# Patient Record
Sex: Female | Born: 1965
Health system: Southern US, Community
[De-identification: ages and names within clinical notes are randomized; demographics above are authoritative.]

## PROBLEM LIST (undated history)

## (undated) DIAGNOSIS — R42 Dizziness and giddiness: Secondary | ICD-10-CM

## (undated) DIAGNOSIS — K219 Gastro-esophageal reflux disease without esophagitis: Secondary | ICD-10-CM

## (undated) DIAGNOSIS — M1990 Unspecified osteoarthritis, unspecified site: Secondary | ICD-10-CM

## (undated) DIAGNOSIS — R112 Nausea with vomiting, unspecified: Secondary | ICD-10-CM

## (undated) DIAGNOSIS — E739 Lactose intolerance, unspecified: Secondary | ICD-10-CM

## (undated) DIAGNOSIS — R634 Abnormal weight loss: Secondary | ICD-10-CM

## (undated) DIAGNOSIS — K5900 Constipation, unspecified: Secondary | ICD-10-CM

## (undated) DIAGNOSIS — E7800 Pure hypercholesterolemia, unspecified: Secondary | ICD-10-CM

## (undated) DIAGNOSIS — K589 Irritable bowel syndrome without diarrhea: Secondary | ICD-10-CM

## (undated) DIAGNOSIS — T7840XA Allergy, unspecified, initial encounter: Secondary | ICD-10-CM

## (undated) DIAGNOSIS — G43909 Migraine, unspecified, not intractable, without status migrainosus: Secondary | ICD-10-CM

## (undated) DIAGNOSIS — F32A Depression, unspecified: Secondary | ICD-10-CM

## (undated) DIAGNOSIS — R7303 Prediabetes: Secondary | ICD-10-CM

## (undated) DIAGNOSIS — R12 Heartburn: Secondary | ICD-10-CM

## (undated) DIAGNOSIS — M549 Dorsalgia, unspecified: Secondary | ICD-10-CM

## (undated) DIAGNOSIS — Z9889 Other specified postprocedural states: Secondary | ICD-10-CM

## (undated) DIAGNOSIS — K259 Gastric ulcer, unspecified as acute or chronic, without hemorrhage or perforation: Secondary | ICD-10-CM

## (undated) DIAGNOSIS — F419 Anxiety disorder, unspecified: Secondary | ICD-10-CM

## (undated) DIAGNOSIS — K3184 Gastroparesis: Secondary | ICD-10-CM

## (undated) HISTORY — DX: Heartburn: R12

## (undated) HISTORY — DX: Unspecified osteoarthritis, unspecified site: M19.90

## (undated) HISTORY — DX: Irritable bowel syndrome, unspecified: K58.9

## (undated) HISTORY — DX: Lactose intolerance, unspecified: E73.9

## (undated) HISTORY — DX: Prediabetes: R73.03

## (undated) HISTORY — DX: Gastro-esophageal reflux disease without esophagitis: K21.9

## (undated) HISTORY — DX: Allergy, unspecified, initial encounter: T78.40XA

## (undated) HISTORY — PX: BACK SURGERY: SHX140

## (undated) HISTORY — PX: TONSILLECTOMY: SUR1361

## (undated) HISTORY — PX: ABDOMINAL HYSTERECTOMY: SHX81

## (undated) HISTORY — DX: Anxiety disorder, unspecified: F41.9

## (undated) HISTORY — DX: Dorsalgia, unspecified: M54.9

## (undated) HISTORY — DX: Depression, unspecified: F32.A

## (undated) HISTORY — DX: Migraine, unspecified, not intractable, without status migrainosus: G43.909

## (undated) HISTORY — DX: Pure hypercholesterolemia, unspecified: E78.00

## (undated) HISTORY — DX: Constipation, unspecified: K59.00

## (undated) HISTORY — DX: Gastroparesis: K31.84

## (undated) HISTORY — DX: Gastric ulcer, unspecified as acute or chronic, without hemorrhage or perforation: K25.9

## (undated) HISTORY — PX: KNEE ARTHROSCOPY W/ ACL RECONSTRUCTION: SHX1858

## (undated) HISTORY — PX: OTHER SURGICAL HISTORY: SHX169

## (undated) HISTORY — PX: COSMETIC SURGERY: SHX468

---

## 1998-01-04 ENCOUNTER — Ambulatory Visit (HOSPITAL_COMMUNITY): Admission: RE | Admit: 1998-01-04 | Discharge: 1998-01-04 | Payer: Self-pay | Admitting: Obstetrics and Gynecology

## 1998-07-28 ENCOUNTER — Ambulatory Visit (HOSPITAL_COMMUNITY): Admission: RE | Admit: 1998-07-28 | Discharge: 1998-07-28 | Payer: Self-pay | Admitting: Internal Medicine

## 1998-07-28 ENCOUNTER — Encounter: Payer: Self-pay | Admitting: Internal Medicine

## 1999-07-17 ENCOUNTER — Encounter: Payer: Self-pay | Admitting: Emergency Medicine

## 1999-07-17 ENCOUNTER — Emergency Department (HOSPITAL_COMMUNITY): Admission: EM | Admit: 1999-07-17 | Discharge: 1999-07-17 | Payer: Self-pay | Admitting: Emergency Medicine

## 1999-09-22 ENCOUNTER — Other Ambulatory Visit: Admission: RE | Admit: 1999-09-22 | Discharge: 1999-09-22 | Payer: Self-pay | Admitting: Obstetrics and Gynecology

## 2000-07-29 ENCOUNTER — Other Ambulatory Visit: Admission: RE | Admit: 2000-07-29 | Discharge: 2000-07-29 | Payer: Self-pay | Admitting: Obstetrics and Gynecology

## 2000-08-23 ENCOUNTER — Encounter: Payer: Self-pay | Admitting: Obstetrics & Gynecology

## 2000-08-23 ENCOUNTER — Ambulatory Visit (HOSPITAL_COMMUNITY): Admission: RE | Admit: 2000-08-23 | Discharge: 2000-08-23 | Payer: Self-pay | Admitting: Obstetrics & Gynecology

## 2000-11-15 ENCOUNTER — Encounter: Payer: Self-pay | Admitting: Obstetrics and Gynecology

## 2000-11-15 ENCOUNTER — Inpatient Hospital Stay (HOSPITAL_COMMUNITY): Admission: AD | Admit: 2000-11-15 | Discharge: 2000-11-15 | Payer: Self-pay | Admitting: Obstetrics and Gynecology

## 2001-01-03 ENCOUNTER — Encounter: Payer: Self-pay | Admitting: *Deleted

## 2001-01-03 ENCOUNTER — Inpatient Hospital Stay (HOSPITAL_COMMUNITY): Admission: AD | Admit: 2001-01-03 | Discharge: 2001-01-03 | Payer: Self-pay | Admitting: *Deleted

## 2001-02-13 ENCOUNTER — Encounter (INDEPENDENT_AMBULATORY_CARE_PROVIDER_SITE_OTHER): Payer: Self-pay | Admitting: Specialist

## 2001-02-13 ENCOUNTER — Inpatient Hospital Stay (HOSPITAL_COMMUNITY): Admission: AD | Admit: 2001-02-13 | Discharge: 2001-02-17 | Payer: Self-pay | Admitting: Obstetrics and Gynecology

## 2001-02-18 ENCOUNTER — Encounter: Admission: RE | Admit: 2001-02-18 | Discharge: 2001-02-27 | Payer: Self-pay | Admitting: Obstetrics and Gynecology

## 2001-03-22 ENCOUNTER — Other Ambulatory Visit: Admission: RE | Admit: 2001-03-22 | Discharge: 2001-03-22 | Payer: Self-pay | Admitting: Obstetrics and Gynecology

## 2002-03-07 ENCOUNTER — Encounter: Admission: RE | Admit: 2002-03-07 | Discharge: 2002-03-07 | Payer: Self-pay | Admitting: Internal Medicine

## 2002-03-07 ENCOUNTER — Encounter: Payer: Self-pay | Admitting: Internal Medicine

## 2002-04-10 ENCOUNTER — Other Ambulatory Visit: Admission: RE | Admit: 2002-04-10 | Discharge: 2002-04-10 | Payer: Self-pay | Admitting: Obstetrics and Gynecology

## 2002-07-12 ENCOUNTER — Encounter (INDEPENDENT_AMBULATORY_CARE_PROVIDER_SITE_OTHER): Payer: Self-pay | Admitting: Specialist

## 2002-07-12 ENCOUNTER — Ambulatory Visit (HOSPITAL_COMMUNITY): Admission: RE | Admit: 2002-07-12 | Discharge: 2002-07-12 | Payer: Self-pay | Admitting: Obstetrics and Gynecology

## 2003-06-03 ENCOUNTER — Other Ambulatory Visit: Admission: RE | Admit: 2003-06-03 | Discharge: 2003-06-03 | Payer: Self-pay | Admitting: Obstetrics and Gynecology

## 2003-06-05 ENCOUNTER — Encounter (INDEPENDENT_AMBULATORY_CARE_PROVIDER_SITE_OTHER): Payer: Self-pay

## 2003-06-06 ENCOUNTER — Inpatient Hospital Stay (HOSPITAL_COMMUNITY): Admission: RE | Admit: 2003-06-06 | Discharge: 2003-06-07 | Payer: Self-pay | Admitting: Obstetrics and Gynecology

## 2004-06-16 ENCOUNTER — Other Ambulatory Visit: Admission: RE | Admit: 2004-06-16 | Discharge: 2004-06-16 | Payer: Self-pay | Admitting: Obstetrics and Gynecology

## 2004-10-06 ENCOUNTER — Ambulatory Visit (HOSPITAL_BASED_OUTPATIENT_CLINIC_OR_DEPARTMENT_OTHER): Admission: RE | Admit: 2004-10-06 | Discharge: 2004-10-06 | Payer: Self-pay | Admitting: Orthopedic Surgery

## 2005-08-20 ENCOUNTER — Other Ambulatory Visit: Admission: RE | Admit: 2005-08-20 | Discharge: 2005-08-20 | Payer: Self-pay | Admitting: Obstetrics and Gynecology

## 2010-07-14 ENCOUNTER — Ambulatory Visit (HOSPITAL_BASED_OUTPATIENT_CLINIC_OR_DEPARTMENT_OTHER): Admission: RE | Admit: 2010-07-14 | Discharge: 2010-07-14 | Payer: Self-pay | Admitting: Orthopedic Surgery

## 2010-12-24 LAB — POCT HEMOGLOBIN-HEMACUE: Hemoglobin: 13.9 g/dL (ref 12.0–15.0)

## 2011-02-26 NOTE — Discharge Summary (Signed)
   [name], BECKA                          ACCOUNT NO.:  [id number]   MEDICAL RECORD NO.:  [id number]                   PATIENT TYPE:  INP   LOCATION:  9135                                 FACILITY:  [facility]   PHYSICIAN:  Juluis Mire, M.D.                DATE OF BIRTH:  January 04, 1966   DATE OF ADMISSION:  06/05/2003  DATE OF DISCHARGE:  06/07/2003                                 DISCHARGE SUMMARY   ADMITTING DIAGNOSES:  Uterine adenomyosis.   DISCHARGE DIAGNOSES:  Uterine adenomyosis with pathology pending.   OPERATIVE PROCEDURE:  Laparoscopically assisted vaginal hysterectomy.   HISTORY:  For complete history and physical please see dictated note.   HOSPITAL COURSE:  The patient underwent laparoscopic assisted vaginal  hysterectomy.  Pathology is still pending at the present time.  Postoperatively her postoperative hemoglobin was 10.7.  She did have nausea  on her first postoperative day that limited her p.o. intake somewhat.  We  watched her that day and despite continued observation she continued to be  slightly nauseated.  We decided we would keep her overnight to follow her GI  function.  The following morning her nausea had resolved.  She was  tolerating her diet and voiding without difficulty.  Her abdomen was soft.  Bowel sounds were active.  She was actually passing flatus.  The  subumbilical and suprapubic incisions were intact.  She had no active  vaginal bleeding.  She was discharged home at that time.   COMPLICATIONS:  None encountered during stay in the hospital.  The patient  was discharged home in stable condition.   DISPOSITION:  Routine postoperative instructions were given.  She is to  avoid heavy lifting, vaginal entrance, or driving a car.  Discharged home on  Demerol as she needs for pain.  She is to call with signs of infection,  nausea, vomiting, increasing abdominal pain, or active vaginal bleeding.  Follow-up in the office will be in one week.                                               Juluis Mire, M.D.    JSM/MEDQ  D:  06/07/2003  T:  06/07/2003  Job:  [id number]

## 2011-02-26 NOTE — H&P (Signed)
NAMEBERNEICE, [name]                          ACCOUNT NO.:  [id number]   MEDICAL RECORD NO.:  [id number]                   PATIENT TYPE:  AMB   LOCATION:  SDC                                  FACILITY:  [facility]   PHYSICIAN:  Juluis Mire, M.D.                DATE OF BIRTH:  1966/02/11   DATE OF ADMISSION:  07/12/2002  DATE OF DISCHARGE:                                HISTORY & PHYSICAL   HISTORY:  The patient is a 45 year old gravida 2, para 2, married white  female who presents for diagnostic laparoscopy with laser standby and  bilateral tubal ligation.  Subsequent hysteroscopy with endometrial  cryoablation.   IN RELATION TO THE PRESENT ADMISSION:  The patient is desirous of permanent  sterilization.  We have discussed alternatives for birth control. The  potential irreversibility of sterilization was discussed.  A failure rate of  1 in 200 was quoted.  This can be in the form of ectopic pregnancy requiring  further surgical management.   The patient has also been having trouble with her menstrual flow.  She has  five days of flow, two days being heavy changing tampons and pads every hour  with clots and increasing dysmenorrhea associated with this.  There is some  pain during deep penetration with intercourse.  She does have a past history  of endometriosis for which she underwent a laparoscopic evaluation in 1999.  She also underwent six months of Lupron therapy.  She underwent a saline  infusion in the office.  There were no endometrial polyps or fibroids noted.  Both ovaries appeared to be normal.  In view of this, we discussed options  including hormonal management with birth control pills versus endometrial  ablation versus hysterectomy.  The patient is going to proceed with  endometrial ablation. At the time of laparoscopy will evaluate for any  recurrent endometriosis.   ALLERGIES:  The patient is allergic to CODEINE.   MEDICATIONS:  Maxalt for migraines.   PAST  MEDICAL HISTORY:  Usual childhood diseases.  No significant sequelae.  Does have a history of migraines.   PAST SURGICAL HISTORY:  As noted in 1999, she underwent hysteroscopy and  laparoscopy upon amendable pelvic endometriosis.  In 1999, she had back  surgery.  In 1985 she had a tonsillectomy.   OBSTETRICAL HISTORY:  She has had one spontaneous vaginal delivery and one  low transverse cesarean section.   FAMILY HISTORY:  Noncontributory.   SOCIAL HISTORY:  No tobacco or alcohol use.   REVIEW OF SYSTEMS:  Noncontributory.   PHYSICAL EXAMINATION:  The patient is afebrile.  Stable vital signs.  HEENT:  The patient is normocephalic.  Pupils equal, round, reactive to  light and accommodation.  Extraocular movements are intact.  Sclerae and  conjunctiva are clear.  Oropharynx clear.  NECK:  Without thyromegaly.  BREASTS:  No discrete masses.  LUNGS:  Clear.  CARDIAC SYSTEMS:  Regular rate and rhythm without murmurs or gallops.  ABDOMINAL EXAM:  Benign.  No masses or organomegaly or tenderness.  PELVIC:  Normal external genitalia.  Vaginal mucosa was clear.  Cervix  unremarkable.  Uterus normal size, shape and contour.  Adnexa free of masses  or tenderness.  RECTOVAGINAL EXAM:  Clear.  EXTREMITIES:  Trace edema.  NEUROLOGIC EXAM:  Grossly within normal limits.   IMPRESSION:  1. Multiparity desirous of sterility.  2. Menorrhagia and dysmenorrhea.  3. History of endometriosis.   PLAN:  The patient is to undergo laparoscopy with bilateral tubal ligation.  Laser will be made available to treat any active endometriosis.  Will then  do hysteroscopy and subsequent endometrial ablation.  Success rate for  endometrial ablation at 70% have been discussed.  The risk of surgery have  been explained including the risk of anesthesia, the risk of an infection,  the risk of an infection, the risk of hemorrhage necessitating transfusion  with the risk of  hepatitis, risk of injury to adjacent  organs could require  exploratory surgery, risk of deep venous thrombosis and pulmonary embolus.  The patient understands indications, risk and alternatives.                                               Juluis Mire, M.D.    JSM/MEDQ  D:  07/12/2002  T:  07/12/2002  Job:  [id number]

## 2011-02-26 NOTE — Op Note (Signed)
[name], FAHS                          ACCOUNT NO.:  [id number]   MEDICAL RECORD NO.:  [id number]                   PATIENT TYPE:  AMB   LOCATION:  SDC                                  FACILITY:  [facility]   PHYSICIAN:  Juluis Mire, M.D.                DATE OF BIRTH:  1966/09/16   DATE OF PROCEDURE:  07/12/2002  DATE OF DISCHARGE:                                 OPERATIVE REPORT   PREOPERATIVE DIAGNOSES:  1. Menorrhagia with dysmenorrhea.  2. Multiparity, desires sterility.   POSTOPERATIVE DIAGNOSES:  1. Menorrhagia with dysmenorrhea.  2. Multiparity, desires sterility.  3. Endometriosis.   OPERATIVE PROCEDURES:  1. Open laparoscopy.  2. Bilateral tubal fulguration.  3. Cautery of endometriotic implants.  4. Cervical dilation.  5. Hysteroscopy with multiple endometrial biopsies.  6. Endometrial cryoablation.   SURGEON:  Juluis Mire, M.D.   ANESTHESIA:  General endotracheal.   ESTIMATED BLOOD LOSS:  Minimal.   PACKS AND DRAINS:  None.   INTRAOPERATIVE BLOOD REPLACEMENT:  None.   COMPLICATIONS:  None.   INDICATIONS FOR PROCEDURE:  Dictated in history and physical.   PROCEDURE AS FOLLOWS:  The patient was taken to the OR, placed in supine  position.  After satisfactory level of  general endotracheal anesthesia was  obtained, the patient was placed in the dorsal lithotomy position using the  Allen stirrups.  The abdomen, perineum, and vagina were prepped out with  Betadine.  The bladder was emptied by catheterization.  A Hulka tenaculum  was put in place and secured.  The patient draped out for laparoscopy.  A  subumbilical incision made with a knife.  The incision was extended to  subcutaneous tissue.  The fascia was identified and entered sharply and the  incision was extended laterally.  Lateral sutures of 0 Vicryl were put in  place and held into the fascia.  The peritoneum was entered.  The inflatable  open laparoscopy was put in place.  The balloon was  inflated with saline and  secured against the skin.  This is also secured with the held sutures.  The  laparoscope was introduced.  There was no evidence of injury to adjacent  organs.  A 5-mm trocar was put in place in the suprapubic area under direct  visualization.  The uterus was upper limits of normal size.  Tubes and  ovaries were unremarkable.  She did have some implants of endometriosis on  the posterior aspect of the uterus.  These were whitish lesions.  There was  no cul-de-sac lesions or adhesions.  Both ovaries were uninvolved.  We could  not see the appendix, thought it may be retrocecal.  The upper abdomen  including liver and tip of the gallbladder was clear.  Using the bipolar,  first areas of endometriosis were ablated on the back of the uterus.  Both  tubes were coagulated for a distance of  2 cm.  Coagulation was continued  until resistance read 0 on the meter.  The same segment of tube was then  recoagulated, completely desiccating the tube.  The coagulation did extend  out to the mesosalpinx.  At this point in time, both tubes were adequately  coagulated along with endometriosis.  There was no evidence of injury to  adjacent organs.  The laparoscope and trocars were removed.  The  subumbilical fascia was closed with two figure-of-eights of 0 Vicryl.  Skin  was closed with interrupted subcuticular of 4-0 Vicryl.  The suprapubic  incision was closed with Steri-Strips.   At this point in time, we proceeded with hysteroscopy.  The Hulka tenaculum  was then removed.  The patient's legs were repositioned.  Speculum was  placed in the vaginal vault.  The cervix was grasped with a single-tooth  tenaculum.  The uterus was dilated to approximately a size 35 Pratt dilator.  The hysteroscope was introduced.  The intrauterine cavity was distended  using Sorbitol.  There was absolutely no evidence of any endometrial  pathology.  No polyps, fibroids, irregularities.  Multiple biopsies  were  obtained and sent for pathological review.  The hysteroscope was then  removed.  Next, we used the cryogen endometrial ablating technique. The  probe was first inserted on the left side of the uterus and frozen for six  minutes.  It was then removed and repositioned on the right side of the  uterus and frozen for six minutes.  I felt that this was adequate as she did  not have a markedly large intrauterine cavity.  She did tolerate this well.  At this point in time, the single-tooth tenaculum was inspected and removed.  The patient taken out of the dorsal lithotomy position.  Once alert and  extubated, transferred to the recovery room in good condition.  Sponge,  instrument, and needle counts reported as correct per circulating nurse x2.                                                Juluis Mire, M.D.    JSM/MEDQ  D:  07/12/2002  T:  07/12/2002  Job:  [id number]

## 2011-02-26 NOTE — Op Note (Signed)
NAMEALEXANDER, MCAULEY                ACCOUNT NO.:  [id number]   MEDICAL RECORD NO.:  [id number]          PATIENT TYPE:  AMB   LOCATION:  DSC                          FACILITY:  [facility]   PHYSICIAN:  Nadara Mustard, MD     DATE OF BIRTH:  04-25-1966   DATE OF PROCEDURE:  10/06/2004  DATE OF DISCHARGE:                                 OPERATIVE REPORT   PREOPERATIVE DIAGNOSIS:  Lisfranc fracture dislocation of right foot.   POSTOPERATIVE DIAGNOSIS:  Lisfranc fracture dislocation of right foot.   PROCEDURES:  Open reduction and internal fixation of right Lisfranc fracture  dislocation.   SURGEON:  Nadara Mustard, M.D.   ANESTHESIA:  Popliteal block plus general.   ESTIMATED BLOOD LOSS:  Minimal.   ANTIBIOTICS:  1 g of Kefzol.   TOURNIQUET TIME:  Esmarch to the ankle for approximately 30 minutes.   DISPOSITION:  To PACU in stable condition.   INDICATIONS FOR PROCEDURE:  The patient is a 45 year old woman who has had a  fracture at the base of the second and third metatarsals, as well as a  displacement of the Lisfranc joint and first metatarsal and medial  cuneiforme.  She has failed conservative care and presents at this time for  surgical intervention.  The risks and benefits were discussed, including  infection, neurovascular injury, persistent pain and need for additional  surgery.  The patient states she understands and wishes to proceed at this  time.   DESCRIPTION OF PROCEDURE:  The patient underwent a popliteal block and then  was brought to OR room #2.  The patient then underwent general anesthetic.  After adequate levels of anesthesia were obtained, the patient's right lower  extremity was prepped using Duraprep and draped into a sterile field.  The  leg was elevated and esmarch was wrapped around the ankle for tourniquet  control.  A dorsal incision was made over the Lisfranc joint.  Blunt  dissection was carried down to the Lisfranc joint.  The fibrinous tissue was  debrided from the Lisfranc joint.  The first metatarsal medial cuneiforme  joint was reduced and held stable with a tenaculum.  A 2.5 mm drill bit was  then used to stabilize the first metatarsal medial cuneiforme joint from  dorsal proximal to distal plantar.  The screw was placed and C-arm  fluoroscopy verified reduction.  A second screw was placed transverse from  the middle to medial cuneiforme to stabilize the middle medial cuneiforme  joint.  A third screw was then placed from medial proximal at the medial  cuneiforme to distal lateral over the second metatarsal to recreate the  Lisfranc ligament disruption.  Screws were stabilized.  C-arm fluoroscopy  verified reduction in both AP and lateral planes.  The wound was irrigated  with normal saline.  The Esmarch was released and hemostasis was obtained.  The incision was closed  using 3-0 nylon with a far-near near-far suture.  The wounds were covered  with Adaptic orthopedic sponges, Webril and a Coban dressing.  The patient  was extubated and taken to the PACU in  stable condition.  The plan is for  discharge to home.  Prescription for [facility] on the chart.  Plan to  follow up in the office in two weeks.      Vernia Buff   MVD/MEDQ  D:  10/06/2004  T:  10/06/2004  Job:  [id number]

## 2011-02-26 NOTE — Discharge Summary (Signed)
[facility]  Patient:    [name], [name]                       MRN: [id number] Adm. Date:  [id number] Disc. Date: 02/17/01 Attending:  Trevor Iha Dictator:   Danie Chandler, R.N.                           Discharge Summary  ADMITTING DIAGNOSES:          1. Intrauterine pregnancy at [redacted] weeks                                  gestation.                               2. Prolonged rupture of membranes.  DISCHARGE DIAGNOSES:          1. Intrauterine pregnancy at [redacted] weeks                                  gestation.                               2. Prolonged rupture of membranes.                               3. Fetal intolerance to labor.                               4. Cord presentation.  PROCEDURE:                    On Feb 14, 2001: Primary low segment transverse                               cesarean section.  REASON FOR ADMISSION:         The patient is a 45 year old, married white female, gravida 2, para 1 at 45 weeks who presented with rupture of membranes before 9 oclock on the morning of Feb 13, 2001. She presented to the office that afternoon where gross rupture of membranes was confirmed. About 6 oclock that evening, the patient was begun on Pitocin because of no sign of contractions. By around 10 oclock, the patient began having repetitive late decelerations which responded to discontinuing the Pitocin, oxygen, and IV fluids. The patient made it to 3-4 cm, 75%, -3 station. Because of the fetal intolerance to labor, the decision was made to proceed with a primary low segment transverse cesarean section.  HOSPITAL COURSE:              The patient was taken to the operating room and underwent the above-named procedure without complication. This was productive of a viable female infant with Apgars of 8 at one minute and 9 at five minutes. There was a loop of cord preceding the fetal head just lateral to the occiput consistent with a cord  presentation.  On postoperative day #1, the patients hemoglobin was 10.7, hematocrit 31.5, and white blood cell  count 14.1. She had a good return of bowel function on this day and was tolerating a regular diet. She also had good pain control with PCA.  On postoperative day #2, the patient was ambulating well without difficulty. She was taking Demerol p.o. for pain, and she was discharged home on postoperative day #4.  CONDITION ON DISCHARGE:       Good.  DIET:                         Regular as tolerated.  ACTIVITY:                     No heavy lifting, no driving, no vaginal entry.  FOLLOWUP:                     She is to follow up in the office in one to two weeks for incision check, and she is to call for temperature greater than 100 degrees, persistent nausea or vomiting, heavy vaginal bleeding, and/or redness or drainage from the incision site.  DISCHARGE MEDICATIONS:        1. Prenatal vitamin one p.o. q.d.                               2. Demerol 50 mg one to two p.o. q.4h.                                  p.r.n. pain. Dispense #30 with no refills. DD:  02/17/01 TD:  02/17/01 Job: [id number]

## 2011-02-26 NOTE — Op Note (Signed)
[facility]  Patient:    [name], [name]                       MRN: [id number] Proc. Date: 02/14/01 Adm. Date:  [id number] Attending:  Trevor Iha                           Operative Report  PREOPERATIVE DIAGNOSIS:       Intrauterine pregnancy at 39 weeks, premature                               rupture of membranes, and fetal intolerance to                               labor.  POSTOPERATIVE DIAGNOSIS:      Intrauterine pregnancy at 39 weeks, premature                               rupture of membranes, and fetal intolerance to                               labor, plus cord presentation.  OPERATION:                    Primary low segment transverse cesarean section.  SURGEON:                      Trevor Iha, M.D.  ANESTHESIA:                   Epidural.  INDICATIONS:                  The patient is a 45 year old gravida 2, para 1, at 19 weeks who presents with rupture of membranes before 9 oclock this morning.  She presented to the office around 4:30 this afternoon, confirmed gross rupture of fluids.  Around 6 p.m. was begun on Pitocin  because of no sign of contractions.  By around 10 oclock, she began having repetitive late decelerations which responded to discontinuing the Pitocin, O2, and IV fluids. She made it to 3-4 cm, 75%, -3 station.  Because of fetal intolerance to labor, proceeded with a primary low segment transverse cesarean section.  The risks and benefits were discussed at length.  Informed consent was obtained.  FINDINGS AT TIME OF SURGERY:  Viable female infant.  Apgars of 8 and 9. Weight was 8 pounds and 3 ounces.  The pH is currently pending.  Loop of cord preceding the fetal head just lateral to the occiput consistent with a cord presentation.  DESCRIPTION OF PROCEDURE:     After adequate analgesia, the patient was placed in the supine position with left lateral tilt.  She was sterilely prepped and draped.   The bladder was sterilely drained with the Foley catheter.  A Pfannenstiel skin incision was made two fingerbreadths above the pubic symphysis which was taken down sharply.  The fascia was incised transversely. It was extended superiorly and inferiorly after the bellies of the rectus muscle were separated sharply in the midline.  The peritoneum was entered sharply.  The uterine serosa was elevated, nicked, and incised transversely. A bladder  flap was created, replaced by the bladder blade.  A low celiomyotomy incision was made down to the infants vertex and extended laterally with the operators fingertips.  The infants vertex was delivered atraumatically.  The nares and pharynx suctioned.  The infant was then delivered.  The cord was clamped and cut, and handed to the pediatricians.  Cord blood was obtained. The placenta was extracted manually.  The uterus was exteriorized, wiped clean with the dry lap.  The myotomy incision was closed in two layers, the first being a running locking layer and the second being an imbricating layer of 0 Monocryl.  After adequate hemostasis was achieved, normal uterus, tubes, and ovaries were noted.  The uterus was placed back into the peritoneal cavity and after a copious amount of irrigation and adequate hemostasis was assured, 0 Monocryl was used to close the peritoneum and plicate the rectus muscles in the midline.  Irrigation was applied and after adequate hemostasis, the fascia was closed in a single layer with 0 Panacryl.  Irrigation was once again applied, and after adequate hemostasis, skin staples and Steri-Strips applied.  The patient tolerated the procedure well, was stable, and transferred to the recovery room.  Sponge, needle, and instrument count was normal x 3.  The patient received 1 g of cefotetan after delivery of the placenta.  Estimated blood loss 800 cc. DD:  02/14/01 TD:  02/14/01 Job: [id number]

## 2011-02-26 NOTE — H&P (Signed)
[name], [name]                          ACCOUNT NO.:  [id number]   MEDICAL RECORD NO.:  [id number]                   PATIENT TYPE:  OBV   LOCATION:  9198                                 FACILITY:  [facility]   PHYSICIAN:  Juluis Mire, M.D.                DATE OF BIRTH:  [birth year]   DATE OF ADMISSION:  06/05/2003  DATE OF DISCHARGE:                                HISTORY & PHYSICAL   HISTORY OF PRESENT ILLNESS:  The patient is a 45 year old, gravida 2, para  2, married white female who presents for laparoscopically-assisted vaginal  hysterectomy.   In relation to the present admission, the patient had been having trouble  with her menstrual flow.  She describes five days of flow, two days of being  heavy, changing pads and tampons every hour with clots and increasing  dysmenorrhea.  Associated with this is increasing pain and discomfort,  particularly during intercourse.   She has had a past history of endometriosis for which she underwent  laparoscopic evaluation in 1999.  She has subsequently been treated with  Lupron therapy.  Subsequently in October 2003, she underwent diagnostic  laparoscopy with bilateral tubal ligation, hysteroscopy and cryoablation of  the endometrium.  At the time of laparoscopy, there was still some  superficial implants of endometriosis and overall uterine enlargement,  possibly consistent with adenomyosis.  Despite the treatment, she continued  to have significant menometrorrhagia that was incapacitating and the patient  now presents for definitive therapy in the form of laparoscopically-assisted  vaginal hysterectomy.  She has tried birth control pills in the past, had to  discontinue these because of side effects.  We have offered her other  options including repeat cryoablation.   ALLERGIES:  She is allergic to CODEINE.   MEDICATIONS:  None at the present time.   PAST MEDICAL HISTORY:  Usual childhood diseases; no significant sequelae;  does  have a history of migraines.   PAST SURGICAL HISTORY:  She underwent laparoscopy in 1999.  She had back  surgery in 1999.  In 1985, she had a tonsillectomy and as noted above in  2003, she underwent laparoscopy, bilateral tubal ligation, hysteroscopy, and  cryoablation.   OBSTETRICAL HISTORY:  She has had one spontaneous vaginal delivery and one  low-transverse cesarean section.   FAMILY HISTORY:  Noncontributory.   SOCIAL HISTORY:  No tobacco or alcohol use.   REVIEW OF SYSTEMS:  Noncontributory.   PHYSICAL EXAMINATION:  GENERAL:  The patient is afebrile with stable vital  signs.  HEENT:  Normocephalic.  Pupils equal, round and reactive to light and  accomodation.  Extraocular movements are intact.  Sclerae and conjunctivae  are clear.  Oropharynx clear.  NECK:  Without thyromegaly.  BREASTS:  No discrete masses.  LUNGS:  Clear.  CARDIOVASCULAR:  Regular rate; no murmurs or gallops.  ABDOMEN:  Benign.  PELVIC:  Normal external genitalia; vaginal mucosa  clear; cervix  unremarkable; uterus upper limits of normal size; adnexa unremarkable.  Rectovaginal examination is clear.  EXTREMITIES:  Trace edema.  NEUROLOGICAL:  Grossly within normal limits.   IMPRESSION:  Continued menorrhagia, dysmenorrhea and pelvic pain, possibly  secondary to uterine adenomyosis unresponsive to conservative management.   PLAN:  The patient is to undergo laparoscopically-assisted vaginal  hysterectomy.  Ovaries will be left in place.  The risks of surgery have  been discussed including the risk of infection.  The risk of hemorrhage that  can necessitate transfusion with the risk of AIDS or hepatitis.  The risk of  injury to adjacent organs could require further exploratory surgery.  The  risk of deep vein thrombosis and pulmonary embolus.  Persistent pain  scenarios have also been discussed despite hysterectomy.                                               Juluis Mire, M.D.    JSM/MEDQ  D:   06/05/2003  T:  06/05/2003  Job:  [id number]

## 2011-02-26 NOTE — Op Note (Signed)
[name], [name]                          ACCOUNT NO.:  [id number]   MEDICAL RECORD NO.:  [id number]                   PATIENT TYPE:  OBV   LOCATION:  9198                                 FACILITY:  [facility]   PHYSICIAN:  Juluis Mire, M.D.                DATE OF BIRTH:  [birth year]   DATE OF PROCEDURE:  06/05/2003  DATE OF DISCHARGE:                                 OPERATIVE REPORT   PREOPERATIVE DIAGNOSIS:  Adenomyosis.   POSTOPERATIVE DIAGNOSIS:  Adenomyosis.   PROCEDURE:  Laparoscopically-assisted vaginal hysterectomy.   SURGEON:  Juluis Mire, M.D.   ASSISTANT:  Duke Salvia. Marcelle Overlie, M.D.   ANESTHESIA:  General endotracheal.   ESTIMATED BLOOD LOSS:  300 mL.   PACKS AND DRAINS:  None.   INTRAOPERATIVE BLOOD REPLACED:  None.   COMPLICATIONS:  None.   INDICATIONS:  Dictated in the history and physical.   DESCRIPTION OF PROCEDURE:  The patient was taken to the OR and placed in the  supine position.  After a satisfactory level of general anesthesia was  obtained, the patient was placed in the dorsal lithotomy position using the  Allen stirrups.  The abdomen, perineum, and vagina were prepped out with  Betadine.  The bladder was emptied by in-and-out catheterization.  A Hulka  tenaculum was put in place and secured.  The patient was draped out for  laparoscopy.  A subumbilical incision was made with a knife and carried  through the subcutaneous tissue.  The fascia was identified and entered  sharply and the incision in the fascia extended laterally.  Two lateral  sutures of 0 Vicryl were put in place in the fascial edge and held.  The  peritoneum was entered.  There was no evidence of injury to adjacent organs.  The open laparoscopic trocar was inserted and secured.  The laparoscope was  introduced.  Visualization revealed no evidence of injury to adjacent  organs.  A 5 mm trocar was put in place in the suprapubic area under direct  visualization.  The uterus was  enlarged.  There was no evidence of active  endometriosis at this point in time, and tubes and ovaries were  unremarkable.  The appendix was visualized and noted to be unremarkable.  The upper abdomen, including the liver and tip of the gallbladder, was  clear.  Using the Gyrus bipolar cautery unit, we divided both round  ligaments, both tubes and mesosalpinx, and both utero-ovarian pedicles.  We  had good hemostasis bilaterally.  At this point in time the abdomen was  deflated of its carbon dioxide and the laparoscope was removed.   The patient's legs were repositioned and the Hulka tenaculum was then  removed.  A weighted speculum was placed in the vaginal vault.  The  posterior lip of the cervix was grasped with a Christella Hartigan tenaculum.  The cul-de-  sac was entered sharply.  Both uterosacral  ligaments were clamped, cut, and  suture ligated with 0 Vicryl.  The reflection of the vaginal mucosa  anteriorly was incised and the bladder was dissected superiorly.  Using the  clamp, cut, and tie technique with suture ligature of 0 Vicryl, the  parametrium was serially separated from the sides of the uterus.  The  vesicouterine space was entered and retractors put in place.  At this point  in time the uterus was clipped, remaining pedicles were clamped and cut, and  the uterus passed off the operative field and held.  Pedicles were secured  with free ties of 0 Vicryl.  A uterosacral plication stitch of 0 Vicryl was  put in place and secured.  The vaginal mucosa was closed in the midline with  interrupted figure-of-eights of 0 Vicryl.  We had excellent hemostasis.  A  Foley was placed to straight drain with retrieval of an adequate amount of  clear urine.  A sponge on a sponge stick was placed in the vaginal vault.   The patient's legs were repositioned.  The abdomen was reinflated with  carbon dioxide and visualization revealed good hemostasis of the vaginal  cuff and ovarian pedicles as we  thoroughly irrigated the pelvis.  At this  point in time the abdomen was deflated of its carbon dioxide and all trocars  removed.  The subumbilical fascia was closed with two figure-of-eights of 0  Vicryl, skin was closed with interrupted subcuticulars of 4-0 Vicryl, the  suprapubic incision was closed with Steri-Strips.  The sponge on a sponge  stick was removed from the vaginal vault.  Urine output remained clear and  adequate.  Sponge, instrument, and needle count reported as correct by the  circulating nurse x2.  The patient tolerated the procedure well and once  extubated, was transferred to the recovery room in good condition.                                               Juluis Mire, M.D.    JSM/MEDQ  D:  06/05/2003  T:  06/05/2003  Job:  [id number]

## 2011-10-21 ENCOUNTER — Other Ambulatory Visit: Payer: Self-pay | Admitting: Orthopedic Surgery

## 2011-10-21 DIAGNOSIS — R52 Pain, unspecified: Secondary | ICD-10-CM

## 2011-10-27 ENCOUNTER — Ambulatory Visit
Admission: RE | Admit: 2011-10-27 | Discharge: 2011-10-27 | Disposition: A | Discharge status: Home / Self Care | Payer: 59 | Source: Ambulatory Visit | Attending: Orthopedic Surgery | Admitting: Orthopedic Surgery

## 2011-10-27 DIAGNOSIS — R52 Pain, unspecified: Secondary | ICD-10-CM

## 2011-10-27 MED ORDER — IOHEXOL 300 MG/ML  SOLN
100.0000 mL | Freq: Once | INTRAMUSCULAR | Status: AC | PRN
  Administered 2011-10-27: 100 mL via INTRAVENOUS

## 2012-07-02 ENCOUNTER — Other Ambulatory Visit: Payer: Self-pay | Admitting: Family Medicine

## 2012-07-02 ENCOUNTER — Ambulatory Visit: Payer: 59

## 2012-07-02 ENCOUNTER — Ambulatory Visit (INDEPENDENT_AMBULATORY_CARE_PROVIDER_SITE_OTHER): Payer: 59 | Admitting: Family Medicine

## 2012-07-02 DIAGNOSIS — M542 Cervicalgia: Secondary | ICD-10-CM

## 2012-07-02 DIAGNOSIS — Z720 Tobacco use: Secondary | ICD-10-CM | POA: Insufficient documentation

## 2012-07-02 DIAGNOSIS — G43909 Migraine, unspecified, not intractable, without status migrainosus: Secondary | ICD-10-CM | POA: Insufficient documentation

## 2012-07-02 MED ORDER — TRAMADOL HCL 50 MG PO TABS: 50.0000 mg | ORAL_TABLET | Freq: Three times a day (TID) | ORAL | Status: DC | PRN
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
## 2012-07-27 ENCOUNTER — Ambulatory Visit (INDEPENDENT_AMBULATORY_CARE_PROVIDER_SITE_OTHER): Payer: Managed Care, Other (non HMO) | Admitting: Family Medicine

## 2012-07-27 ENCOUNTER — Encounter: Payer: Self-pay | Admitting: Family Medicine

## 2012-07-27 ENCOUNTER — Ambulatory Visit: Payer: Managed Care, Other (non HMO)

## 2012-07-27 VITALS — BP 112/68 | HR 74 | Temp 97.6°F | Resp 16 | Ht 64.0 in | Wt 158.4 lb

## 2012-07-27 DIAGNOSIS — Z131 Encounter for screening for diabetes mellitus: Secondary | ICD-10-CM

## 2012-07-27 DIAGNOSIS — R109 Unspecified abdominal pain: Secondary | ICD-10-CM

## 2012-07-27 DIAGNOSIS — R5383 Other fatigue: Secondary | ICD-10-CM

## 2012-07-27 DIAGNOSIS — R300 Dysuria: Secondary | ICD-10-CM

## 2012-07-27 DIAGNOSIS — R5381 Other malaise: Secondary | ICD-10-CM

## 2012-07-27 DIAGNOSIS — R634 Abnormal weight loss: Secondary | ICD-10-CM

## 2012-07-27 DIAGNOSIS — R112 Nausea with vomiting, unspecified: Secondary | ICD-10-CM

## 2012-07-27 LAB — COMPREHENSIVE METABOLIC PANEL WITH GFR
ALT: 9 U/L (ref 0–35)
Albumin: 4.8 g/dL (ref 3.5–5.2)
CO2: 22 meq/L (ref 19–32)
Calcium: 9.8 mg/dL (ref 8.4–10.5)
Chloride: 109 meq/L (ref 96–112)
Creat: 0.83 mg/dL (ref 0.50–1.10)
Potassium: 4.1 meq/L (ref 3.5–5.3)
Sodium: 140 meq/L (ref 135–145)
Total Protein: 6.9 g/dL (ref 6.0–8.3)

## 2012-07-27 LAB — POCT URINALYSIS DIPSTICK
Bilirubin, UA: NEGATIVE
Blood, UA: NEGATIVE
Glucose, UA: NEGATIVE
Ketones, UA: NEGATIVE
Leukocytes, UA: NEGATIVE
Nitrite, UA: NEGATIVE
Protein, UA: NEGATIVE
Spec Grav, UA: 1.025
Urobilinogen, UA: 0.2
pH, UA: 6

## 2012-07-27 LAB — POCT CBC
Granulocyte percent: 59.9 % (ref 37–80)
HCT, POC: 46.4 % (ref 37.7–47.9)
Hemoglobin: 14.4 g/dL (ref 12.2–16.2)
Lymph, poc: 2.3 (ref 0.6–3.4)
MCH, POC: 32.1 pg — AB (ref 27–31.2)
MCHC: 31 g/dL — AB (ref 31.8–35.4)
MCV: 103.4 fL — AB (ref 80–97)
MID (cbc): 0.4 (ref 0–0.9)
MPV: 8.5 fL (ref 0–99.8)
POC Granulocyte: 4.1 (ref 2–6.9)
POC LYMPH PERCENT: 34.2 % (ref 10–50)
POC MID %: 5.9 %M (ref 0–12)
Platelet Count, POC: 291 10*3/uL (ref 142–424)
RBC: 4.49 M/uL (ref 4.04–5.48)
RDW, POC: 12.2 %
WBC: 6.8 10*3/uL (ref 4.6–10.2)

## 2012-07-27 LAB — TSH: TSH: 0.569 u[IU]/mL (ref 0.350–4.500)

## 2012-07-27 LAB — COMPREHENSIVE METABOLIC PANEL
AST: 12 U/L (ref 0–37)
Alkaline Phosphatase: 84 U/L (ref 39–117)
BUN: 10 mg/dL (ref 6–23)
Glucose, Bld: 100 mg/dL — ABNORMAL HIGH (ref 70–99)
Total Bilirubin: 0.5 mg/dL (ref 0.3–1.2)

## 2012-07-27 LAB — POCT UA - MICROSCOPIC ONLY
Bacteria, U Microscopic: NEGATIVE
Casts, Ur, LPF, POC: NEGATIVE
Crystals, Ur, HPF, POC: NEGATIVE
Epithelial cells, urine per micros: NEGATIVE
Mucus, UA: NEGATIVE
RBC, urine, microscopic: NEGATIVE
WBC, Ur, HPF, POC: NEGATIVE
Yeast, UA: NEGATIVE

## 2012-07-27 LAB — FOLATE: Folate: 16.4 ng/mL

## 2012-07-27 LAB — LIPASE: Lipase: 12 U/L (ref 0–75)

## 2012-07-27 LAB — POCT GLYCOSYLATED HEMOGLOBIN (HGB A1C): Hemoglobin A1C: 4.7

## 2012-07-27 LAB — AMYLASE: Amylase: 22 U/L (ref 0–105)

## 2012-07-27 LAB — VITAMIN B12: Vitamin B-12: 356 pg/mL (ref 211–911)

## 2012-07-27 MED ORDER — ONDANSETRON 4 MG PO TBDP: 4.0000 mg | ORAL_TABLET | Freq: Three times a day (TID) | ORAL | Status: DC | PRN

## 2012-07-27 NOTE — Progress Notes (Signed)
[facility]:  Office Visit  Chief Complaint:  Chief Complaint  Patient presents with  . Headache    behind  [facility] Eye x 3 days  and lasted 1 1/2 day  . Nausea    x end  of August ,  . Weight Loss    Appetite loss    HPI: [name] is a 46 y.o. female who complains of:  Multiple complaints but her biggest one is that she has had chronic nausea and unintentional weight loss. She was intentionally losing weight prior to this all happening but at the beginning of August she was constantly nauseated and this has decreased her appetite. It does not matter what type of food she eats.  Yesterday she had vomiting with this. Normally she does not vomit. Denies recent travels, camping, diarrhea. Denies GERD, Denies eating disorder, she admits to having a lot of stress but she has had this for a long time. In August she was 178 and today she is 154. She has not been losing weight intentionally. She was 6 lbs heavier 2 weeks ago. Has diffuse abd pain "pressure, discomfort" in the middle of abd. She is going to the bathroom daily but she has small pellets. She may be constipated but even when she is not she has nausea. She denies having fevers, chills, night sweats, LAD with this. She denies food allergies, she denies excessive alcohol consumption use or a hx of pancreatitis, denies a hx of gallbladder disease. Denies having a h/o IBS, IBD, Crohns, UC  in family. Had an abdominalplasty after weight loss. Has had hysterectomy. No other abdominal surgeries.   Past Medical History  Diagnosis Date  . Migraine    Past Surgical History  Procedure Date  . Brachiplasty     s/p weight loss   . Abdominal hysterectomy   . Abdominalplasty     s/p weight loss   History   Social History  . Marital Status: Married    Spouse Name: N/A    Number of Children: N/A  . Years of Education: N/A   Social History Main Topics  . Smoking status: Light Tobacco Smoker  . Smokeless tobacco: None    . Alcohol Use: None  . Drug Use: None  . Sexually Active: None   Other Topics Concern  . None   Social History Narrative  . None   Family History  Problem Relation Age of Onset  . Diabetes Mother   . Hypertension Mother   . Thyroid disease Mother   . Cancer Father   . Diabetes Father   . Hypertension Father   . Thyroid disease Sister   . Diabetes Brother    Allergies  Allergen Reactions  . Codeine     Vomiting    Prior to Admission medications   Medication Sig Start Date End Date Taking? Authorizing Provider  buPROPion (WELLBUTRIN) 100 MG tablet Take 100 mg by mouth once.   Yes Historical Provider, MD  Melatonin 5 MG CAPS Take by mouth.   Yes Historical Provider, MD  rizatriptan (MAXALT) 10 MG tablet Take 10 mg by mouth as needed. May repeat in 2 hours if needed   Yes Historical Provider, MD  topiramate (TOPAMAX) 100 MG tablet Take 100 mg by mouth 3 (three) times daily.   Yes Historical Provider, MD  cyclobenzaprine (FLEXERIL) 10 MG tablet Take 10 mg by mouth 3 (three) times daily as needed.    Historical Provider, MD  traMADol Janean Sark) 50  MG tablet Take 1 tablet (50 mg total) by mouth every 8 (eight) hours as needed for pain. 07/02/12   Gwenlyn Found Copland, MD     ROS: The patient denies fevers, chills, night sweats,chest pain, palpitations, wheezing, dyspnea on exertion, hematuria, melena, numbness, weakness, or tingling.   All other systems have been reviewed and were otherwise negative with the exception of those mentioned in the HPI and as above.    PHYSICAL EXAM: Filed Vitals:   07/27/12 1247  BP: 112/68  Pulse: 74  Temp: 97.6 F (36.4 C)  Resp: 16   Filed Vitals:   07/27/12 1247  Height: 5\' 4"  (1.626 m)  Weight: 158 lb 6.4 oz (71.85 kg)   Body mass index is 27.19 kg/(m^2).  General: Alert, no acute distress HEENT:  Normocephalic, atraumatic, oropharynx patent. Tm nl. EOMI, PERRLA, No exudates Cardiovascular:  Regular rate and rhythm, no rubs murmurs  or gallops.  No Carotid bruits, radial pulse intact. No pedal edema.  Respiratory: Clear to auscultation bilaterally.  No wheezes, rales, or rhonchi.  No cyanosis, no use of accessory musculature GI: No organomegaly, abdomen is soft and non-tender, positive bowel sounds.  No masses. Skin: No rashes. Neurologic: Facial musculature symmetric. Psychiatric: Patient is appropriate throughout our interaction. Lymphatic: No cervical lymphadenopathy Musculoskeletal: Gait intact.   LABS: Results for orders placed in visit on 07/27/12  POCT CBC      Component Value Range   WBC 6.8  4.6 - 10.2 K/uL   Lymph, poc 2.3  0.6 - 3.4   POC LYMPH PERCENT 34.2  10 - 50 %L   MID (cbc) 0.4  0 - 0.9   POC MID % 5.9  0 - 12 %M   POC Granulocyte 4.1  2 - 6.9   Granulocyte percent 59.9  37 - 80 %G   RBC 4.49  4.04 - 5.48 M/uL   Hemoglobin 14.4  12.2 - 16.2 g/dL   HCT, POC 16.1  09.6 - 47.9 %   MCV 103.4 (*) 80 - 97 fL   MCH, POC 32.1 (*) 27 - 31.2 pg   MCHC 31.0 (*) 31.8 - 35.4 g/dL   RDW, POC 04.5     Platelet Count, POC 291  142 - 424 K/uL   MPV 8.5  0 - 99.8 fL  POCT GLYCOSYLATED HEMOGLOBIN (HGB A1C)      Component Value Range   Hemoglobin A1C 4.7    POCT UA - MICROSCOPIC ONLY      Component Value Range   WBC, Ur, HPF, POC neg     RBC, urine, microscopic neg     Bacteria, U Microscopic neg     Mucus, UA neg     Epithelial cells, urine per micros neg     Crystals, Ur, HPF, POC neg     Casts, Ur, LPF, POC neg     Yeast, UA neg    POCT URINALYSIS DIPSTICK      Component Value Range   Color, UA yellow     Clarity, UA clear     Glucose, UA neg     Bilirubin, UA neg     Ketones, UA neg     Spec Grav, UA 1.025     Blood, UA neg     pH, UA 6.0     Protein, UA neg     Urobilinogen, UA 0.2     Nitrite, UA neg     Leukocytes, UA Negative     EKG/XRAY:  Primary read interpreted by Dr. Conley Rolls at [facility]. Nonobstructive gas patterns, no free air   ASSESSMENT/PLAN: Encounter Diagnoses  Name  Primary?  . Nausea & vomiting Yes  . Fatigue   . Weight loss, non-intentional   . Screening for diabetes mellitus   . Abdominal  pain, other specified site   . Dysuria    Labs and xray above are normal.  Other labs pending If labs normal and fail PPI trial and or no relief with zofran Then will refer her to GI.     Hendel Gatliff PHUONG, DO 07/28/2012 11:47 AM

## 2012-07-28 ENCOUNTER — Encounter: Payer: Self-pay | Admitting: Family Medicine

## 2012-08-02 ENCOUNTER — Other Ambulatory Visit: Payer: Self-pay | Admitting: Family Medicine

## 2012-08-02 ENCOUNTER — Telehealth: Payer: Self-pay

## 2012-08-02 ENCOUNTER — Telehealth: Payer: Self-pay | Admitting: Family Medicine

## 2012-08-02 DIAGNOSIS — R110 Nausea: Secondary | ICD-10-CM

## 2012-08-02 NOTE — Telephone Encounter (Signed)
Dr. Conley Rolls, did you want to refer to GI? I don't see a referral request.

## 2012-08-02 NOTE — Telephone Encounter (Signed)
Spoke with patient-gave her message

## 2012-08-02 NOTE — Telephone Encounter (Signed)
PT STATES DR LE WAS TO SET HER UP A REFERRAL TO A STOMACH DR AND SHE HASN'T HEARD FROM ANYONE. PLEASE CALL [phone]

## 2012-08-03 ENCOUNTER — Other Ambulatory Visit: Payer: Self-pay | Admitting: Gastroenterology

## 2012-08-03 DIAGNOSIS — R634 Abnormal weight loss: Secondary | ICD-10-CM

## 2012-08-03 DIAGNOSIS — R110 Nausea: Secondary | ICD-10-CM

## 2012-08-04 ENCOUNTER — Other Ambulatory Visit: Payer: Self-pay

## 2012-08-07 ENCOUNTER — Ambulatory Visit
Admission: RE | Admit: 2012-08-07 | Discharge: 2012-08-07 | Disposition: A | Discharge status: Home / Self Care | Payer: PRIVATE HEALTH INSURANCE | Source: Ambulatory Visit | Attending: Gastroenterology | Admitting: Gastroenterology

## 2012-08-07 DIAGNOSIS — R110 Nausea: Secondary | ICD-10-CM

## 2012-08-07 DIAGNOSIS — R634 Abnormal weight loss: Secondary | ICD-10-CM

## 2012-08-07 MED ORDER — IOHEXOL 300 MG/ML  SOLN
100.0000 mL | Freq: Once | INTRAMUSCULAR | Status: AC | PRN
  Administered 2012-08-07: 100 mL via INTRAVENOUS

## 2012-08-09 ENCOUNTER — Other Ambulatory Visit: Payer: Self-pay | Admitting: Gastroenterology

## 2012-08-09 DIAGNOSIS — R110 Nausea: Secondary | ICD-10-CM

## 2012-08-15 ENCOUNTER — Encounter (HOSPITAL_COMMUNITY): Payer: Self-pay | Admitting: *Deleted

## 2012-08-15 ENCOUNTER — Emergency Department (HOSPITAL_COMMUNITY)
Admission: EM | Admit: 2012-08-15 | Discharge: 2012-08-15 | Disposition: A | Discharge status: Home / Self Care | Payer: 59 | Attending: Emergency Medicine | Admitting: Emergency Medicine

## 2012-08-15 DIAGNOSIS — Z79899 Other long term (current) drug therapy: Secondary | ICD-10-CM | POA: Insufficient documentation

## 2012-08-15 DIAGNOSIS — R5381 Other malaise: Secondary | ICD-10-CM | POA: Insufficient documentation

## 2012-08-15 DIAGNOSIS — E876 Hypokalemia: Secondary | ICD-10-CM

## 2012-08-15 DIAGNOSIS — F17200 Nicotine dependence, unspecified, uncomplicated: Secondary | ICD-10-CM | POA: Insufficient documentation

## 2012-08-15 DIAGNOSIS — R630 Anorexia: Secondary | ICD-10-CM

## 2012-08-15 DIAGNOSIS — G43909 Migraine, unspecified, not intractable, without status migrainosus: Secondary | ICD-10-CM | POA: Insufficient documentation

## 2012-08-15 DIAGNOSIS — R109 Unspecified abdominal pain: Secondary | ICD-10-CM | POA: Insufficient documentation

## 2012-08-15 DIAGNOSIS — R634 Abnormal weight loss: Secondary | ICD-10-CM

## 2012-08-15 DIAGNOSIS — R197 Diarrhea, unspecified: Secondary | ICD-10-CM | POA: Insufficient documentation

## 2012-08-15 DIAGNOSIS — K625 Hemorrhage of anus and rectum: Secondary | ICD-10-CM

## 2012-08-15 DIAGNOSIS — R112 Nausea with vomiting, unspecified: Secondary | ICD-10-CM | POA: Insufficient documentation

## 2012-08-15 LAB — COMPREHENSIVE METABOLIC PANEL
Albumin: 4.4 g/dL (ref 3.5–5.2)
BUN: 8 mg/dL (ref 6–23)
Calcium: 9.8 mg/dL (ref 8.4–10.5)
Creatinine, Ser: 0.88 mg/dL (ref 0.50–1.10)
Total Bilirubin: 0.5 mg/dL (ref 0.3–1.2)
Total Protein: 7.6 g/dL (ref 6.0–8.3)

## 2012-08-15 LAB — TSH: TSH: 1.373 u[IU]/mL (ref 0.350–4.500)

## 2012-08-15 LAB — OCCULT BLOOD, POC DEVICE: Fecal Occult Bld: POSITIVE

## 2012-08-15 LAB — CBC
HCT: 43.6 % (ref 36.0–46.0)
MCH: 34.1 pg — ABNORMAL HIGH (ref 26.0–34.0)
MCHC: 35.3 g/dL (ref 30.0–36.0)
MCV: 96.5 fL (ref 78.0–100.0)
Platelets: 192 10*3/uL (ref 150–400)
RDW: 12.2 % (ref 11.5–15.5)

## 2012-08-15 LAB — LIPASE, BLOOD: Lipase: 25 U/L (ref 11–59)

## 2012-08-15 MED ORDER — POTASSIUM CHLORIDE CRYS ER 20 MEQ PO TBCR
40.0000 meq | EXTENDED_RELEASE_TABLET | Freq: Once | ORAL | Status: AC
Start: 2012-08-15 — End: 2012-08-15
  Administered 2012-08-15: 40 meq via ORAL
  Filled 2012-08-15: qty 2

## 2012-08-15 MED ORDER — SODIUM CHLORIDE 0.9 % IV BOLUS (SEPSIS)
1000.0000 mL | INTRAVENOUS | Status: AC
  Administered 2012-08-15: 1000 mL via INTRAVENOUS

## 2012-08-15 NOTE — ED Notes (Signed)
Pt unable to void at the time. Will try again later.

## 2012-08-15 NOTE — ED Notes (Signed)
Since oct 1st unable to eat and decreased appetite.  Pt started losing weight and saw dr. Loreta Ave and here with hypotension, nauseated, and Sunday nite drank Boost and woke up with abdominal cramping with diarrhea and then started having bright red rectal bleeding.  Pt continues to have rectal bleeding.

## 2012-08-15 NOTE — ED Notes (Signed)
Pt presents to department for evaluation of rectal bleeding, nausea/vomiting and diarrhea. Onset of rectal bleeding Sunday night with severe abdominal cramping, recently had upper GI study and CT of abdomen, was negative. Denies pain at the time. States "I feel nauseated all the time." Unable to keep down food/fluids. She is conscious alert and oriented x4.

## 2012-08-15 NOTE — ED Provider Notes (Signed)
History     CSN: [id number]  Arrival date & time 08/15/12  1002   First MD Initiated Contact with Patient 08/15/12 1014      Chief Complaint  Patient presents with  . Rectal Bleeding    (Consider location/radiation/quality/duration/timing/severity/associated sxs/prior treatment) HPI Comments: [name] presents ambulatory with her husband for evaluation.  She states she became nauseated while trying to eat a meal on Oct 1.  Since then she has had intermittent nausea, abdominal cramping and discomfort.  She has had over 30 lbs of weight loss.  She was seen a t a local physician's office and referred for evaluation by a GI specialist.  Basic labs, upper endoscopy, and an abdominal CT were ordered.  Per report, no significant abnormalities were encountered.  Today she had an office visit with Dr. Loreta Ave.  She was found to be hypotensive and 8-9 lbs lighter than when she was seen about 10 days ago.  She states that she is now having loose stools and there has been bright red blood in her stools.  She denies any abdominal or rectal pain.  She states she has no prior history of GI bleeds.  She denies using diet aids, stimulants, alcohol, illicit drugs, and also binging and purging.  She also denies new stressors, depression, and anxiety.  She has had no night sweats, palpitations, hematuria, or severe fatigue.    The history is provided by the patient. No language interpreter was used.    Past Medical History  Diagnosis Date  . Migraine     Past Surgical History  Procedure Date  . Brachiplasty     s/p weight loss   . Abdominal hysterectomy   . Abdominalplasty     s/p weight loss    Family History  Problem Relation Age of Onset  . Diabetes Mother   . Hypertension Mother   . Thyroid disease Mother   . Cancer Father   . Diabetes Father   . Hypertension Father   . Thyroid disease Sister   . Diabetes Brother     History  Substance Use Topics  . Smoking status: Light Tobacco  Smoker  . Smokeless tobacco: Not on file  . Alcohol Use: No    OB History    Grav Para Term Preterm Abortions TAB SAB Ect Mult Living                  Review of Systems  Constitutional: Positive for appetite change and fatigue. Negative for fever, chills, diaphoresis and activity change.  HENT: Negative for nosebleeds, congestion, sore throat, facial swelling, rhinorrhea, mouth sores, trouble swallowing, neck pain, neck stiffness and dental problem.   Eyes: Negative for visual disturbance.  Respiratory: Negative for cough, shortness of breath and wheezing.   Cardiovascular: Negative for chest pain, palpitations and leg swelling.  Gastrointestinal: Positive for nausea, vomiting, abdominal pain (postprandial cramping), diarrhea and blood in stool (BRB in toilet.  Denies new or swollen hemorrhoids). Negative for constipation, abdominal distention and rectal pain.  Genitourinary: Negative for dysuria, urgency, frequency, hematuria, flank pain, enuresis, difficulty urinating, menstrual problem and pelvic pain.  Musculoskeletal: Negative for myalgias, back pain, joint swelling, arthralgias and gait problem.  Skin: Negative for color change, pallor and wound.  Neurological: Negative for dizziness, syncope, weakness, light-headedness and headaches.  Hematological: Negative for adenopathy. Does not bruise/bleed easily.  Psychiatric/Behavioral: Negative for self-injury, dysphoric mood and decreased concentration. The patient is not nervous/anxious.     Allergies  Codeine  Home  Medications   Current Outpatient Rx  Name  Route  Sig  Dispense  Refill  . BUPROPION HCL 100 MG PO TABS   Oral   Take 100 mg by mouth once.         . CYCLOBENZAPRINE HCL 10 MG PO TABS   Oral   Take 10 mg by mouth 3 (three) times daily as needed.         Marland Kitchen MELATONIN 5 MG PO CAPS   Oral   Take by mouth.         . ONDANSETRON 4 MG PO TBDP   Oral   Take 1 tablet (4 mg total) by mouth every 8 (eight) hours  as needed for nausea.   20 tablet   0   . RIZATRIPTAN BENZOATE 10 MG PO TABS   Oral   Take 10 mg by mouth as needed. May repeat in 2 hours if needed         . TOPIRAMATE 100 MG PO TABS   Oral   Take 100 mg by mouth 3 (three) times daily.         . TRAMADOL HCL 50 MG PO TABS   Oral   Take 1 tablet (50 mg total) by mouth every 8 (eight) hours as needed for pain.   30 tablet   0     BP 98/57  Pulse 97  Temp 97.6 F (36.4 C) (Oral)  Resp 20  SpO2 95%  Physical Exam  Nursing note and vitals reviewed. Constitutional: She is oriented to person, place, and time. She appears well-developed and well-nourished. No distress.  HENT:  Head: Normocephalic and atraumatic. Head is without raccoon's eyes, without Battle's sign, without right periorbital erythema and without left periorbital erythema. No trismus in the jaw.  Right Ear: External ear normal.  Left Ear: External ear normal.  Nose: Nose normal. No rhinorrhea or sinus tenderness. Right sinus exhibits no maxillary sinus tenderness and no frontal sinus tenderness. Left sinus exhibits no maxillary sinus tenderness and no frontal sinus tenderness.  Mouth/Throat: Uvula is midline, oropharynx is clear and moist and mucous membranes are normal. Mucous membranes are not pale, not dry and not cyanotic. She does not have dentures. No oral lesions. Normal dentition. No dental abscesses, uvula swelling, lacerations or dental caries. No oropharyngeal exudate, posterior oropharyngeal edema, posterior oropharyngeal erythema or tonsillar abscesses.  Eyes: Conjunctivae normal are normal. Pupils are equal, round, and reactive to light. Right eye exhibits no discharge. Left eye exhibits no discharge. No scleral icterus.  Neck: Normal range of motion. Neck supple. No JVD present. No tracheal deviation present. No thyromegaly present.  Cardiovascular: Normal rate, regular rhythm, normal heart sounds and intact distal pulses.  Exam reveals no gallop and  no friction rub.   No murmur heard. Pulmonary/Chest: Effort normal and breath sounds normal. No stridor. No respiratory distress. She has no wheezes. She has no rales. She exhibits no tenderness.  Abdominal: Soft. Normal appearance and bowel sounds are normal. She exhibits no shifting dullness, no distension, no abdominal bruit, no ascites, no pulsatile midline mass and no mass. There is no hepatosplenomegaly. There is no tenderness. There is no rebound, no guarding and no CVA tenderness. No hernia.  Genitourinary: Rectal exam shows no external hemorrhoid, no internal hemorrhoid, no fissure, no mass, no tenderness and anal tone normal. Guaiac positive stool.  Musculoskeletal: Normal range of motion. She exhibits no edema and no tenderness.  Lymphadenopathy:    She has no cervical adenopathy.  Neurological: She is alert and oriented to person, place, and time. She has normal strength. She displays no atrophy. No cranial nerve deficit. She exhibits normal muscle tone. She displays no seizure activity. Coordination normal. GCS eye subscore is 4. GCS verbal subscore is 5. GCS motor subscore is 6. She displays no Babinski's sign on the right side. She displays no Babinski's sign on the left side.  Skin: Skin is warm, dry and intact. No abrasion, no bruising, no burn, no ecchymosis, no laceration, no lesion, no petechiae and no rash noted. Rash is not papular and not maculopapular. She is not diaphoretic. No cyanosis or erythema. No pallor. Nails show no clubbing.       No scars on fingers or pitting of fingernails.  Psychiatric: She has a normal mood and affect. Her behavior is normal. Judgment normal. Her mood appears not anxious. Her affect is not angry, not blunt, not labile and not inappropriate. Her speech is not rapid and/or pressured, not delayed, not tangential and not slurred. She is not agitated, not aggressive, is not hyperactive, not withdrawn, not actively hallucinating and not combative.  Cognition and memory are normal. Cognition and memory are not impaired. She does not express impulsivity or inappropriate judgment. She does not exhibit a depressed mood. She is communicative. She exhibits normal recent memory and normal remote memory. She is attentive.    ED Course  Procedures (including critical care time)   Labs Reviewed  CBC  COMPREHENSIVE METABOLIC PANEL  URINALYSIS, ROUTINE W REFLEX MICROSCOPIC  PREGNANCY, URINE  LIPASE, BLOOD  TSH   No results found.   No diagnosis found.    MDM  Pt presents for evaluation of nausea and anorexia.  She has experienced greater than 30 lbs of weight loss.  She denies any pain but has now developed bloody stools.  Will obtain basic labs and perform rectal exam.  The etiology of her anorexia is unclear.  She has had a recent endoscopy and adominal CT scan.  She has also been seen by Dr. Loreta Ave (GI).  Will review results as available and provide an appropriate disposition.  She currently appears nontoxic.  1430.  Pt had borderline low BP on arrival but on orthostatic vital signs the only abnormality is a 20 bpm increase in her HR.  She is awake and alert.  Note mild hypokalemia on the BMP.  She has nl LFTs including a normal serum albumin.  Lipase is normal and she is not anemic.  There is no gross blood on the rectal exam and there was no visible stool on the exam.  The smear was positive for occult blood.    I discussed her evaluation with Dr. Loreta Ave (GI) and individually with her husband.  The previously performed endoscopy and CT scan of the abdomen and pelvis have no abnormal findings that would explain her current constellation of symptoms.  She does not clinically appear depressed and has no prior hx of a eating disorder.  She is also not of the typical age group or demographic for a new onset eating disorder.  IVF have been administered as well as po potassium.  She will be discharged home to follow-up immediately with Dr. Loreta Ave.  She  will order a gastric emptying study and likely also perform a colonoscopy.        Tobin Chad, MD 08/15/12 1450

## 2012-08-16 ENCOUNTER — Other Ambulatory Visit: Payer: Self-pay | Admitting: Gastroenterology

## 2012-08-16 DIAGNOSIS — R110 Nausea: Secondary | ICD-10-CM

## 2012-08-17 ENCOUNTER — Encounter (HOSPITAL_COMMUNITY)
Admission: RE | Admit: 2012-08-17 | Discharge: 2012-08-17 | Disposition: A | Discharge status: Home / Self Care | Payer: 59 | Source: Ambulatory Visit | Attending: Gastroenterology | Admitting: Gastroenterology

## 2012-08-17 ENCOUNTER — Ambulatory Visit (HOSPITAL_COMMUNITY)
Admission: RE | Admit: 2012-08-17 | Discharge: 2012-08-17 | Disposition: A | Discharge status: Home / Self Care | Payer: 59 | Source: Ambulatory Visit | Attending: Gastroenterology | Admitting: Gastroenterology

## 2012-08-17 DIAGNOSIS — D3000 Benign neoplasm of unspecified kidney: Secondary | ICD-10-CM | POA: Insufficient documentation

## 2012-08-17 DIAGNOSIS — R110 Nausea: Secondary | ICD-10-CM

## 2012-08-17 DIAGNOSIS — D1809 Hemangioma of other sites: Secondary | ICD-10-CM | POA: Insufficient documentation

## 2012-08-17 DIAGNOSIS — K828 Other specified diseases of gallbladder: Secondary | ICD-10-CM | POA: Insufficient documentation

## 2012-08-17 MED ORDER — TECHNETIUM TC 99M MEBROFENIN IV KIT
5.0000 | PACK | Freq: Once | INTRAVENOUS | Status: AC | PRN
  Administered 2012-08-17: 5 via INTRAVENOUS

## 2012-08-17 MED ORDER — SINCALIDE 5 MCG IJ SOLR
0.0200 ug/kg | Freq: Once | INTRAMUSCULAR | Status: AC
  Administered 2012-08-17: 1.4 ug via INTRAVENOUS

## 2012-08-21 HISTORY — PX: COLONOSCOPY: SHX174

## 2012-08-22 ENCOUNTER — Encounter (INDEPENDENT_AMBULATORY_CARE_PROVIDER_SITE_OTHER): Payer: Self-pay | Admitting: Surgery

## 2012-08-28 ENCOUNTER — Encounter (INDEPENDENT_AMBULATORY_CARE_PROVIDER_SITE_OTHER): Payer: Self-pay | Admitting: Surgery

## 2012-08-28 ENCOUNTER — Ambulatory Visit (INDEPENDENT_AMBULATORY_CARE_PROVIDER_SITE_OTHER): Payer: Private Health Insurance - Indemnity | Admitting: Surgery

## 2012-08-28 VITALS — BP 126/62 | HR 92 | Temp 97.2°F | Resp 18 | Ht 64.0 in | Wt 146.8 lb

## 2012-08-28 DIAGNOSIS — K828 Other specified diseases of gallbladder: Secondary | ICD-10-CM | POA: Insufficient documentation

## 2012-08-28 NOTE — Progress Notes (Signed)
Patient ID: [name], female   DOB: 19-Nov-1965, 46 y.o.   MRN: [id number]  Chief Complaint  Patient presents with  . New Evaluation    [name]    HPI [name] is a 46 y.o. female.   HPI This is a pleasant female referred by Dr. Loreta Ave for evaluation of multiple abdominal complaints. She was doing well until September of this year when she started having decreased appetite and fullness. This has now progressed to nausea, feelings of impending emesis, and right upper quadrant abdominal pain. She has also had diarrhea. She has had an extensive workup including upper and lower endoscopy, CAT scan, and HIDA scan. The pain is mild to moderate pain cramping in nature. She denies jaundice. She has been losing weight. Past Medical History  Diagnosis Date  . Migraine     Past Surgical History  Procedure Date  . Brachiplasty     s/p weight loss   . Abdominal hysterectomy   . Abdominalplasty     s/p weight loss    Family History  Problem Relation Age of Onset  . Diabetes Mother   . Hypertension Mother   . Thyroid disease Mother   . Cancer Father   . Diabetes Father   . Hypertension Father   . Thyroid disease Sister   . Diabetes Brother     Social History History  Substance Use Topics  . Smoking status: Never Smoker   . Smokeless tobacco: Not on file  . Alcohol Use: No    Allergies  Allergen Reactions  . Codeine     Vomiting     Current Outpatient Prescriptions  Medication Sig Dispense Refill  . buPROPion (WELLBUTRIN) 100 MG tablet Take 300 mg by mouth once.       . diazepam (VALIUM) 5 MG tablet Take 5 mg by mouth every 4 (four) hours as needed. Anxiety      . Melatonin 5 MG CAPS Take 5 mg by mouth at bedtime.       . ondansetron (ZOFRAN-ODT) 4 MG disintegrating tablet Take 4 mg by mouth every 8 (eight) hours as needed. For nausea      . rizatriptan (MAXALT) 10 MG tablet Take 10 mg by mouth as needed. May repeat in 2 hours if needed For migraines      . topiramate  (TOPAMAX) 100 MG tablet Take 100 mg by mouth daily.         Review of Systems Review of Systems  Constitutional: Positive for appetite change, fatigue and unexpected weight change. Negative for fever and chills.  HENT: Negative for hearing loss, congestion, sore throat, trouble swallowing and voice change.   Eyes: Negative for visual disturbance.  Respiratory: Negative for cough and wheezing.   Cardiovascular: Negative for chest pain, palpitations and leg swelling.  Gastrointestinal: Positive for nausea, abdominal pain and diarrhea. Negative for vomiting, constipation, blood in stool, abdominal distention and anal bleeding.  Genitourinary: Negative for hematuria, vaginal bleeding and difficulty urinating.  Musculoskeletal: Negative for arthralgias.  Skin: Negative for rash and wound.  Neurological: Negative for seizures, syncope and headaches.  Hematological: Negative for adenopathy. Does not bruise/bleed easily.  Psychiatric/Behavioral: Negative for confusion.    Blood pressure 126/62, pulse 92, temperature 97.2 F (36.2 C), temperature source Oral, resp. rate 18, height 5\' 4"  (1.626 m), weight 146 lb 12.8 oz (66.588 kg).  Physical Exam Physical Exam  Constitutional: She is oriented to person, place, and time. She appears well-developed and well-nourished. No distress.  HENT:  Head: Normocephalic and atraumatic.  Right Ear: External ear normal.  Left Ear: External ear normal.  Nose: Nose normal.  Mouth/Throat: Oropharynx is clear and moist. No oropharyngeal exudate.  Eyes: Conjunctivae normal are normal. Pupils are equal, round, and reactive to light. Right eye exhibits no discharge. Left eye exhibits no discharge. No scleral icterus.  Neck: Normal range of motion. Neck supple. No tracheal deviation present.  Cardiovascular: Normal rate, regular rhythm, normal heart sounds and intact distal pulses.   No murmur heard. Pulmonary/Chest: Effort normal and breath sounds normal. No  respiratory distress. She has no wheezes. She has no rales.  Abdominal: Soft. Bowel sounds are normal. She exhibits no distension. There is no tenderness. There is no rebound and no guarding.  Musculoskeletal: Normal range of motion. She exhibits no edema and no tenderness.  Lymphadenopathy:    She has no cervical adenopathy.  Neurological: She is alert and oriented to person, place, and time.  Skin: Skin is warm and dry. No rash noted. She is not diaphoretic. No erythema.  Psychiatric: Her behavior is normal. Judgment normal.    Data Reviewed I have the report and pictures from her endoscopy. I have her CAT scan demonstrating an ovarian cyst and hemangiomas of the liver. I have her HIDA scan demonstrating a 30% gallbladder ejection fraction. Liver function tests are normal. Her symptoms were reproduced with CCK infusion  Assessment    Biliary dyskinesia    Plan    Given her symptoms, laparoscopic cholecystectomy is strongly recommended. I discussed this with the patient and her husband in detail. I gave him literature regarding surgery. I discussed the risks which includes but is not limited to bleeding, infection, bile duct injury, bile leak, injury to other structures, need to convert to an open procedure, the chance this may not resolve her symptoms, etc. She understands and wishes to proceed. Likelihood of success is good       [name] A 08/28/2012, 11:02 AM

## 2012-08-30 ENCOUNTER — Encounter (HOSPITAL_COMMUNITY): Payer: Self-pay | Admitting: Pharmacy Technician

## 2012-08-31 ENCOUNTER — Encounter (HOSPITAL_COMMUNITY): Payer: Self-pay

## 2012-08-31 ENCOUNTER — Encounter (HOSPITAL_COMMUNITY)
Admission: RE | Admit: 2012-08-31 | Discharge: 2012-08-31 | Disposition: A | Discharge status: Home / Self Care | Payer: 59 | Source: Ambulatory Visit | Attending: Surgery | Admitting: Surgery

## 2012-08-31 HISTORY — DX: Nausea with vomiting, unspecified: R11.2

## 2012-08-31 HISTORY — DX: Other specified postprocedural states: Z98.890

## 2012-08-31 HISTORY — DX: Abnormal weight loss: R63.4

## 2012-08-31 HISTORY — DX: Dizziness and giddiness: R42

## 2012-08-31 LAB — CBC
MCH: 33 pg (ref 26.0–34.0)
Platelets: 196 10*3/uL (ref 150–400)
RBC: 4.33 MIL/uL (ref 3.87–5.11)

## 2012-08-31 LAB — BASIC METABOLIC PANEL
BUN: 10 mg/dL (ref 6–23)
Creatinine, Ser: 0.86 mg/dL (ref 0.50–1.10)
GFR calc Af Amer: >90 mL/min (ref >90)
GFR calc non Af Amer: 80 mL/min — ABNORMAL LOW (ref >90)
Potassium: 3.9 mEq/L (ref 3.5–5.1)

## 2012-08-31 LAB — SURGICAL PCR SCREEN
MRSA, PCR: NEGATIVE
Staphylococcus aureus: NEGATIVE

## 2012-08-31 NOTE — Pre-Procedure Instructions (Signed)
20 [name]  08/31/2012   Your procedure is scheduled on:  09/04/12  Report to [facility] at 530 AM.  Call this number if you have problems the morning of surgery: [phone]   Remember:   Do not eat food:After Midnight.    Take these medicines the morning of surgery with A SIP OF WATER: valium,wellbutrin,zofran,topamax   Do not wear jewelry, make-up or nail polish.  Do not wear lotions, powders, or perfumes. You may wear deodorant.  Do not shave 48 hours prior to surgery. Men may shave face and neck.  Do not bring valuables to the hospital.  Contacts, dentures or bridgework may not be worn into surgery.  Leave suitcase in the car. After surgery it may be brought to your room.  For patients admitted to the hospital, checkout time is 11:00 AM the day of discharge.   Patients discharged the day of surgery will not be allowed to drive home.  Name and phone number of your driver: family  Special Instructions: Shower using CHG 2 nights before surgery and the night before surgery.  If you shower the day of surgery use CHG.  Use special wash - you have one bottle of CHG for all showers.  You should use approximately 1/3 of the bottle for each shower.   Please read over the following fact sheets that you were given: Pain Booklet, Coughing and Deep Breathing, MRSA Information and Surgical Site Infection Prevention

## 2012-09-01 ENCOUNTER — Encounter (HOSPITAL_COMMUNITY): Admission: RE | Admit: 2012-09-01 | Payer: PRIVATE HEALTH INSURANCE | Source: Ambulatory Visit

## 2012-09-03 MED ORDER — CEFAZOLIN SODIUM-DEXTROSE 2-3 GM-% IV SOLR
2.0000 g | INTRAVENOUS | Status: AC
  Administered 2012-09-04: 2 g via INTRAVENOUS
  Filled 2012-09-03: qty 50

## 2012-09-04 ENCOUNTER — Encounter (HOSPITAL_COMMUNITY): Payer: Self-pay

## 2012-09-04 ENCOUNTER — Ambulatory Visit (INDEPENDENT_AMBULATORY_CARE_PROVIDER_SITE_OTHER): Payer: Self-pay | Admitting: Surgery

## 2012-09-04 ENCOUNTER — Encounter (HOSPITAL_COMMUNITY)
Admission: RE | Disposition: A | Discharge status: Home / Self Care | Payer: Self-pay | Source: Ambulatory Visit | Attending: Surgery

## 2012-09-04 ENCOUNTER — Encounter (HOSPITAL_COMMUNITY): Payer: Self-pay | Admitting: Anesthesiology

## 2012-09-04 ENCOUNTER — Ambulatory Visit (HOSPITAL_COMMUNITY)
Admission: RE | Admit: 2012-09-04 | Discharge: 2012-09-04 | Disposition: A | Discharge status: Home / Self Care | Payer: 59 | Source: Ambulatory Visit | Attending: Surgery | Admitting: Surgery

## 2012-09-04 ENCOUNTER — Ambulatory Visit (HOSPITAL_COMMUNITY): Payer: 59 | Admitting: Anesthesiology

## 2012-09-04 DIAGNOSIS — K824 Cholesterolosis of gallbladder: Secondary | ICD-10-CM

## 2012-09-04 DIAGNOSIS — G43909 Migraine, unspecified, not intractable, without status migrainosus: Secondary | ICD-10-CM | POA: Insufficient documentation

## 2012-09-04 DIAGNOSIS — K828 Other specified diseases of gallbladder: Secondary | ICD-10-CM | POA: Insufficient documentation

## 2012-09-04 DIAGNOSIS — Z79899 Other long term (current) drug therapy: Secondary | ICD-10-CM | POA: Insufficient documentation

## 2012-09-04 DIAGNOSIS — K811 Chronic cholecystitis: Secondary | ICD-10-CM

## 2012-09-04 DIAGNOSIS — K819 Cholecystitis, unspecified: Secondary | ICD-10-CM | POA: Insufficient documentation

## 2012-09-04 DIAGNOSIS — Z01812 Encounter for preprocedural laboratory examination: Secondary | ICD-10-CM | POA: Insufficient documentation

## 2012-09-04 HISTORY — PX: CHOLECYSTECTOMY: SHX55

## 2012-09-04 SURGERY — LAPAROSCOPIC CHOLECYSTECTOMY
Anesthesia: General | Site: Abdomen | Wound class: Clean Contaminated

## 2012-09-04 MED ORDER — HYDROMORPHONE HCL PF 1 MG/ML IJ SOLN
INTRAMUSCULAR | Status: AC
  Administered 2012-09-04: 0.25 mg via INTRAVENOUS
  Filled 2012-09-04: qty 1

## 2012-09-04 MED ORDER — MORPHINE SULFATE 4 MG/ML IJ SOLN
4.0000 mg | INTRAMUSCULAR | Status: DC | PRN
  Administered 2012-09-04: 4 mg via INTRAVENOUS

## 2012-09-04 MED ORDER — SCOPOLAMINE 1 MG/3DAYS TD PT72
MEDICATED_PATCH | TRANSDERMAL | Status: DC | PRN
  Administered 2012-09-04: 1 via TRANSDERMAL

## 2012-09-04 MED ORDER — SODIUM CHLORIDE 0.9 % IJ SOLN: 3.0000 mL | INTRAMUSCULAR | Status: DC | PRN

## 2012-09-04 MED ORDER — ROCURONIUM BROMIDE 100 MG/10ML IV SOLN
INTRAVENOUS | Status: DC | PRN
  Administered 2012-09-04: 30 mg via INTRAVENOUS

## 2012-09-04 MED ORDER — OXYCODONE HCL 5 MG/5ML PO SOLN: 5.0000 mg | Freq: Once | ORAL | Status: DC | PRN

## 2012-09-04 MED ORDER — GLYCOPYRROLATE 0.2 MG/ML IJ SOLN
INTRAMUSCULAR | Status: DC | PRN
  Administered 2012-09-04: 0.4 mg via INTRAVENOUS

## 2012-09-04 MED ORDER — DEXAMETHASONE SODIUM PHOSPHATE 4 MG/ML IJ SOLN
INTRAMUSCULAR | Status: DC | PRN
  Administered 2012-09-04: 4 mg via INTRAVENOUS

## 2012-09-04 MED ORDER — ACETAMINOPHEN 650 MG RE SUPP: 650.0000 mg | RECTAL | Status: DC | PRN

## 2012-09-04 MED ORDER — DIPHENHYDRAMINE HCL 50 MG/ML IJ SOLN
INTRAMUSCULAR | Status: DC | PRN
  Administered 2012-09-04: 25 mg via INTRAVENOUS

## 2012-09-04 MED ORDER — ONDANSETRON HCL 4 MG/2ML IJ SOLN: 4.0000 mg | Freq: Four times a day (QID) | INTRAMUSCULAR | Status: DC | PRN

## 2012-09-04 MED ORDER — 0.9 % SODIUM CHLORIDE (POUR BTL) OPTIME
TOPICAL | Status: DC | PRN
  Administered 2012-09-04: 1000 mL

## 2012-09-04 MED ORDER — LACTATED RINGERS IV SOLN
INTRAVENOUS | Status: DC | PRN
  Administered 2012-09-04: 07:00:00 via INTRAVENOUS

## 2012-09-04 MED ORDER — TRAMADOL HCL 50 MG PO TABS: 50.0000 mg | ORAL_TABLET | Freq: Four times a day (QID) | ORAL | Status: DC | PRN

## 2012-09-04 MED ORDER — KETOROLAC TROMETHAMINE 30 MG/ML IJ SOLN
INTRAMUSCULAR | Status: DC | PRN
  Administered 2012-09-04: 30 mg via INTRAVENOUS

## 2012-09-04 MED ORDER — SODIUM CHLORIDE 0.9 % IJ SOLN: 3.0000 mL | Freq: Two times a day (BID) | INTRAMUSCULAR | Status: DC

## 2012-09-04 MED ORDER — NEOSTIGMINE METHYLSULFATE 1 MG/ML IJ SOLN
INTRAMUSCULAR | Status: DC | PRN
  Administered 2012-09-04: 3 mg via INTRAVENOUS

## 2012-09-04 MED ORDER — BUPIVACAINE-EPINEPHRINE 0.25% -1:200000 IJ SOLN
INTRAMUSCULAR | Status: DC | PRN
  Administered 2012-09-04: 20 mL

## 2012-09-04 MED ORDER — SCOPOLAMINE 1 MG/3DAYS TD PT72
MEDICATED_PATCH | TRANSDERMAL | Status: AC
  Filled 2012-09-04: qty 1

## 2012-09-04 MED ORDER — ONDANSETRON HCL 4 MG/2ML IJ SOLN
INTRAMUSCULAR | Status: DC | PRN
  Administered 2012-09-04: 4 mg via INTRAVENOUS

## 2012-09-04 MED ORDER — PROPOFOL 10 MG/ML IV BOLUS
INTRAVENOUS | Status: DC | PRN
  Administered 2012-09-04: 200 mg via INTRAVENOUS

## 2012-09-04 MED ORDER — MORPHINE SULFATE 4 MG/ML IJ SOLN
INTRAMUSCULAR | Status: AC
  Filled 2012-09-04: qty 1

## 2012-09-04 MED ORDER — LIDOCAINE HCL (CARDIAC) 20 MG/ML IV SOLN
INTRAVENOUS | Status: DC | PRN
  Administered 2012-09-04: 100 mg via INTRAVENOUS

## 2012-09-04 MED ORDER — OXYCODONE HCL 5 MG PO TABS: 5.0000 mg | ORAL_TABLET | ORAL | Status: DC | PRN

## 2012-09-04 MED ORDER — HYDROMORPHONE HCL PF 1 MG/ML IJ SOLN
0.2500 mg | INTRAMUSCULAR | Status: DC | PRN
  Administered 2012-09-04 (×4): 0.25 mg via INTRAVENOUS

## 2012-09-04 MED ORDER — OXYCODONE HCL 5 MG PO TABS: 5.0000 mg | ORAL_TABLET | Freq: Once | ORAL | Status: DC | PRN

## 2012-09-04 MED ORDER — MIDAZOLAM HCL 5 MG/5ML IJ SOLN
INTRAMUSCULAR | Status: DC | PRN
  Administered 2012-09-04: 2 mg via INTRAVENOUS

## 2012-09-04 MED ORDER — SODIUM CHLORIDE 0.9 % IR SOLN
Status: DC | PRN
  Administered 2012-09-04: 1000 mL

## 2012-09-04 MED ORDER — ACETAMINOPHEN 325 MG PO TABS: 650.0000 mg | ORAL_TABLET | ORAL | Status: DC | PRN

## 2012-09-04 MED ORDER — SODIUM CHLORIDE 0.9 % IV SOLN: 250.0000 mL | INTRAVENOUS | Status: DC | PRN

## 2012-09-04 MED ORDER — FENTANYL CITRATE 0.05 MG/ML IJ SOLN
INTRAMUSCULAR | Status: DC | PRN
  Administered 2012-09-04: 50 ug via INTRAVENOUS

## 2012-09-04 SURGICAL SUPPLY — 42 items
APPLIER CLIP 5 13 M/L LIGAMAX5 (MISCELLANEOUS) ×2
BANDAGE ADHESIVE 1X3 (GAUZE/BANDAGES/DRESSINGS) ×8 IMPLANT
BENZOIN TINCTURE PRP APPL 2/3 (GAUZE/BANDAGES/DRESSINGS) ×2 IMPLANT
CANISTER SUCTION 2500CC (MISCELLANEOUS) ×2 IMPLANT
CHLORAPREP W/TINT 26ML (MISCELLANEOUS) ×2 IMPLANT
CLIP APPLIE 5 13 M/L LIGAMAX5 (MISCELLANEOUS) ×1 IMPLANT
CLOTH BEACON ORANGE TIMEOUT ST (SAFETY) ×2 IMPLANT
COVER MAYO STAND STRL (DRAPES) IMPLANT
COVER SURGICAL LIGHT HANDLE (MISCELLANEOUS) ×2 IMPLANT
DECANTER SPIKE VIAL GLASS SM (MISCELLANEOUS) ×2 IMPLANT
DRAPE C-ARM 42X72 X-RAY (DRAPES) IMPLANT
ELECT REM PT RETURN 9FT ADLT (ELECTROSURGICAL) ×2
ELECTRODE REM PT RTRN 9FT ADLT (ELECTROSURGICAL) ×1 IMPLANT
GLOVE BIOGEL PI IND STRL 6.5 (GLOVE) ×1 IMPLANT
GLOVE BIOGEL PI IND STRL 7.5 (GLOVE) ×2 IMPLANT
GLOVE BIOGEL PI INDICATOR 6.5 (GLOVE) ×1
GLOVE BIOGEL PI INDICATOR 7.5 (GLOVE) ×2
GLOVE SS BIOGEL STRL SZ 7 (GLOVE) ×1 IMPLANT
GLOVE SUPERSENSE BIOGEL SZ 7 (GLOVE) ×1
GLOVE SURG SIGNA 7.5 PF LTX (GLOVE) ×2 IMPLANT
GLOVE SURG SS PI 6.5 STRL IVOR (GLOVE) ×2 IMPLANT
GOWN PREVENTION PLUS XLARGE (GOWN DISPOSABLE) ×2 IMPLANT
GOWN STRL NON-REIN LRG LVL3 (GOWN DISPOSABLE) ×4 IMPLANT
KIT BASIN OR (CUSTOM PROCEDURE TRAY) ×2 IMPLANT
KIT ROOM TURNOVER OR (KITS) ×2 IMPLANT
NS IRRIG 1000ML POUR BTL (IV SOLUTION) ×2 IMPLANT
PAD ARMBOARD 7.5X6 YLW CONV (MISCELLANEOUS) ×4 IMPLANT
POUCH SPECIMEN RETRIEVAL 10MM (ENDOMECHANICALS) IMPLANT
SCISSORS LAP 5X35 DISP (ENDOMECHANICALS) IMPLANT
SET CHOLANGIOGRAPH 5 50 .035 (SET/KITS/TRAYS/PACK) IMPLANT
SET IRRIG TUBING LAPAROSCOPIC (IRRIGATION / IRRIGATOR) ×2 IMPLANT
SLEEVE ENDOPATH XCEL 5M (ENDOMECHANICALS) ×4 IMPLANT
SPECIMEN JAR SMALL (MISCELLANEOUS) ×2 IMPLANT
STRIP CLOSURE SKIN 1/2X4 (GAUZE/BANDAGES/DRESSINGS) ×2 IMPLANT
SUT MON AB 4-0 PC3 18 (SUTURE) ×2 IMPLANT
SUT VICRYL 0 UR6 27IN ABS (SUTURE) ×2 IMPLANT
TOWEL OR 17X24 6PK STRL BLUE (TOWEL DISPOSABLE) ×2 IMPLANT
TOWEL OR 17X26 10 PK STRL BLUE (TOWEL DISPOSABLE) ×2 IMPLANT
TRAY LAPAROSCOPIC (CUSTOM PROCEDURE TRAY) ×2 IMPLANT
TROCAR XCEL BLUNT TIP 100MML (ENDOMECHANICALS) ×2 IMPLANT
TROCAR XCEL NON-BLD 5MMX100MML (ENDOMECHANICALS) ×2 IMPLANT
WATER STERILE IRR 1000ML POUR (IV SOLUTION) IMPLANT

## 2012-09-04 NOTE — Progress Notes (Signed)
STATES WAS ABLE TO VOID

## 2012-09-04 NOTE — Op Note (Signed)
Laparoscopic Cholecystectomy Procedure Note  Indications: This patient presents with symptomatic gallbladder disease and will undergo laparoscopic cholecystectomy.  Pre-operative Diagnosis: biliary dyskinesia  Post-operative Diagnosis: Same  Surgeon: Abigail Miyamoto A   Assistants: 0  Anesthesia: General endotracheal anesthesia  ASA Class: 2  Procedure Details  The patient was seen again in the Holding Room. The risks, benefits, complications, treatment options, and expected outcomes were discussed with the patient. The possibilities of reaction to medication, pulmonary aspiration, perforation of viscus, bleeding, recurrent infection, finding a normal gallbladder, the need for additional procedures, failure to diagnose a condition, the possible need to convert to an open procedure, and creating a complication requiring transfusion or operation were discussed with the patient. The likelihood of improving the patient's symptoms with return to their baseline status is good.  The patient and/or family concurred with the proposed plan, giving informed consent. The site of surgery properly noted. The patient was taken to Operating Room, identified as [name] and the procedure verified as Laparoscopic Cholecystectomy with Intraoperative Cholangiogram. A Time Out was held and the above information confirmed.  Prior to the induction of general anesthesia, antibiotic prophylaxis was administered. General endotracheal anesthesia was then administered and tolerated well. After the induction, the abdomen was prepped with Chloraprep and draped in sterile fashion. The patient was positioned in the supine position.  Local anesthetic agent was injected into the skin near the umbilicus and an incision made. We dissected down to the abdominal fascia with blunt dissection.  The fascia was incised vertically and we entered the peritoneal cavity bluntly.  A pursestring suture of 0-Vicryl was placed around the  fascial opening.  The Hasson cannula was inserted and secured with the stay suture.  Pneumoperitoneum was then created with CO2 and tolerated well without any adverse changes in the patient's vital signs. An 11-mm port was placed in the subxiphoid position.  Two 5-mm ports were placed in the right upper quadrant. All skin incisions were infiltrated with a local anesthetic agent before making the incision and placing the trocars.   We positioned the patient in reverse Trendelenburg, tilted slightly to the patient's left.  The gallbladder was identified, the fundus grasped and retracted cephalad. Adhesions were lysed bluntly and with the electrocautery where indicated, taking care not to injure any adjacent organs or viscus. The infundibulum was grasped and retracted laterally, exposing the peritoneum overlying the triangle of Calot. This was then divided and exposed in a blunt fashion. The cystic duct was clearly identified and bluntly dissected circumferentially. A critical view of the cystic duct and cystic artery was obtained.  The cystic duct was then ligated with clips and divided. The cystic artery was, dissected free, ligated with clips and divided as well.   The gallbladder was dissected from the liver bed in retrograde fashion with the electrocautery. The gallbladder was removed and placed in an Endocatch sac. The liver bed was irrigated and inspected. Hemostasis was achieved with the electrocautery. Copious irrigation was utilized and was repeatedly aspirated until clear.  The gallbladder and Endocatch sac were then removed through the umbilical port site.  The pursestring suture was used to close the umbilical fascia.    We again inspected the right upper quadrant for hemostasis.  Pneumoperitoneum was released as we removed the trocars.  4-0 Monocryl was used to close the skin.   Benzoin, steri-strips, and clean dressings were applied. The patient was then extubated and brought to the recovery room  in stable condition. Instrument, sponge, and needle  counts were correct at closure and at the conclusion of the case.   Findings: Cholecystitis without Cholelithiasis  Estimated Blood Loss: Minimal         Drains: 0         Specimens: Gallbladder           Complications: None; patient tolerated the procedure well.         Disposition: PACU - hemodynamically stable.         Condition: stable

## 2012-09-04 NOTE — Interval H&P Note (Signed)
History and Physical Interval Note:  09/04/2012 6:51 AM  [name]  has presented today for surgery, with the diagnosis of bilary dyskinesia  The various methods of treatment have been discussed with the patient and family. After consideration of risks, benefits and other options for treatment, the patient has consented to  Procedure(s) (LRB) with comments: LAPAROSCOPIC CHOLECYSTECTOMY (N/A) as a surgical intervention .  The patient's history has been reviewed, patient examined, no change in status, stable for surgery.  I have reviewed the patient's chart and labs.  Questions were answered to the patient's satisfaction.     Marenda Accardi A

## 2012-09-04 NOTE — Anesthesia Preprocedure Evaluation (Signed)
Anesthesia Evaluation  Patient identified by MRN, date of birth, ID band Patient awake    Reviewed: Allergy & Precautions, H&P , NPO status , Patient's Chart, lab work & pertinent test results  History of Anesthesia Complications (+) PONV  Airway Mallampati: II  Neck ROM: full    Dental   Pulmonary          Cardiovascular     Neuro/Psych  Headaches,    GI/Hepatic   Endo/Other    Renal/GU      Musculoskeletal   Abdominal   Peds  Hematology   Anesthesia Other Findings   Reproductive/Obstetrics                           Anesthesia Physical Anesthesia Plan  ASA: II  Anesthesia Plan: General   Post-op Pain Management:    Induction: Intravenous  Airway Management Planned: Oral ETT  Additional Equipment:   Intra-op Plan:   Post-operative Plan: Extubation in OR  Informed Consent: I have reviewed the patients History and Physical, chart, labs and discussed the procedure including the risks, benefits and alternatives for the proposed anesthesia with the patient or authorized representative who has indicated his/her understanding and acceptance.     Plan Discussed with: CRNA and Surgeon  Anesthesia Plan Comments:         Anesthesia Quick Evaluation

## 2012-09-04 NOTE — Anesthesia Postprocedure Evaluation (Signed)
Anesthesia Post Note  Patient: [name]  Procedure(s) Performed: Procedure(s) (LRB): LAPAROSCOPIC CHOLECYSTECTOMY (N/A)  Anesthesia type: General  Patient location: PACU  Post pain: Pain level controlled and Adequate analgesia  Post assessment: Post-op Vital signs reviewed, Patient's Cardiovascular Status Stable, Respiratory Function Stable, Patent Airway and Pain level controlled  Last Vitals:  Filed Vitals:   09/04/12 1027  BP: 111/62  Pulse: 70  Temp: 36.1 C  Resp: 16    Post vital signs: Reviewed and stable  Level of consciousness: awake, alert  and oriented  Complications: No apparent anesthesia complications

## 2012-09-04 NOTE — H&P (Signed)
Patient ID: [name], female DOB: 29-Jun-1966, 46 y.o. MRN: [id number]  Chief Complaint   Patient presents with   .  New Evaluation     [name]    HPI  [name] is a 46 y.o. female.  HPI  This is a pleasant female referred by Dr. Loreta Ave for evaluation of multiple abdominal complaints. She was doing well until September of this year when she started having decreased appetite and fullness. This has now progressed to nausea, feelings of impending emesis, and right upper quadrant abdominal pain. She has also had diarrhea. She has had an extensive workup including upper and lower endoscopy, CAT scan, and HIDA scan. The pain is mild to moderate pain cramping in nature. She denies jaundice. She has been losing weight.  Past Medical History   Diagnosis  Date   .  Migraine     Past Surgical History   Procedure  Date   .  Brachiplasty      s/p weight loss   .  Abdominal hysterectomy    .  Abdominalplasty      s/p weight loss    Family History   Problem  Relation  Age of Onset   .  Diabetes  Mother    .  Hypertension  Mother    .  Thyroid disease  Mother    .  Cancer  Father    .  Diabetes  Father    .  Hypertension  Father    .  Thyroid disease  Sister    .  Diabetes  Brother     Social History  History   Substance Use Topics   .  Smoking status:  Never Smoker   .  Smokeless tobacco:  Not on file   .  Alcohol Use:  No    Allergies   Allergen  Reactions   .  Codeine      Vomiting    Current Outpatient Prescriptions   Medication  Sig  Dispense  Refill   .  buPROPion (WELLBUTRIN) 100 MG tablet  Take 300 mg by mouth once.     .  diazepam (VALIUM) 5 MG tablet  Take 5 mg by mouth every 4 (four) hours as needed. Anxiety     .  Melatonin 5 MG CAPS  Take 5 mg by mouth at bedtime.     .  ondansetron (ZOFRAN-ODT) 4 MG disintegrating tablet  Take 4 mg by mouth every 8 (eight) hours as needed. For nausea     .  rizatriptan (MAXALT) 10 MG tablet  Take 10 mg by mouth as needed. May  repeat in 2 hours if needed  For migraines     .  topiramate (TOPAMAX) 100 MG tablet  Take 100 mg by mouth daily.      Review of Systems  Review of Systems  Constitutional: Positive for appetite change, fatigue and unexpected weight change. Negative for fever and chills.  HENT: Negative for hearing loss, congestion, sore throat, trouble swallowing and voice change.  Eyes: Negative for visual disturbance.  Respiratory: Negative for cough and wheezing.  Cardiovascular: Negative for chest pain, palpitations and leg swelling.  Gastrointestinal: Positive for nausea, abdominal pain and diarrhea. Negative for vomiting, constipation, blood in stool, abdominal distention and anal bleeding.  Genitourinary: Negative for hematuria, vaginal bleeding and difficulty urinating.  Musculoskeletal: Negative for arthralgias.  Skin: Negative for rash and wound.  Neurological: Negative for seizures, syncope and headaches.  Hematological: Negative for adenopathy.  Does not bruise/bleed easily.  Psychiatric/Behavioral: Negative for confusion.   Blood pressure 126/62, pulse 92, temperature 97.2 F (36.2 C), temperature source Oral, resp. rate 18, height 5\' 4"  (1.626 m), weight 146 lb 12.8 oz (66.588 kg).  Physical Exam  Physical Exam  Constitutional: She is oriented to person, place, and time. She appears well-developed and well-nourished. No distress.  HENT:  Head: Normocephalic and atraumatic.  Right Ear: External ear normal.  Left Ear: External ear normal.  Nose: Nose normal.  Mouth/Throat: Oropharynx is clear and moist. No oropharyngeal exudate.  Eyes: Conjunctivae normal are normal. Pupils are equal, round, and reactive to light. Right eye exhibits no discharge. Left eye exhibits no discharge. No scleral icterus.  Neck: Normal range of motion. Neck supple. No tracheal deviation present.  Cardiovascular: Normal rate, regular rhythm, normal heart sounds and intact distal pulses.  No murmur heard.    Pulmonary/Chest: Effort normal and breath sounds normal. No respiratory distress. She has no wheezes. She has no rales.  Abdominal: Soft. Bowel sounds are normal. She exhibits no distension. There is no tenderness. There is no rebound and no guarding.  Musculoskeletal: Normal range of motion. She exhibits no edema and no tenderness.  Lymphadenopathy:  She has no cervical adenopathy.  Neurological: She is alert and oriented to person, place, and time.  Skin: Skin is warm and dry. No rash noted. She is not diaphoretic. No erythema.  Psychiatric: Her behavior is normal. Judgment normal.   Data Reviewed  I have the report and pictures from her endoscopy. I have her CAT scan demonstrating an ovarian cyst and hemangiomas of the liver. I have her HIDA scan demonstrating a 30% gallbladder ejection fraction. Liver function tests are normal.  Her symptoms were reproduced with CCK infusion  Assessment   Biliary dyskinesia   Plan   Given her symptoms, laparoscopic cholecystectomy is strongly recommended. I discussed this with the patient and her husband in detail. I gave him literature regarding surgery. I discussed the risks which includes but is not limited to bleeding, infection, bile duct injury, bile leak, injury to other structures, need to convert to an open procedure, the chance this may not resolve her symptoms, etc. She understands and wishes to proceed. Likelihood of success is good   [name] A

## 2012-09-04 NOTE — Progress Notes (Signed)
CLIENT STATES PAIN 2/10 AND NO C/O NAUSEA; WAS ABLE TO VOID WITHOUT DIFFICULTY

## 2012-09-04 NOTE — Transfer of Care (Signed)
Immediate Anesthesia Transfer of Care Note  Patient: [name]  Procedure(s) Performed: Procedure(s) (LRB) with comments: LAPAROSCOPIC CHOLECYSTECTOMY (N/A)  Patient Location: PACU  Anesthesia Type:General  Level of Consciousness: sedated  Airway & Oxygen Therapy: Patient Spontanous Breathing and Patient connected to face mask oxygen  Post-op Assessment: Report given to PACU RN, Post -op Vital signs reviewed and stable and Patient moving all extremities  Post vital signs: Reviewed and stable  Complications: No apparent anesthesia complications

## 2012-09-05 ENCOUNTER — Encounter (HOSPITAL_COMMUNITY): Payer: Self-pay | Admitting: Surgery

## 2012-09-19 ENCOUNTER — Encounter (INDEPENDENT_AMBULATORY_CARE_PROVIDER_SITE_OTHER): Payer: Self-pay | Admitting: Surgery

## 2012-09-19 ENCOUNTER — Ambulatory Visit (INDEPENDENT_AMBULATORY_CARE_PROVIDER_SITE_OTHER): Payer: Private Health Insurance - Indemnity | Admitting: Surgery

## 2012-09-19 VITALS — BP 124/70 | HR 113 | Temp 97.6°F | Resp 18 | Ht 64.0 in | Wt 144.0 lb

## 2012-09-19 DIAGNOSIS — Z09 Encounter for follow-up examination after completed treatment for conditions other than malignant neoplasm: Secondary | ICD-10-CM

## 2012-09-19 NOTE — Progress Notes (Signed)
Subjective:     Patient ID: [name], female   DOB: October 09, 1966, 46 y.o.   MRN: [id number]  HPI She reports no improvement in her symptoms status post laparoscopic cholecystectomy. She is having loose bowel movements after fatty meals.  Review of Systems     Objective:   Physical Exam On exam, her incisions are well-healed without evidence of infection or hernia. The final pathology showed mild chronic cholecystitis with a benign polyp    Assessment:      stable status post laparoscopic cholecystectomy    Plan:     From a general surgical standpoint, there is nothing further to offer. If her loose bowel movements continue she may need a bile binding medication. She coming back if this persists. If not I encouraged her to see her gastroenterologist.

## 2012-10-15 ENCOUNTER — Ambulatory Visit (INDEPENDENT_AMBULATORY_CARE_PROVIDER_SITE_OTHER): Payer: Managed Care, Other (non HMO) | Admitting: Family Medicine

## 2012-10-15 ENCOUNTER — Encounter: Payer: Self-pay | Admitting: Family Medicine

## 2012-10-15 VITALS — BP 116/78 | HR 80 | Temp 97.6°F | Resp 16 | Ht 64.6 in | Wt 143.8 lb

## 2012-10-15 DIAGNOSIS — J0110 Acute frontal sinusitis, unspecified: Secondary | ICD-10-CM

## 2012-10-15 DIAGNOSIS — R634 Abnormal weight loss: Secondary | ICD-10-CM

## 2012-10-15 DIAGNOSIS — L0293 Carbuncle, unspecified: Secondary | ICD-10-CM

## 2012-10-15 DIAGNOSIS — L0292 Furuncle, unspecified: Secondary | ICD-10-CM

## 2012-10-15 DIAGNOSIS — R110 Nausea: Secondary | ICD-10-CM

## 2012-10-15 MED ORDER — SULFAMETHOXAZOLE-TRIMETHOPRIM 800-160 MG PO TABS: ORAL_TABLET | ORAL | Status: DC

## 2012-10-15 MED ORDER — FLUCONAZOLE 150 MG PO TABS: 150.0000 mg | ORAL_TABLET | Freq: Once | ORAL | Status: DC

## 2012-10-15 NOTE — Progress Notes (Signed)
Reviewed and agree.

## 2012-10-15 NOTE — Patient Instructions (Addendum)
1. Sinusitis, acute frontal   2. Carbuncle   3. Nausea   4. Weight loss, unintentional      RECOMMEND FOLLOW-UP WITH DR. MANN IN 2-4 WEEKS IF NOT IMPROVED.
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
## 2012-10-23 ENCOUNTER — Other Ambulatory Visit: Payer: Self-pay | Admitting: Gastroenterology

## 2012-10-23 DIAGNOSIS — R1011 Right upper quadrant pain: Secondary | ICD-10-CM

## 2012-10-23 DIAGNOSIS — R110 Nausea: Secondary | ICD-10-CM

## 2012-11-01 ENCOUNTER — Encounter (HOSPITAL_COMMUNITY)
Admission: RE | Admit: 2012-11-01 | Discharge: 2012-11-01 | Disposition: A | Discharge status: Home / Self Care | Payer: Managed Care, Other (non HMO) | Source: Ambulatory Visit | Attending: Gastroenterology | Admitting: Gastroenterology

## 2012-11-01 DIAGNOSIS — K219 Gastro-esophageal reflux disease without esophagitis: Secondary | ICD-10-CM | POA: Insufficient documentation

## 2012-11-01 DIAGNOSIS — R1011 Right upper quadrant pain: Secondary | ICD-10-CM

## 2012-11-01 DIAGNOSIS — R110 Nausea: Secondary | ICD-10-CM

## 2012-11-01 MED ORDER — TECHNETIUM TC 99M SULFUR COLLOID
2.0000 | Freq: Once | INTRAVENOUS | Status: AC | PRN
  Administered 2012-11-01: 2 via INTRAVENOUS

## 2012-11-25 ENCOUNTER — Other Ambulatory Visit: Payer: Self-pay

## 2012-12-29 ENCOUNTER — Encounter: Payer: BC Managed Care – PPO | Attending: Obstetrics and Gynecology | Admitting: Dietician

## 2012-12-29 ENCOUNTER — Encounter: Payer: Self-pay | Admitting: Dietician

## 2012-12-29 DIAGNOSIS — K3184 Gastroparesis: Secondary | ICD-10-CM | POA: Insufficient documentation

## 2012-12-29 DIAGNOSIS — Z713 Dietary counseling and surveillance: Secondary | ICD-10-CM | POA: Insufficient documentation

## 2012-12-29 NOTE — Progress Notes (Signed)
A: MHx- Gastroparesis, possibly secondary to cholecystectomy FMHx- brother, parents type II DM  Meds- marinol primary treatment for nausea.  Pt reports inability to eat essentially any fats. Most complex foods tolerated are beans and tomatoes. Can tolerate coconut and almond milk. She is getting very tired of chicken and salmon and wants more info on easily tolerated protein sources.   Pt has felt constrained by diet restrictions and is seeking clarification on which foods to attempt to eat, and how to combat severe AM nausea.  Dx:  Unintentional weight loss related to nausea from gastroparesis, as evidenced by wt loss to 144 lbs from 175 lbs, pt reports of very limited intake.   I: RD counseled patient on tenets of low residue diet. RD advised attempting to increase PA, especially walking, immediately after meals to improve some symptoms. RD clarified many misconceptions about likely tolerable foods, restriction of CHO, etc.  RD advised pt on steps to slowly increase complexity of diet, as tolerated. RD advised pt to eat foods she tolerates until she can approximate a normal energy intake (prevent more drastic wt loss).  In an effort to do so, RD recommended regular intake of juices, sports drinks, coconut milk. Per pt request, RD recommended Clif Bar or Special K protein bar for simple protein rich food. RD did warn of the high osmolarity of those foods, which may make them intolerable. RD recommended Resource Breeze liquid protein supplement.   M/E: Monitor pt tolerance to increasingly complex foods, results of future diagnostic testing, intake, wt. F/U in 5 weeks.

## 2013-01-07 DIAGNOSIS — R110 Nausea: Secondary | ICD-10-CM | POA: Insufficient documentation

## 2013-01-07 DIAGNOSIS — K5900 Constipation, unspecified: Secondary | ICD-10-CM | POA: Insufficient documentation

## 2013-02-02 ENCOUNTER — Ambulatory Visit: Payer: BC Managed Care – PPO | Admitting: Dietician

## 2013-02-16 ENCOUNTER — Ambulatory Visit: Payer: BC Managed Care – PPO | Admitting: Dietician

## 2013-02-20 ENCOUNTER — Ambulatory Visit (INDEPENDENT_AMBULATORY_CARE_PROVIDER_SITE_OTHER): Payer: BC Managed Care – PPO | Admitting: Family Medicine

## 2013-02-20 VITALS — BP 110/60 | HR 85 | Temp 98.4°F | Resp 12 | Ht 64.0 in | Wt 139.0 lb

## 2013-02-20 DIAGNOSIS — R4702 Dysphasia: Secondary | ICD-10-CM

## 2013-02-20 DIAGNOSIS — R4789 Other speech disturbances: Secondary | ICD-10-CM

## 2013-02-20 DIAGNOSIS — J029 Acute pharyngitis, unspecified: Secondary | ICD-10-CM

## 2013-02-20 LAB — POCT CBC
Granulocyte percent: 58.7 %G (ref 37–80)
HCT, POC: 43.6 % (ref 37.7–47.9)
Hemoglobin: 13.7 g/dL (ref 12.2–16.2)
Lymph, poc: 1.8 (ref 0.6–3.4)
MCH, POC: 33.5 pg — AB (ref 27–31.2)
MCHC: 31.4 g/dL — AB (ref 31.8–35.4)
MCV: 106.5 fL — AB (ref 80–97)
MID (cbc): 0.3 (ref 0–0.9)
MPV: 8.5 fL (ref 0–99.8)
POC Granulocyte: 3 (ref 2–6.9)
POC LYMPH PERCENT: 35.8 % (ref 10–50)
POC MID %: 5.5 %M (ref 0–12)
Platelet Count, POC: 212 10*3/uL (ref 142–424)
RBC: 4.09 M/uL (ref 4.04–5.48)
RDW, POC: 11.9 %
WBC: 5.1 10*3/uL (ref 4.6–10.2)

## 2013-02-20 LAB — POCT RAPID STREP A (OFFICE): Rapid Strep A Screen: NEGATIVE

## 2013-02-20 MED ORDER — FLUCONAZOLE 150 MG PO TABS: 150.0000 mg | ORAL_TABLET | Freq: Once | ORAL | Status: DC

## 2013-02-20 NOTE — Progress Notes (Signed)
[facility]:  Office Visit  Chief Complaint:  Chief Complaint  Patient presents with  . Thrush    HPI: [name] is a 47 y.o. female who complains of  2-3 day history of difficulty swallowing, scratchy throat. She has had chills. Has a h/o recently dx  gastroparesis and is eating a lot of acidic food. She likes vinegar and pickles. Is beeing seen at [facility] for further eval agfter being seen by Dr. Charna Elizabeth. Has not tried any medicines. Has had EGD and that was nl. Denies Reflux sxs. Denies odynophagia. Denies gastritis or ulcer or bad breath. She has had some bumps on her tongue from the acid and also a white covering on her tongue which she has tried to brush off.   Past Medical History  Diagnosis Date  . PONV (postoperative nausea and vomiting)   . Dizziness   . Weight decrease   . Migraine     [facility]  . Allergy    Past Surgical History  Procedure Laterality Date  . Brachiplasty      s/p weight loss   . Abdominal hysterectomy    . Abdominalplasty      s/p weight loss  . Tonsillectomy    . Knee arthroscopy w/ acl reconstruction      in early 1990's  . Cholecystectomy  09/04/2012    Procedure: LAPAROSCOPIC CHOLECYSTECTOMY;  Surgeon: Shelly Rubenstein, MD;  Location: [facility];  Service: General;  Laterality: N/A;  . Cosmetic surgery     History   Social History  . Marital Status: Married    Spouse Name: N/A    Number of Children: N/A  . Years of Education: N/A   Social History Main Topics  . Smoking status: Never Smoker   . Smokeless tobacco: None  . Alcohol Use: No  . Drug Use: No  . Sexually Active: None   Other Topics Concern  . None   Social History Narrative  . None   Family History  Problem Relation Age of Onset  . Diabetes Mother   . Hypertension Mother   . Thyroid disease Mother   . Cancer Father   . Diabetes Father   . Hypertension Father   . Thyroid disease Sister   . Diabetes Brother    Allergies   Allergen Reactions  . Codeine Nausea And Vomiting    Severe Vomiting for days.  All forms of codeine, including synthetic.   Marland Kitchen Erythromycin Rash   Prior to Admission medications   Medication Sig Start Date End Date Taking? Authorizing Provider  buPROPion (WELLBUTRIN XL) 150 MG 24 hr tablet Take 450 mg by mouth every morning.   Yes Historical Provider, MD  diazepam (VALIUM) 10 MG tablet Take 10 mg by mouth daily as needed. For anxiety.   Yes Historical Provider, MD  dronabinol (MARINOL) 2.5 MG capsule Take 2.5 mg by mouth as needed.   Yes Historical Provider, MD  Melatonin 5 MG CAPS Take 5 mg by mouth at bedtime.    Yes Historical Provider, MD  rizatriptan (MAXALT) 10 MG tablet Take 10 mg by mouth as needed. May repeat in 2 hours if needed For migraines   Yes Historical Provider, MD  topiramate (TOPAMAX) 200 MG tablet Take 300 mg by mouth at bedtime.   Yes Historical Provider, MD  cyclobenzaprine (FLEXERIL) 10 MG tablet  08/30/12   Historical Provider, MD  fluconazole (DIFLUCAN) 150 MG tablet Take 1 tablet (150 mg total)  by mouth once. Repeat if needed; PLEASE PLACE ON HOLD FOR PATIENT. 10/15/12   Ethelda Chick, MD  ondansetron [facility]) 8 MG tablet  09/14/12   Historical Provider, MD  ondansetron (ZOFRAN-ODT) 4 MG disintegrating tablet Take 4 mg by mouth every 8 (eight) hours as needed. For nausea 07/27/12   Avantika Shere P Maley Venezia, DO  sulfamethoxazole-trimethoprim (BACTRIM DS,SEPTRA DS) 800-160 MG per tablet 2 tablets twice daily x 10 days 10/15/12   Ethelda Chick, MD  traMADol (ULTRAM) 50 MG tablet Take 1-2 tablets (50-100 mg total) by mouth every 6 (six) hours as needed for pain. 09/04/12   Shelly Rubenstein, MD     ROS: The patient denies fevers, night sweats, unintentional weight loss, chest pain, palpitations, wheezing, dyspnea on exertion, nausea, vomiting, abdominal pain, dysuria, hematuria, melena, numbness, weakness, or tingling.  All other systems have been reviewed and were otherwise negative  with the exception of those mentioned in the HPI and as above.    PHYSICAL EXAM: Filed Vitals:   02/20/13 0919  BP: 110/60  Pulse: 85  Temp: 98.4 F (36.9 C)  Resp: 12   Filed Vitals:   02/20/13 0919  Height: 5\' 4"  (1.626 m)  Weight: 139 lb (63.05 kg)   Body mass index is 23.85 kg/(m^2).  General: Alert, no acute distress HEENT:  Normocephalic, atraumatic, oropharynx patent. + geographic tongue. No e/o thrush. TM normal. Mild erythema of posterior pharynx. Prominent denuded papillar mucosa of tongue Cardiovascular:  Regular rate and rhythm, no rubs murmurs or gallops.  No Carotid bruits, radial pulse intact. No pedal edema.  Respiratory: Clear to auscultation bilaterally.  No wheezes, rales, or rhonchi.  No cyanosis, no use of accessory musculature GI: No organomegaly, abdomen is soft and non-tender, positive bowel sounds.  No masses. Skin: No rashes. Neurologic: Facial musculature symmetric. Psychiatric: Patient is appropriate throughout our interaction. Lymphatic: No cervical lymphadenopathy Musculoskeletal: Gait intact.   LABS: Results for orders placed in visit on 02/20/13  POCT CBC      Result Value Range   WBC 5.1  4.6 - 10.2 K/uL   Lymph, poc 1.8  0.6 - 3.4   POC LYMPH PERCENT 35.8  10 - 50 %L   MID (cbc) 0.3  0 - 0.9   POC MID % 5.5  0 - 12 %M   POC Granulocyte 3.0  2 - 6.9   Granulocyte percent 58.7  37 - 80 %G   RBC 4.09  4.04 - 5.48 M/uL   Hemoglobin 13.7  12.2 - 16.2 g/dL   HCT, POC 40.9  81.1 - 47.9 %   MCV 106.5 (*) 80 - 97 fL   MCH, POC 33.5 (*) 27 - 31.2 pg   MCHC 31.4 (*) 31.8 - 35.4 g/dL   RDW, POC 91.4     Platelet Count, POC 212  142 - 424 K/uL   MPV 8.5  0 - 99.8 fL  POCT RAPID STREP A (OFFICE)      Result Value Range   Rapid Strep A Screen Negative  Negative     EKG/XRAY:   Primary read interpreted by Dr. Conley Rolls at [facility].   ASSESSMENT/PLAN: Encounter Diagnoses  Name Primary?  . Acute pharyngitis Yes  . Dysphasia    I don't suspect  this is thrush. However her geographical tongue and denuding of her tongue papillae and throat irritation is most likely related to her increase intake of acidic foods and poor nutritional intake since she is not able to tolerate  many foods. Advise to avoid acidic foods.  Trial of PPI x several days 14 day trial of Diflucan if PPI does not work Appt with Dr. Loreta Ave if no improvement for further evaluation F/u prn    Samia Kukla PHUONG, DO 02/20/2013 10:05 AM

## 2013-04-08 IMAGING — NM NM HEPATO W/GB/PHARM/[PERSON_NAME]
3 series · 18 of 18 positions shown · non-contrast
Comparison: None.

CLINICAL DATA: Nausea and pain

NUCLEAR MEDICINE HEPATOBILIARY WITH GB, PHARM AND QUAN
MEASURE/ejection fraction analysis
Radiopharmaceutical: Technetium 99m Choletec
Dose:  5.0 mCi
Route of administration:  Intravenous
Views:  Anterior, right lateral right upper quadrant

[Series 0: hepatobiliary · 3.20mm/px · 6 of 9 frames shown (1 of 3)]
[frame 1/9]
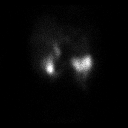
[frame 3/9]
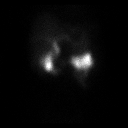
[frame 4/9]
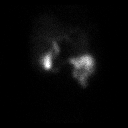
[frame 6/9]
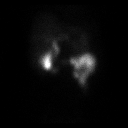
[frame 7/9]
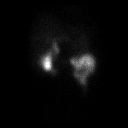
[frame 9/9]
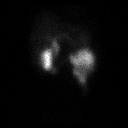

[Series 0: hepatobiliary · 3.20mm/px · 6 of 60 frames shown (2 of 3)]
[frame 6/60]
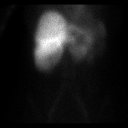
[frame 16/60]
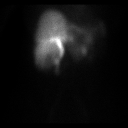
[frame 26/60]
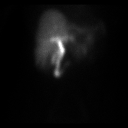
[frame 36/60]
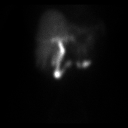
[frame 46/60]
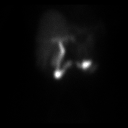
[frame 56/60]
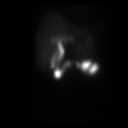

[Series 0: hepatobiliary · 3.20mm/px · 6 of 30 frames shown (3 of 3)]
[frame 3/30]
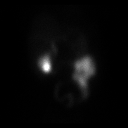
[frame 8/30]
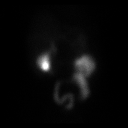
[frame 13/30]
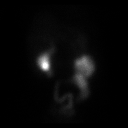
[frame 18/30]
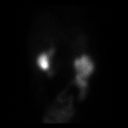
[frame 23/30]
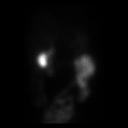
[frame 28/30]
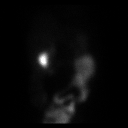

[18 of 18 positions shown; findings below may reference images not displayed]

FINDINGS: Liver uptake of radiotracer is normal.  There is prompt
visualization of gallbladder and small bowel, indicating patency of
the cystic and common bile ducts.

A weight-based dose, 1.4 mcg, of CCK was administered intravenously
with calculation of the computer-generated ejection fraction of
radiotracer from the gallbladder.  The ejection fraction of
radiotracer from the gallbladder is abnormally low at 30.9%, normal
greater than 38%.
IMPRESSION: Diminished ejection fraction of radiotracer from the
gallbladder.  This finding raises concern for underlying biliary
dyskinesia.  Cystic and common bile ducts are patent as is
evidenced by visualization of gallbladder and small bowel.

## 2013-08-16 ENCOUNTER — Other Ambulatory Visit: Payer: Self-pay

## 2013-12-26 ENCOUNTER — Encounter: Payer: Self-pay | Admitting: Family Medicine

## 2013-12-27 ENCOUNTER — Encounter: Payer: Self-pay | Admitting: Family Medicine

## 2014-03-29 DIAGNOSIS — G43709 Chronic migraine without aura, not intractable, without status migrainosus: Secondary | ICD-10-CM | POA: Insufficient documentation

## 2014-05-18 ENCOUNTER — Ambulatory Visit (INDEPENDENT_AMBULATORY_CARE_PROVIDER_SITE_OTHER): Payer: BC Managed Care – PPO | Admitting: Family Medicine

## 2014-05-18 VITALS — BP 100/76 | HR 98 | Temp 98.5°F | Resp 12 | Ht 64.0 in | Wt 179.0 lb

## 2014-05-18 DIAGNOSIS — R059 Cough, unspecified: Secondary | ICD-10-CM

## 2014-05-18 DIAGNOSIS — R05 Cough: Secondary | ICD-10-CM

## 2014-05-18 DIAGNOSIS — R062 Wheezing: Secondary | ICD-10-CM

## 2014-05-18 MED ORDER — PREDNISONE 20 MG PO TABS: ORAL_TABLET | ORAL | Status: DC

## 2014-05-18 MED ORDER — ALBUTEROL SULFATE HFA 108 (90 BASE) MCG/ACT IN AERS: 2.0000 | INHALATION_SPRAY | RESPIRATORY_TRACT | Status: DC | PRN

## 2014-05-18 MED ORDER — ALBUTEROL SULFATE (2.5 MG/3ML) 0.083% IN NEBU
2.5000 mg | INHALATION_SOLUTION | Freq: Once | RESPIRATORY_TRACT | Status: AC
  Administered 2014-05-18: 2.5 mg via RESPIRATORY_TRACT

## 2014-05-18 MED ORDER — BENZONATATE 100 MG PO CAPS: 100.0000 mg | ORAL_CAPSULE | Freq: Three times a day (TID) | ORAL | Status: DC | PRN

## 2014-05-18 NOTE — Patient Instructions (Addendum)
Drink plenty of fluids  Use the albuterol inhaler 2 inhalations every 4-6 hours as needed  Prednisone 20 mg 3 daily for 2 days, then 2 for 2 days, then 1 for 2 days.  Take Tessalon one or 2 pills every 6-8 hours as needed for coughing  Return if worse or not improving

## 2014-05-18 NOTE — Progress Notes (Signed)
Subjective: 48 year old lady who is here with a two-week history of having had a mild cough and its onset. He gradually got worse. She's coughing a lot in the daytime. Can't seem to get relief from it. However at night she doesn't cough a lot. She props up a couple of pillows and has a slight cough. She has some expiratory wheezing. She said when it started she had swollen tenderness in the nodes in her neck. She was not feverish. She does not smoke. She is generally pretty healthy. She has had some stress her teenage kids. She works at home. She had a partial hysterectomy so does not have menstrual cycles longer. Has her ovaries. She has a history of idiopathic gastroparesis for which she occasionally takes Marinol. She is on Wellbutrin. She has a long history of migraines and takes a new migraine medication.  Objective: Pleasant lady, healthy. TMs normal. Throat clear. Neck supple without significant nodes. Chest slightly coarse respirations. On forced exhalation there is a marked expiratory wheeze.  Peak flow was 330 at best, with a predicted value of 430.  Patient was given nebulizer treatment and it really didn't help her a lot but she felt a little bit better. Peak flow is still 320  Assessment: Wheezing and cough, reactive airways probably due to a virus.  Plan: Prednisone Albuterol inhaler Tessalon Return when necessary

## 2014-05-22 ENCOUNTER — Ambulatory Visit (INDEPENDENT_AMBULATORY_CARE_PROVIDER_SITE_OTHER): Payer: BC Managed Care – PPO

## 2014-05-22 ENCOUNTER — Ambulatory Visit (INDEPENDENT_AMBULATORY_CARE_PROVIDER_SITE_OTHER): Payer: BC Managed Care – PPO | Admitting: Emergency Medicine

## 2014-05-22 VITALS — BP 98/62 | HR 97 | Temp 98.0°F | Resp 17 | Ht 64.5 in | Wt 180.0 lb

## 2014-05-22 DIAGNOSIS — R05 Cough: Secondary | ICD-10-CM

## 2014-05-22 DIAGNOSIS — R062 Wheezing: Secondary | ICD-10-CM

## 2014-05-22 DIAGNOSIS — R059 Cough, unspecified: Secondary | ICD-10-CM

## 2014-05-22 MED ORDER — ALBUTEROL SULFATE (2.5 MG/3ML) 0.083% IN NEBU: 2.5000 mg | INHALATION_SOLUTION | Freq: Once | RESPIRATORY_TRACT | Status: DC

## 2014-05-22 MED ORDER — BENZONATATE 100 MG PO CAPS: 100.0000 mg | ORAL_CAPSULE | Freq: Three times a day (TID) | ORAL | Status: DC | PRN

## 2014-05-22 MED ORDER — PREDNISONE 20 MG PO TABS: ORAL_TABLET | ORAL | Status: DC

## 2014-05-22 NOTE — Progress Notes (Addendum)
Subjective:  This chart was scribed for Darlyne Russian, MD by Ladene Artist, ED Scribe. The patient was seen in room 9. Patient's care was started at 11:45 AM.   Patient ID: [name], female    DOB: April 04, 1966, 48 y.o.   MRN: [id number]  Chief Complaint  Patient presents with  . Follow-up    cough, shortness of breath, burning in lungs    HPI HPI Comments: [name] is a 48 y.o. female, with a h/o Gastroparesis, who presents to the [facility] for follow-up regarding wheezing and cough. Pt saw Dr. Linna Darner 05/18/14, was treated with nebulizer and started prednisone, albuterol inahler and Tessalon. Pt states that she tried using the inhaler at home and reports a burning sensation in her throat and SOB. She states that she tried the inhaler again 2 days later without complications. Pt reports taking all medications as prescribed but has not noticed any improvement in wheezing or cough. No h/o similar episodes. Pt denies reflux with symptoms. She denies traveling and new pets. Pt also denies possibility of pregnancy; partial hysterectomy, still has ovaries. No sick contacts.   Past Medical History  Diagnosis Date  . PONV (postoperative nausea and vomiting)   . Dizziness   . Weight decrease   . Migraine     [facility]  . Allergy    Current Outpatient Prescriptions on File Prior to Visit  Medication Sig Dispense Refill  . albuterol (PROVENTIL HFA;VENTOLIN HFA) 108 (90 BASE) MCG/ACT inhaler Inhale 2 puffs into the lungs every 4 (four) hours as needed for wheezing or shortness of breath (cough, shortness of breath or wheezing.).  1 Inhaler  1  . benzonatate (TESSALON) 100 MG capsule Take 1-2 capsules (100-200 mg total) by mouth 3 (three) times daily as needed.  30 capsule  0  . buPROPion (WELLBUTRIN XL) 150 MG 24 hr tablet Take 450 mg by mouth every morning.      . dronabinol (MARINOL) 2.5 MG capsule Take 2.5 mg by mouth as needed.      . predniSONE  (DELTASONE) 20 MG tablet 3 daily for 2 days, then 2 daily for 2 days, then one daily for 2 days for wheezing  12 tablet  0  . rizatriptan (MAXALT) 10 MG tablet Take 10 mg by mouth as needed. May repeat in 2 hours if needed For migraines       No current facility-administered medications on file prior to visit.   Allergies  Allergen Reactions  . Codeine Nausea And Vomiting    Severe Vomiting for days.  All forms of codeine, including synthetic.   Marland Kitchen Erythromycin Rash   Review of Systems  Respiratory: Positive for cough, shortness of breath and wheezing.       Objective:   Physical Exam CONSTITUTIONAL: Well developed/well nourished, not ill appearing or toxic HEAD: Normocephalic/atraumatic EYES: EOMI/PERRL ENMT: Mucous membranes moist NECK: supple no meningeal signs SPINE:entire spine nontender CV: S1/S2 noted, no murmurs/rubs/gallops noted LUNGS: Lungs are clear to auscultation bilaterally, no apparent distress, wheezing on end expiration ABDOMEN: soft, nontender, no rebound or guarding GU:no cva tenderness NEURO: Pt is awake/alert, moves all extremitiesx4 EXTREMITIES: pulses normal, full ROM SKIN: warm, color normal PSYCH: no abnormalities of mood noted [facility] reading (PRIMARY) by  Dr.Kaushik Maul no acute disease no infiltrates Meds ordered this encounter  Medications  . albuterol (PROVENTIL) (2.5 MG/3ML) 0.083% nebulizer solution 2.5 mg    Sig:   . benzonatate (TESSALON) 100 MG  capsule    Sig: Take 1-2 capsules (100-200 mg total) by mouth 3 (three) times daily as needed.    Dispense:  30 capsule    Refill:  0  . predniSONE (DELTASONE) 20 MG tablet    Sig: 3 daily for 2 days, then 2 daily for 2 days, then one daily for 2 days for wheezing    Dispense:  12 tablet    Refill:  0      Assessment & Plan:  We'll treat with another prednisone taper. She'll continue her Tessalon Perles. She can use her inhaler if needed however she did not improve much after her nebulizer here. I  personally performed the services described in this documentation, which was scribed in my presence. The recorded information has been reviewed and is accurate.

## 2014-05-22 NOTE — Patient Instructions (Signed)
Cough, Adult  A cough is a reflex that helps clear your throat and airways. It can help heal the body or may be a reaction to an irritated airway. A cough may only last 2 or 3 weeks (acute) or may last more than 8 weeks (chronic).  CAUSES Acute cough:  Viral or bacterial infections. Chronic cough:  Infections.  Allergies.  Asthma.  Post-nasal drip.  Smoking.  Heartburn or acid reflux.  Some medicines.  Chronic lung problems (COPD).  Cancer. SYMPTOMS   Cough.  Fever.  Chest pain.  Increased breathing rate.  High-pitched whistling sound when breathing (wheezing).  Colored mucus that you cough up (sputum). TREATMENT   A bacterial cough may be treated with antibiotic medicine.  A viral cough must run its course and will not respond to antibiotics.  Your caregiver may recommend other treatments if you have a chronic cough. HOME CARE INSTRUCTIONS   Only take over-the-counter or prescription medicines for pain, discomfort, or fever as directed by your caregiver. Use cough suppressants only as directed by your caregiver.  Use a cold steam vaporizer or humidifier in your bedroom or home to help loosen secretions.  Sleep in a semi-upright position if your cough is worse at night.  Rest as needed.  Stop smoking if you smoke. SEEK IMMEDIATE MEDICAL CARE IF:   You have pus in your sputum.  Your cough starts to worsen.  You cannot control your cough with suppressants and are losing sleep.  You begin coughing up blood.  You have difficulty breathing.  You develop pain which is getting worse or is uncontrolled with medicine.  You have a fever. MAKE SURE YOU:   Understand these instructions.  Will watch your condition.  Will get help right away if you are not doing well or get worse. Document Released: 03/26/2011 Document Revised: 12/20/2011 Document Reviewed: 03/26/2011 ExitCare Patient Information 2015 [employer]. This information is not intended  to replace advice given to you by your health care provider. Make sure you discuss any questions you have with your health care provider.  

## 2014-07-10 ENCOUNTER — Ambulatory Visit (INDEPENDENT_AMBULATORY_CARE_PROVIDER_SITE_OTHER): Payer: BC Managed Care – PPO

## 2014-07-10 ENCOUNTER — Ambulatory Visit (INDEPENDENT_AMBULATORY_CARE_PROVIDER_SITE_OTHER): Payer: BC Managed Care – PPO | Admitting: Family Medicine

## 2014-07-10 VITALS — BP 114/82 | HR 89 | Temp 98.1°F | Resp 16 | Ht 64.0 in | Wt 190.2 lb

## 2014-07-10 DIAGNOSIS — H60399 Other infective otitis externa, unspecified ear: Secondary | ICD-10-CM

## 2014-07-10 DIAGNOSIS — H9209 Otalgia, unspecified ear: Secondary | ICD-10-CM

## 2014-07-10 DIAGNOSIS — M79641 Pain in right hand: Secondary | ICD-10-CM

## 2014-07-10 DIAGNOSIS — J019 Acute sinusitis, unspecified: Secondary | ICD-10-CM

## 2014-07-10 DIAGNOSIS — H6120 Impacted cerumen, unspecified ear: Secondary | ICD-10-CM

## 2014-07-10 DIAGNOSIS — H60391 Other infective otitis externa, right ear: Secondary | ICD-10-CM

## 2014-07-10 DIAGNOSIS — M79609 Pain in unspecified limb: Secondary | ICD-10-CM

## 2014-07-10 DIAGNOSIS — H6121 Impacted cerumen, right ear: Secondary | ICD-10-CM

## 2014-07-10 DIAGNOSIS — H9201 Otalgia, right ear: Secondary | ICD-10-CM

## 2014-07-10 MED ORDER — CEFDINIR 300 MG PO CAPS: 600.0000 mg | ORAL_CAPSULE | Freq: Every day | ORAL | Status: DC

## 2014-07-10 MED ORDER — DICLOFENAC SODIUM 75 MG PO TBEC: 75.0000 mg | DELAYED_RELEASE_TABLET | Freq: Two times a day (BID) | ORAL | Status: DC

## 2014-07-10 MED ORDER — OFLOXACIN 0.3 % OT SOLN: 5.0000 [drp] | Freq: Two times a day (BID) | OTIC | Status: DC

## 2014-07-10 NOTE — Progress Notes (Signed)
Subjective: Patient is here for several things. She's been having intense pain in her right ear.She touches her we will see your it hurts a lot. She also been having a lot of pain at the base of the right second metacarpal in the right hand. No injury or cause known.  She's had some persistent sinus symptoms.  Objective: TMs normal. A little wax at the base of the right ear canal. Could not see any erythema of the canal. Very tender to touch and to move the pinna. Throat clear. Neck supple. Sinuses nontender. No nodes in the neck. Chest clear. Heart regular without murmurs. And at the base of the right second metacarpal.  Assessment: Right hand pain Otalgia right ear canal sinusitis   Plan:  Procedure: Under direct visualization of the curetted out a plug of wax. There is a little red area just deep to that.   Advised that is probably where the pain is coming from. Will treat with antibiotic drops and nonsteroidal anti-inflammatory medications. Insight may help with the hand and the ear. Return if problems

## 2014-07-10 NOTE — Patient Instructions (Signed)
Take the diclofenac one twice daily for pain and inflammation in the hand and ear  Continue using the Flonase (fluticasone)  Use the ofloxacin ear drops 5 drops in right ear twice daily  Take the [facility] antibiotic one twice daily  Return if worse or not improving. You might occur being seen by orthopedic hand specialist to work on that area of pain if it persists.

## 2014-09-23 ENCOUNTER — Other Ambulatory Visit: Payer: Self-pay | Admitting: Physician Assistant

## 2014-09-24 NOTE — Telephone Encounter (Signed)
Dr Linna Darner, do you want to RF or have pt RTC?

## 2014-09-27 NOTE — Telephone Encounter (Signed)
Return before any future prescriptions.

## 2014-10-01 NOTE — Telephone Encounter (Signed)
Notified pt about RF and f/up. Pt agreed.

## 2015-08-26 ENCOUNTER — Ambulatory Visit (HOSPITAL_COMMUNITY)
Admission: RE | Admit: 2015-08-26 | Discharge: 2015-08-26 | Disposition: A | Discharge status: Home / Self Care | Payer: 59 | Source: Ambulatory Visit | Attending: Cardiovascular Disease | Admitting: Cardiovascular Disease

## 2015-08-26 ENCOUNTER — Other Ambulatory Visit (HOSPITAL_COMMUNITY): Payer: Self-pay | Admitting: Orthopedic Surgery

## 2015-08-26 DIAGNOSIS — M7989 Other specified soft tissue disorders: Secondary | ICD-10-CM | POA: Diagnosis not present

## 2015-08-28 ENCOUNTER — Other Ambulatory Visit (HOSPITAL_COMMUNITY): Payer: Self-pay | Admitting: Physician Assistant

## 2015-12-05 ENCOUNTER — Ambulatory Visit (INDEPENDENT_AMBULATORY_CARE_PROVIDER_SITE_OTHER): Payer: 59 | Admitting: Family Medicine

## 2015-12-05 VITALS — BP 110/74 | HR 80 | Temp 98.2°F | Resp 16 | Ht 64.0 in | Wt 192.0 lb

## 2015-12-05 DIAGNOSIS — H9201 Otalgia, right ear: Secondary | ICD-10-CM

## 2015-12-05 DIAGNOSIS — J309 Allergic rhinitis, unspecified: Secondary | ICD-10-CM

## 2015-12-05 MED ORDER — OFLOXACIN 0.3 % OT SOLN: 10.0000 [drp] | Freq: Every day | OTIC | Status: DC

## 2015-12-05 NOTE — Progress Notes (Signed)
Subjective:    Patient ID: [name], female    DOB: 1966-08-16, 50 y.o.   MRN: [id number]  12/05/2015  Ear Pain   HPI This 50 y.o. female presents for evaluation of R ear pain.  Has suffered with cerumen impaction in the past.  Applied Debrox two nights ago.  No pain relief with Debrox; no cerumen removed.  Ear pain has worsened after Debrox.  Went to [place] [employer] this past weekend.  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  When pulls on ear, pain worsens.  Pain with palpation around the ear. Having to take Motrin.  Daughter sick with the flu; daughter is vomiting.    Review of Systems  Constitutional: Negative for fever, chills, diaphoresis and fatigue.  HENT: Positive for ear pain and postnasal drip. Negative for congestion, drooling, ear discharge, facial swelling, hearing loss, rhinorrhea, sinus pressure, sneezing, sore throat, tinnitus, trouble swallowing and voice change.   Respiratory: Negative for cough.   Skin: Negative for color change, rash and wound.  Neurological: Negative for dizziness and headaches.    Past Medical History  Diagnosis Date  . PONV (postoperative nausea and vomiting)   . Dizziness   . Weight decrease   . Migraine     [facility]  . Allergy   . Gastroparesis     [facility]   Past Surgical History  Procedure Laterality Date  . Brachiplasty      s/p weight loss   . Abdominal hysterectomy    . Abdominalplasty      s/p weight loss  . Tonsillectomy    . Knee arthroscopy w/ acl reconstruction      in early 1990's  . Cholecystectomy  09/04/2012    Procedure: LAPAROSCOPIC CHOLECYSTECTOMY;  Surgeon: Harl Bowie, MD;  Location: [facility];  Service: General;  Laterality: N/A;  . Cosmetic surgery    . Colonoscopy  08/21/12    few scattered sigmoid diverticula: one sessile right colon polyp removed by cold biopsies x2:  patchy erythema in the left colon multiple random biopsies done:  normal therminal ileum   Allergies  Allergen  Reactions  . Codeine Nausea And Vomiting    Severe Vomiting for days.  All forms of codeine, including synthetic.   Marland Kitchen Erythromycin Rash    Social History   Social History  . Marital Status: Married    Spouse Name: N/A  . Number of Children: N/A  . Years of Education: N/A   Occupational History  . Not on file.   Social History Main Topics  . Smoking status: Never Smoker   . Smokeless tobacco: Not on file  . Alcohol Use: No  . Drug Use: No  . Sexual Activity: Not on file   Other Topics Concern  . Not on file   Social History Narrative   Family History  Problem Relation Age of Onset  . Diabetes Mother   . Hypertension Mother   . Thyroid disease Mother   . Cancer Father   . Diabetes Father   . Hypertension Father   . Thyroid disease Sister   . Diabetes Brother   . Diabetes Maternal Grandmother   . Cancer Maternal Grandfather        Objective:    BP 110/74 mmHg  Pulse 80  Temp(Src) 98.2 F (36.8 C)  Resp 16  Ht 5\' 4"  (1.626 m)  Wt 192 lb (87.091 kg)  BMI 32.94 kg/m2  SpO2 99% Physical Exam  Constitutional: She is  oriented to person, place, and time. She appears well-developed and well-nourished. No distress.  HENT:  Head: Normocephalic and atraumatic.  Right Ear: Ear canal normal. No lacerations. No drainage, swelling or tenderness. No foreign bodies. No mastoid tenderness. Tympanic membrane is scarred. Tympanic membrane is not injected, not erythematous, not retracted and not bulging. No middle ear effusion. No hemotympanum. No decreased hearing is noted.  Left Ear: External ear normal.  Ears:  Nose: Nose normal.  Mouth/Throat: Oropharynx is clear and moist.  R ear:  +canal with small amount of cerumen that was removed; mild erythema of ear canal; TM pearly gray yet has a central area of opaque lesion as marked.  No mastoid TTP.  Eyes: Conjunctivae are normal. Pupils are equal, round, and reactive to light.  Neck: Normal range of motion. Neck supple. No  thyromegaly present.  Cardiovascular: Normal rate, regular rhythm and normal heart sounds.  Exam reveals no gallop and no friction rub.   No murmur heard. Pulmonary/Chest: Effort normal and breath sounds normal. She has no wheezes. She has no rales.  Lymphadenopathy:       Head (right side): No submental, no submandibular, no tonsillar, no preauricular, no posterior auricular and no occipital adenopathy present.       Head (left side): No submental, no submandibular, no tonsillar, no preauricular, no posterior auricular and no occipital adenopathy present.    She has no cervical adenopathy.  Neurological: She is alert and oriented to person, place, and time.  Skin: She is not diaphoretic.  Psychiatric: She has a normal mood and affect. Her behavior is normal.  Nursing note and vitals reviewed.       Assessment & Plan:   1. Otalgia, right   2. Allergic rhinitis, unspecified allergic rhinitis type    -New. -Etiology unclear at this time. -treat for early/mild otitis externa with floxin otic. -if no improvement in 3 days, recommend ENT evaluation due to TM scar/lesion. -no lymphadenopathy associated with symptoms; no recent congestion yet chronic allergic rhinitis; continue allergy medications/Singulair. -severity of symptoms not consistent with exam. -continue Ibuprofen yet increase to 800mg  tid PRN.   Orders Placed This Encounter  Procedures  . Ambulatory referral to ENT    Referral Priority:  Routine    Referral Type:  Consultation    Referral Reason:  Specialty Services Required    Requested Specialty:  Otolaryngology    Number of Visits Requested:  1   Meds ordered this encounter  Medications  . promethazine (PHENERGAN) 25 MG tablet    Sig: Take 25 mg by mouth every 6 (six) hours as needed for nausea or vomiting.  . lamoTRIgine (LAMICTAL) 200 MG tablet    Sig: Take 200 mg by mouth daily.  Marland Kitchen ofloxacin (FLOXIN) 0.3 % otic solution    Sig: Place 10 drops into the right  ear daily.    Dispense:  10 mL    Refill:  0    No Follow-up on file.    Nattie Lazenby Elayne Guerin, M.D. [facility] [address] [place], [place]  [address] [phone] phone [phone] fax

## 2015-12-05 NOTE — Patient Instructions (Signed)
Earache An earache, also called otalgia, can be caused by many things. Pain from an earache can be sharp, dull, or burning. The pain may be temporary or constant. Earaches can be caused by problems with the ear, such as infection in either the middle ear or the ear canal, injury, impacted ear wax, middle ear pressure, or a foreign body in the ear. Ear pain can also result from problems in other areas. This is called referred pain. For example, pain can come from a sore throat, a tooth infection, or problems with the jaw or the joint between the jaw and the skull (temporomandibular joint, or TMJ). The cause of an earache is not always easy to identify. Watchful waiting may be appropriate for some earaches until a clear cause of the pain can be found. HOME CARE INSTRUCTIONS Watch your condition for any changes. The following actions may help to lessen any discomfort that you are feeling:  Take medicines only as directed by your health care provider. This includes ear drops.  Apply ice to your outer ear to help reduce pain.  Put ice in a plastic bag.  Place a towel between your skin and the bag.  Leave the ice on for 20 minutes, 2-3 times per day.  Do not put anything in your ear other than medicine that is prescribed by your health care provider.  Try resting in an upright position instead of lying down. This may help to reduce pressure in the middle ear and relieve pain.  Chew gum if it helps to relieve your ear pain.  Control any allergies that you have.  Keep all follow-up visits as directed by your health care provider. This is important. SEEK MEDICAL CARE IF:  Your pain does not improve within 2 days.  You have a fever.  You have new or worsening symptoms. SEEK IMMEDIATE MEDICAL CARE IF:  You have a severe headache.  You have a stiff neck.  You have difficulty swallowing.  You have redness or swelling behind your ear.  You have drainage from your ear.  You have hearing  loss.  You feel dizzy.   This information is not intended to replace advice given to you by your health care provider. Make sure you discuss any questions you have with your health care provider.   Document Released: 05/14/2004 Document Revised: 10/18/2014 Document Reviewed: 04/28/2014 Elsevier Interactive Patient Education 2016 [employer]vier Inc[employer]  

## 2017-04-11 ENCOUNTER — Other Ambulatory Visit: Payer: Self-pay

## 2017-05-30 ENCOUNTER — Emergency Department (HOSPITAL_COMMUNITY): Payer: 59

## 2017-05-30 ENCOUNTER — Other Ambulatory Visit: Payer: Self-pay

## 2017-05-30 ENCOUNTER — Encounter (HOSPITAL_COMMUNITY): Payer: Self-pay | Admitting: *Deleted

## 2017-05-30 ENCOUNTER — Emergency Department (HOSPITAL_COMMUNITY)
Admission: EM | Admit: 2017-05-30 | Discharge: 2017-05-31 | Disposition: A | Discharge status: Home / Self Care | Payer: 59 | Attending: Emergency Medicine | Admitting: Emergency Medicine

## 2017-05-30 DIAGNOSIS — R0789 Other chest pain: Secondary | ICD-10-CM | POA: Insufficient documentation

## 2017-05-30 DIAGNOSIS — R0602 Shortness of breath: Secondary | ICD-10-CM | POA: Diagnosis not present

## 2017-05-30 DIAGNOSIS — R079 Chest pain, unspecified: Secondary | ICD-10-CM

## 2017-05-30 DIAGNOSIS — M79605 Pain in left leg: Secondary | ICD-10-CM | POA: Insufficient documentation

## 2017-05-30 DIAGNOSIS — Z79899 Other long term (current) drug therapy: Secondary | ICD-10-CM | POA: Diagnosis not present

## 2017-05-30 LAB — BASIC METABOLIC PANEL
Anion gap: 10 (ref 5–15)
BUN: 12 mg/dL (ref 6–20)
CHLORIDE: 102 mmol/L (ref 101–111)
CO2: 28 mmol/L (ref 22–32)
Calcium: 9.9 mg/dL (ref 8.9–10.3)
Creatinine, Ser: 1.04 mg/dL — ABNORMAL HIGH (ref 0.44–1.00)
GFR calc non Af Amer: >60 mL/min (ref >60)
Glucose, Bld: 100 mg/dL — ABNORMAL HIGH (ref 65–99)
POTASSIUM: 4.7 mmol/L (ref 3.5–5.1)
SODIUM: 140 mmol/L (ref 135–145)

## 2017-05-30 LAB — CBC
HEMATOCRIT: 43.8 % (ref 36.0–46.0)
HEMOGLOBIN: 14.7 g/dL (ref 12.0–15.0)
MCH: 32.5 pg (ref 26.0–34.0)
MCHC: 33.6 g/dL (ref 30.0–36.0)
MCV: 96.9 fL (ref 78.0–100.0)
Platelets: 284 10*3/uL (ref 150–400)
RBC: 4.52 MIL/uL (ref 3.87–5.11)
RDW: 12.7 % (ref 11.5–15.5)
WBC: 12.7 10*3/uL — AB (ref 4.0–10.5)

## 2017-05-30 LAB — I-STAT TROPONIN, ED
TROPONIN I, POC: 0 ng/mL (ref 0.00–0.08)
Troponin i, poc: 0.01 ng/mL (ref 0.00–0.08)

## 2017-05-30 LAB — D-DIMER, QUANTITATIVE (NOT AT ARMC): D DIMER QUANT: 0.51 ug{FEU}/mL — AB (ref 0.00–0.50)

## 2017-05-30 MED ORDER — ASPIRIN 81 MG PO CHEW
324.0000 mg | CHEWABLE_TABLET | Freq: Once | ORAL | Status: AC
  Administered 2017-05-30: 324 mg via ORAL
  Filled 2017-05-30: qty 4

## 2017-05-30 MED ORDER — IBUPROFEN 100 MG/5ML PO SUSP
600.0000 mg | Freq: Once | ORAL | Status: DC
  Filled 2017-05-30: qty 30

## 2017-05-30 MED ORDER — IOPAMIDOL (ISOVUE-370) INJECTION 76%
INTRAVENOUS | Status: AC
  Administered 2017-05-30: 100 mL
  Filled 2017-05-30: qty 100

## 2017-05-30 NOTE — ED Provider Notes (Signed)
[facility] DEPT Provider Note   CSN: [id number] Arrival date & time: 05/30/17  1613     History   Chief Complaint Chief Complaint  Patient presents with  . Chest Pain    HPI [name] is a 51 y.o. female.  The history is provided by the patient.  Chest Pain   This is a new problem. The current episode started 6 to 12 hours ago. The problem occurs hourly. The problem has been gradually worsening. The pain is associated with rest. The pain is present in the substernal region. The pain is moderate. The quality of the pain is described as pressure-like. The pain radiates to the upper back. Associated symptoms include shortness of breath. Pertinent negatives include no abdominal pain, no exertional chest pressure, no fever, no leg pain, no lower extremity edema, no near-syncope and no syncope. She has tried nothing for the symptoms. The treatment provided no relief. Risk factors include obesity.     51 year old female who presents with Chest pain. She has a history of idiopathic gastroparesis, migraine headaches, anxiety. States onset of intermittent chest pain that started earlier today. States that symptoms would last for several minutes before self resolving. Occurs primarily at rest. Denies any exertional chest pain. States that symptoms have gradually been worsening over the course of today. Initially had just a sharp chest pain in the center of her chest, but now feels like there is a tight band around her chest that radiates towards her scapula he had associated with some shortness of breath, but states that mainly she feels like she needs to take a deep breath. She denies any nausea, vomiting, diarrhea, abdominal pain. No diaphoresis. No recent infection such as fevers or chills. Did not take any medications or treatments prior to arrival. No alleviating or aggravating factors noted. Denies leg swelling or calf tenderness. No history of blood clots or family history of blood clots  or early cardiac disease.  Past Medical History:  Diagnosis Date  . Allergy   . Dizziness   . Gastroparesis    [facility]  . Migraine    [facility]  . PONV (postoperative nausea and vomiting)   . Weight decrease     Patient Active Problem List   Diagnosis Date Noted  . Biliary dyskinesia 08/28/2012  . Tobacco use 07/02/2012  . Migraine headache 07/02/2012    Past Surgical History:  Procedure Laterality Date  . ABDOMINAL HYSTERECTOMY    . abdominalplasty     s/p weight loss  . brachiplasty     s/p weight loss   . CHOLECYSTECTOMY  09/04/2012   Procedure: LAPAROSCOPIC CHOLECYSTECTOMY;  Surgeon: Harl Bowie, MD;  Location: [facility];  Service: General;  Laterality: N/A;  . COLONOSCOPY  08/21/12   few scattered sigmoid diverticula: one sessile right colon polyp removed by cold biopsies x2:  patchy erythema in the left colon multiple random biopsies done:  normal therminal ileum  . COSMETIC SURGERY    . KNEE ARTHROSCOPY W/ ACL RECONSTRUCTION     in early 1990's  . TONSILLECTOMY      OB History    No data available       Home Medications    Prior to Admission medications   Medication Sig Start Date End Date Taking? Authorizing Provider  dexlansoprazole (DEXILANT) 60 MG capsule Take 60 mg by mouth daily.   Yes [provider]  dronabinol (MARINOL) 10 MG capsule Take 10 mg by mouth 2 (two)  times daily as needed (nausea and vomiting from gastroparesis).  05/05/17  Yes [provider]  escitalopram (LEXAPRO) 20 MG tablet Take 20 mg by mouth at bedtime. 05/26/17  Yes [provider]  gabapentin (NEURONTIN) 600 MG tablet Take 600 mg by mouth daily as needed. 05/25/17  Yes [provider]  Ibuprofen-Diphenhydramine Cit (MOTRIN PM) 200-38 MG TABS Take 3 tablets by mouth at bedtime as needed (pain).   Yes [provider]  Polyethyl Glycol-Propyl Glycol (SYSTANE OP) Place 1 drop into both eyes 2 (two) times daily as needed  (dry eyes).   Yes [provider]  promethazine (PHENERGAN) 25 MG tablet Take 25 mg by mouth daily as needed for nausea or vomiting.    Yes [provider]  rizatriptan (MAXALT) 10 MG tablet Take 10 mg by mouth See admin instructions. Take 1 tablet (10 mg) by mouth as needed for migraine headache, May repeat in 2 hours if needed   Yes [provider]  silver sulfADIAZINE (SILVADENE) 1 % cream Apply 1 application topically at bedtime as needed (wound care/leg).   Yes [provider]  albuterol (PROVENTIL HFA;VENTOLIN HFA) 108 (90 BASE) MCG/ACT inhaler Inhale 2 puffs into the lungs every 4 (four) hours as needed for wheezing or shortness of breath (cough, shortness of breath or wheezing.). Patient not taking: Reported on 12/05/2015 05/18/14   Posey Boyer, MD    Family History Family History  Problem Relation Age of Onset  . Diabetes Mother   . Hypertension Mother   . Thyroid disease Mother   . Cancer Father   . Diabetes Father   . Hypertension Father   . Thyroid disease Sister   . Diabetes Brother   . Diabetes Maternal Grandmother   . Cancer Maternal Grandfather     Social History Social History  Substance Use Topics  . Smoking status: Never Smoker  . Smokeless tobacco: Never Used  . Alcohol use No     Allergies   Codeine; Ondansetron; Oxycodone-acetaminophen; Penicillins; and Erythromycin   Review of Systems Review of Systems  Constitutional: Negative for fever.  Respiratory: Positive for shortness of breath.   Cardiovascular: Positive for chest pain. Negative for syncope and near-syncope.  Gastrointestinal: Negative for abdominal pain.  All other systems reviewed and are negative.    Physical Exam Updated Vital Signs BP 125/67   Pulse 79   Temp 98 F (36.7 C) (Oral)   Resp 18   Ht 5\' 4"  (1.626 m)   Wt 87.1 kg (192 lb)   SpO2 100%   BMI 32.96 kg/m   Physical Exam Physical Exam  Nursing note and vitals  reviewed. Constitutional: Well developed, well nourished, non-toxic, and in no acute distress Head: Normocephalic and atraumatic.  Mouth/Throat: Oropharynx is clear and moist.  Neck: Normal range of motion. Neck supple.  Cardiovascular: Normal rate and regular rhythm.   Pulmonary/Chest: Effort normal and breath sounds normal.  Abdominal: Soft. There is no tenderness. There is no rebound and no guarding.  Musculoskeletal: Normal range of motion.  Neurological: Alert, no facial droop, fluent speech, moves all extremities symmetrically Skin: Skin is warm and dry.  Psychiatric: Cooperative   ED Treatments / Results  Labs (all labs ordered are listed, but only abnormal results are displayed) Labs Reviewed  BASIC METABOLIC PANEL - Abnormal; Notable for the following:       Result Value   Glucose, Bld 100 (*)    Creatinine, Ser 1.04 (*)    All other  components within normal limits  CBC - Abnormal; Notable for the following:    WBC 12.7 (*)    All other components within normal limits  D-DIMER, QUANTITATIVE (NOT AT [facility]) - Abnormal; Notable for the following:    D-Dimer, Quant 0.51 (*)    All other components within normal limits  I-STAT TROPONIN, ED  I-STAT TROPONIN, ED    EKG  EKG Interpretation  Date/Time:  Monday May 30 2017 16:20:03 EDT Ventricular Rate:  90 PR Interval:  170 QRS Duration: 82 QT Interval:  362 QTC Calculation: 442 R Axis:   19 Text Interpretation:  Normal sinus rhythm Low voltage QRS Borderline ECG no prior EKG  Confirmed by Brantley Stage [phone]) on 05/30/2017 8:46:16 PM       Radiology Dg Chest 2 View  Result Date: 05/30/2017 CLINICAL DATA:  Intermittent episodes of mid chest pain radiating into the jaw all and bilateral shoulder blades. Symptoms are associated with shortness of breath. No cardiac history. Nonsmoker. EXAM: CHEST  2 VIEW COMPARISON:  PA and lateral chest x-ray of May 22, 2014 FINDINGS: The lungs are adequately inflated. There is  chronic tenting of the left hemidiaphragm. There is no focal infiltrate. There is no pleural effusion. The heart and pulmonary vascularity are normal. The mediastinum is normal in width. The bony thorax exhibits no acute abnormality. IMPRESSION: There is no active cardiopulmonary disease. Electronically Signed   By: David  Martinique M.D.   On: 05/30/2017 17:14   Ct Angio Chest Pe W And/or Wo Contrast  Result Date: 05/31/2017 CLINICAL DATA:  Chest pain. Shortness of breath. Intermediate probability. Positive D-dimer. EXAM: CT ANGIOGRAPHY CHEST WITH CONTRAST TECHNIQUE: Multidetector CT imaging of the chest was performed using the standard protocol during bolus administration of intravenous contrast. Multiplanar CT image reconstructions and MIPs were obtained to evaluate the vascular anatomy. CONTRAST:  54 cc Isovue 370 IV COMPARISON:  Chest radiographs earlier this day. Abdominal CT 08/07/2012 FINDINGS: Cardiovascular: There are no filling defects within the pulmonary arteries to suggest pulmonary embolus. Thoracic aorta is normal in caliber without dissection. No pericardial effusion. Mediastinum/Nodes: No mediastinal hilar adenopathy. The esophagus is decompressed, tiny hiatal hernia. Visualized thyroid gland is normal. Lungs/Pleura: Minimal linear atelectasis in the left lower lobe. Lungs are otherwise clear parent no confluent consolidation. No pulmonary edema. No pleural fluid. Upper Abdomen: Geographic area of low density in the anterior left lobe of the liver corresponds to hemangioma seen on prior abdominal CT. Smaller hemangiomas on prior CT are not well visualized due to phase of contrast. No acute abnormality. Musculoskeletal: There are no acute or suspicious osseous abnormalities. Review of the MIP images confirms the above findings. IMPRESSION: 1. No pulmonary embolus. 2. Streaky left lower lobe atelectasis.  Lungs are otherwise clear. 3. Small hiatal hernia. Electronically Signed   By: Jeb Levering  M.D.   On: 05/31/2017 00:06    Procedures Procedures (including critical care time)  Medications Ordered in ED Medications  aspirin chewable tablet 324 mg (324 mg Oral Given 05/30/17 2242)  iopamidol (ISOVUE-370) 76 % injection (100 mLs  Contrast Given 05/30/17 2335)  ibuprofen (ADVIL,MOTRIN) tablet 600 mg (600 mg Oral Given 05/31/17 0028)     Initial Impression / Assessment and Plan / ED Course  I have reviewed the triage vital signs and the nursing notes.  Pertinent labs & imaging results that were available during my care of the patient were reviewed by me and considered in my medical decision making (see chart for details).  51 year old female who presents with intermittent chest pain for the past day. On my evaluation she is now chest pain-free. She is nontoxic in no acute distress. Her vital signs are within normal limits.  She has no significant risk factors for ACS other than her age. Heart score of 2 given some features of pain being concerning and other being somewhat atypical. Her EKG is not ischemic, and serial EKG does not show dynamic changes. Serial troponins are negative. Is felt to be ruled out for ACS with overall low risk profile.  She has been complaining of pain down the left leg and leg cramping but no other major PE or DVT risk factors. D-dimer is elevated. She did undergo CT angiogram of the chest to rule out PE. There is no evidence of PE, dissection, or other acute intra-reticulocyte processes. We will send her for ultrasound of the left lower extremity tomorrow morning to rule out DVT.  At this time, we have ruled out emergent or serious processes. She is felt stable for discharge with outpatient follow-up for ongoing workup. She states that she does not have PCP for follow-up, and referral to cardiology is given as needed for potential outpatient stress test or echo if needed. Strict return and follow-up instructions reviewed. She expressed understanding of all  discharge instructions and felt comfortable with the plan of care.   Final Clinical Impressions(s) / ED Diagnoses   Final diagnoses:  Nonspecific chest pain    New Prescriptions New Prescriptions   No medications on file     Forde Dandy, MD 05/31/17 [phone]

## 2017-05-30 NOTE — ED Triage Notes (Signed)
Pt in c/o mid radiating CP into bil shoulder blades & bil jaw onset today at noon intermittent, pt c/o return of pain x 1 hr ago, c/o SOB with CP episodes, denies current CP, SOB, n/v/d, A&O x4

## 2017-05-30 NOTE — ED Triage Notes (Signed)
Pt reports episode of increased chest pain while sitting in waiting room. Repeat EKG done, pain is decreased now.

## 2017-05-30 NOTE — ED Notes (Signed)
Pt c/o lower back pain. EDP notified.

## 2017-05-31 ENCOUNTER — Ambulatory Visit (HOSPITAL_COMMUNITY): Admission: RE | Admit: 2017-05-31 | Payer: 59 | Source: Ambulatory Visit

## 2017-05-31 MED ORDER — IBUPROFEN 400 MG PO TABS
600.0000 mg | ORAL_TABLET | Freq: Once | ORAL | Status: AC
  Administered 2017-05-31: 600 mg via ORAL
  Filled 2017-05-31: qty 1

## 2017-05-31 NOTE — Discharge Instructions (Signed)
IMPORTANT PATIENT INSTRUCTIONS:  You have been scheduled for an Outpatient Vascular Study at [facility].   If tomorrow is a Saturday, Sunday or holiday, please go to the [facility] Emergency Department Registration Desk at 8 am tomorrow morning and tell them you are there for a vascular study.  If tomorrow is a weekday (Monday-Friday), please go to  [facility] at 8 am and tell them you are there for a vascular study.  Your work-up today otherwise has been reassuring. Your CT does not show blood clot or other serious processes. You heart work-up is reassuring. You have been given the contact number to arrange cardiology follow-up for further work-up of your symptoms.  Please return without fail for worsening symptoms, including recurrent/worsening pain, passing out, difficulty breathing, or any other symptoms concerning to you.

## 2017-05-31 NOTE — ED Notes (Signed)
Pt departed in NAD.  

## 2017-06-01 ENCOUNTER — Ambulatory Visit (HOSPITAL_COMMUNITY)
Admission: RE | Admit: 2017-06-01 | Discharge: 2017-06-01 | Disposition: A | Discharge status: Home / Self Care | Payer: 59 | Source: Ambulatory Visit | Attending: Emergency Medicine | Admitting: Emergency Medicine

## 2017-06-01 DIAGNOSIS — M7989 Other specified soft tissue disorders: Secondary | ICD-10-CM

## 2017-06-01 DIAGNOSIS — M79605 Pain in left leg: Secondary | ICD-10-CM | POA: Diagnosis not present

## 2017-06-01 NOTE — Progress Notes (Signed)
*  PRELIMINARY RESULTS* Vascular Ultrasound Left lower extremity venous duplex has been completed.  Preliminary findings: No evidence of deep vein thrombosis or baker's cyst in the left lower extremity.   Everrett Coombe 06/01/2017, 8:43 AM

## 2017-06-09 ENCOUNTER — Other Ambulatory Visit: Payer: Self-pay

## 2017-06-27 ENCOUNTER — Other Ambulatory Visit: Payer: Self-pay | Admitting: Student

## 2017-06-27 DIAGNOSIS — M5126 Other intervertebral disc displacement, lumbar region: Secondary | ICD-10-CM

## 2017-07-08 ENCOUNTER — Other Ambulatory Visit: Payer: Self-pay | Admitting: Student

## 2017-07-08 ENCOUNTER — Ambulatory Visit
Admission: RE | Admit: 2017-07-08 | Discharge: 2017-07-08 | Disposition: A | Discharge status: Home / Self Care | Payer: 59 | Source: Ambulatory Visit | Attending: Student | Admitting: Student

## 2017-07-08 DIAGNOSIS — M5126 Other intervertebral disc displacement, lumbar region: Secondary | ICD-10-CM

## 2017-07-08 MED ORDER — IOPAMIDOL (ISOVUE-M 200) INJECTION 41%
1.0000 mL | Freq: Once | INTRAMUSCULAR | Status: AC
  Administered 2017-07-08: 1 mL via EPIDURAL

## 2017-07-08 MED ORDER — METHYLPREDNISOLONE ACETATE 40 MG/ML INJ SUSP (RADIOLOG
120.0000 mg | Freq: Once | INTRAMUSCULAR | Status: AC
  Administered 2017-07-08: 120 mg via EPIDURAL

## 2017-07-08 NOTE — Discharge Instructions (Signed)

## 2017-07-11 ENCOUNTER — Other Ambulatory Visit: Payer: 59

## 2017-10-11 HISTORY — PX: LUMBAR PUNCTURE: SHX1985

## 2017-10-11 HISTORY — PX: BLOOD PATCH: SHX1245

## 2018-03-21 ENCOUNTER — Encounter: Payer: Self-pay | Admitting: Neurology

## 2018-06-05 ENCOUNTER — Encounter

## 2018-06-05 ENCOUNTER — Ambulatory Visit: Payer: 59 | Admitting: Neurology

## 2018-06-05 ENCOUNTER — Encounter: Payer: Self-pay | Admitting: Neurology

## 2018-06-05 VITALS — BP 110/78 | HR 92 | Ht 65.0 in | Wt 219.0 lb

## 2018-06-05 DIAGNOSIS — G9340 Encephalopathy, unspecified: Secondary | ICD-10-CM

## 2018-06-05 DIAGNOSIS — R410 Disorientation, unspecified: Secondary | ICD-10-CM

## 2018-06-05 DIAGNOSIS — R413 Other amnesia: Secondary | ICD-10-CM | POA: Diagnosis not present

## 2018-06-05 DIAGNOSIS — R270 Ataxia, unspecified: Secondary | ICD-10-CM

## 2018-06-05 DIAGNOSIS — G912 (Idiopathic) normal pressure hydrocephalus: Secondary | ICD-10-CM

## 2018-06-05 NOTE — Patient Instructions (Addendum)
Lumbar Puncture for testing for Normal Pressure Hydrocephalus FDG PET Scan for Frontotemporal Dementia vs Alzheimers Formal Neurocognitive testing for memory testing Then a DAT scan to evaluate for Lewy body dementia Extensive testing   Dementia Dementia is the loss of two or more brain functions, such as:  Memory.  Decision making.  Behavior.  Speaking.  Thinking.  Problem solving.  There are many types of dementia. The most common type is called progressive dementia. Progressive dementia gets worse with time and it is irreversible. An example of this type of dementia is Alzheimer disease. What are the causes? This condition may be caused by:  Nerve cell damage in the brain.  Genetic mutations.  Certain medicines.  Multiple small strokes.  An infection, such as chronic meningitis.  A metabolic problem, such as vitamin B12 deficiency or thyroid disease.  Pressure on the brain, such as from a tumor or blood clot.  What are the signs or symptoms? Symptoms of this condition include:  Sudden changes in mood.  Depression.  Problems with balance.  Changes in personality.  Poor short-term memory.  Agitation.  Delusions.  Hallucinations.  Having a hard time: ? Speaking thoughts. ? Finding words. ? Solving problems. ? Doing familiar tasks. ? Understanding familiar ideas.  How is this diagnosed? This condition is diagnosed with an assessment by your health care provider. During this assessment, your health care provider will talk with you and your family, friends, or caregivers about your symptoms. A thorough medical history will be taken, and you will have a physical exam and tests. Tests may include:  Lab tests, such as blood or urine tests.  Imaging tests, such as a CT scan, PET scan, or MRI.  A lumbar puncture. This test involves removing and testing a small amount of the fluid that surrounds the brain and spinal cord.  An electroencephalogram  (EEG). In this test, small metal discs are used to measure electrical activity in the brain.  Memory tests, cognitive tests, and neuropsychological tests. These tests evaluate brain function.  How is this treated? Treatment depends on the cause of the dementia. It may involve taking medicines that may help:  To control the dementia.  To slow down the disease.  To manage symptoms.  In some cases, treating the cause of the dementia can improve symptoms, reverse symptoms, or slow down how quickly the dementia gets worse. Your health care provider can help direct you to support groups, organizations, and other health care providers who can help with decisions about your care. Follow these instructions at home: Medicine  Take over-the-counter and prescription medicines only as told by your health care provider.  Avoid taking medicines that can affect thinking, such as pain or sleeping medicines. Lifestyle   Make healthy lifestyle choices: ? Be physically active as told by your health care provider. ? Do not use any tobacco products, such as cigarettes, chewing tobacco, and e-cigarettes. If you need help quitting, ask your health care provider. ? Eat a healthy diet. ? Practice stress-management techniques when you get stressed. ? Stay social.  Drink enough fluid to keep your urine clear or pale yellow.  Make sure to get quality sleep. These tips can help you to get a good night's rest: ? Avoid napping during the day. ? Keep your sleeping area dark and cool. ? Avoid exercising during the few hours before you go to bed. ? Avoid caffeine products in the evening. General instructions  Work with your health care provider to determine  what you need help with and what your safety needs are.  If you were given a bracelet that tracks your location, make sure to wear it.  Keep all follow-up visits as told by your health care provider. This is important. Contact a health care provider  if:  You have any new symptoms.  You have problems with choking or swallowing.  You have any symptoms of a different illness. Get help right away if:  You develop a fever.  You have new or worsening confusion.  You have new or worsening sleepiness.  You have a hard time staying awake.  You or your family members become concerned for your safety. This information is not intended to replace advice given to you by your health care provider. Make sure you discuss any questions you have with your health care provider. Document Released: 03/23/2001 Document Revised: 02/05/2016 Document Reviewed: 06/25/2015 Elsevier Interactive Patient Education  [employer].

## 2018-06-05 NOTE — Progress Notes (Signed)
[facility]    Provider:  Dr [name] Referring Provider: Wardell Honour, MD, Dr. Delfino Lovett Taylor's. Primary Care Physician: Dr. Ronelle Nigh, MD  CC: Short-term memory loss very dizzy balance is off cannot sleep good  HPI:  [name] is a 52 y.o. female here as requested by Dr. Tamala Taylor for short-term memory loss, insomnia she is on multiple medications including Wellbutrin, Dexilant, diazepam, dronabinol, promethazine, rizatriptan, temazepam, tizanidine.  She is here with her husband who provides much information. She has had headaches since the age of 89. She saw Dr. Earley Taylor and Dr. Santo Taylor her for migraines. She sees Dr. Sima Taylor currently.  She started having problems with Gait, and in 2017 she started having memory issues with short-term memory. She started having difficulty. She was tested with neuropsychiatric testing who suggested a functional or malingering/conversion disorder. Her gait is not improved. Her gait is bad one day but not bad another day. Vertigo is improved, se feels it is associated with migraines and is treated there for her migraines and vertigo. Memory is not worsening. More short-term memory.  She has trouble keeping up with the date and day, she woke her son up on a Saturday thinking it was a weekday. Husband provides information. She couldn't function at work, was having difficulty keeping track of money. Stress makes things worse. Socially she become much less social with personality changes. She is much less filtred, will say things she would never said.    Reviewed notes, labs and imaging from outside physicians, which showed: Reviewed referral notes from Dr. Herma Taylor.  Patient reports memory loss balance dizziness suggestive of normal pressure hydrocephalus, brain MRI shows mild dilation of the ventricles, mild brain atrophy, incidental finding of arachnoid cyst, she was sent here for evaluation by neurology.  Reviewed [facility] of the brain completed at [facility], report only, no evidence of intracranial mass lesion or acute infarction, incidental small moderate right middle frontal convexity arachnoid cyst, mildly enlarged lateral ventricles without dilation of the temporal horns or significant widening of sulci likely reflecting mild central greater than peripheral volume loss.  MRI brain in 2017 at [facility] read as normal.   Neuropsych testing: Copied from Notes from neurology and personally reviewed " [name] is a 52 y.o. female presenting in consultation for evaluation of hydrocephalus and whether that is contributing to her numerous neurologic problems, most notably her memory.   I did not perform cognitive testing today. In a patient who previously demonstrated invalidity of testing (notably, she performed below the cut off for the Ray 3x5), I think that performing a bedside neurobehavioral status exam could really be misleading. The degree of functional cognitive impairment she experiences on a day to day basis may not be reflective of a frank memory disorder. I have placed a referral for neuropsychological testing wherein my colleagues can perform more thorough testing but I would be able to, with special attention to measures of performance validity. My highest suspicion is for a functional cognitive disorder. I think her anxiety is likely playing a larger role than she seems to initially recognize. This was the conclusion of the neuropsychologist in 2018, who seemed to think that there were signs of marital discord that came out especially in the disclosure session. I would agree that certain interpersonal and environmental factors are likely playing a role. Her memory complaints are of severe and dense amnesia, though she is able to provide a detailed description of some events more than others. 20 years  ago father was diagnosed wit alzheimers and her mother now has alzheimers.   She does not have a magnetic or an apraxic gait, it looks more slow  and cautious, similar to what you might see a multifactorial gait disorder associated with dizziness, anxiety, and potentially other factors. All things considered, I think it is unlikely that normal pressure hydrocephalus is a good explanation for her symptoms. Even in well selected individuals the long-term benefit of shunting is the exception rather than the rule. The cognitive complaints and abnormal pressure hydrocephalus tend to be more frontal subcortical, with slowed processing, deficits in complex attention and executive dysfunction.  I spent some time today talking to her about the different possibilities, including the possibility that I do not find evidence of a frank neurocognitive disorder. She seems to have internally directed anxiety, gets easily frustrated, and scored incredibly high on the gad 7. It is possible that her internal mental state is so preoccupied with anxiety, worried about potential cognitive failures, and pain (which she is suffering from headaches) that she is unable to adequately engage her cognitive faculties on testing. This would lead to the day-to-day appearance of a cognitive disorder, but is more indicative of the etiology being anxiety, pain, poor sleep, etc. For this reason that I think it is necessary to obtain formal neuropsychological testing with performance validity measures. "    Review of Systems: Patient complains of symptoms per HPI as well as the following symptoms:   Fatigue, blurred vision, easy bruising, memory loss, confusion, weakness, slurred speech, dizziness, insomnia, snoring, migraine, anxiety, decreased energy, joint pain. Pertinent negatives and positives per HPI. All others negative.   Social History   Socioeconomic History  . Marital status: Married    Spouse name: Not on file  . Number of children: Not on file  . Years of education: Not on file  . Highest education level: Not on file  Occupational History  . Not on file  Social  Needs  . Financial resource strain: Not on file  . Food insecurity:    Worry: Not on file    Inability: Not on file  . Transportation needs:    Medical: Not on file    Non-medical: Not on file  Tobacco Use  . Smoking status: Never Smoker  . Smokeless tobacco: Never Used  Substance and Sexual Activity  . Alcohol use: No  . Drug use: No  . Sexual activity: Not on file  Lifestyle  . Physical activity:    Days per week: Not on file    Minutes per session: Not on file  . Stress: Not on file  Relationships  . Social connections:    Talks on phone: Not on file    Gets together: Not on file    Attends religious service: Not on file    Active member of club or organization: Not on file    Attends meetings of clubs or organizations: Not on file    Relationship status: Not on file  . Intimate partner violence:    Fear of current or ex partner: Not on file    Emotionally abused: Not on file    Physically abused: Not on file    Forced sexual activity: Not on file  Other Topics Concern  . Not on file  Social History Narrative  . Not on file    Family History  Problem Relation Age of Onset  . Diabetes Mother   . Hypertension Mother   . Thyroid disease  Mother   . Cancer Father   . Diabetes Father   . Hypertension Father   . Thyroid disease Sister   . Diabetes Brother   . Diabetes Maternal Grandmother   . Cancer Maternal Grandfather     Past Medical History:  Diagnosis Date  . Allergy   . Dizziness   . Gastroparesis    [facility]  . Migraine    [facility]  . PONV (postoperative nausea and vomiting)   . Weight decrease     Past Surgical History:  Procedure Laterality Date  . ABDOMINAL HYSTERECTOMY    . abdominalplasty     s/p weight loss  . brachiplasty     s/p weight loss   . CHOLECYSTECTOMY  09/04/2012   Procedure: LAPAROSCOPIC CHOLECYSTECTOMY;  Surgeon: Harl Bowie, MD;  Location: [facility];  Service: General;  Laterality: N/A;  . COLONOSCOPY   08/21/12   few scattered sigmoid diverticula: one sessile right colon polyp removed by cold biopsies x2:  patchy erythema in the left colon multiple random biopsies done:  normal therminal ileum  . COSMETIC SURGERY    . KNEE ARTHROSCOPY W/ ACL RECONSTRUCTION     in early 1990's  . TONSILLECTOMY      Current Outpatient Medications  Medication Sig Dispense Refill  . albuterol (PROVENTIL HFA;VENTOLIN HFA) 108 (90 BASE) MCG/ACT inhaler Inhale 2 puffs into the lungs every 4 (four) hours as needed for wheezing or shortness of breath (cough, shortness of breath or wheezing.). 1 Inhaler 1  . dexlansoprazole (DEXILANT) 60 MG capsule Take 60 mg by mouth daily.    Marland Kitchen escitalopram (LEXAPRO) 20 MG tablet Take 20 mg by mouth at bedtime.    . Ibuprofen-Diphenhydramine Cit (MOTRIN PM) 200-38 MG TABS Take 3 tablets by mouth at bedtime as needed (pain).    Vladimir Faster Glycol-Propyl Glycol (SYSTANE OP) Place 1 drop into both eyes 2 (two) times daily as needed (dry eyes).    . promethazine (PHENERGAN) 25 MG tablet Take 25 mg by mouth daily as needed for nausea or vomiting.     . rizatriptan (MAXALT) 10 MG tablet Take 10 mg by mouth See admin instructions. Take 1 tablet (10 mg) by mouth as needed for migraine headache, May repeat in 2 hours if needed    . silver sulfADIAZINE (SILVADENE) 1 % cream Apply 1 application topically at bedtime as needed (wound care/leg).     Current Facility-Administered Medications  Medication Dose Route Frequency Provider Last Rate Last Dose  . albuterol (PROVENTIL) (2.5 MG/3ML) 0.083% nebulizer solution 2.5 mg  2.5 mg Nebulization Once Darlyne Russian, MD        Allergies as of 06/05/2018 - Review Complete 06/05/2018  Allergen Reaction Noted  . Codeine Nausea And Vomiting 07/02/2012  . Ondansetron Palpitations 12/11/2013  . Oxycodone-acetaminophen Nausea And Vomiting 12/13/2012  . Penicillins Diarrhea 12/13/2012  . Erythromycin Rash 02/20/2013    Vitals: BP 110/78   Pulse  92   Ht 5\' 5"  (1.651 m)   Wt 219 lb (99.3 kg)   BMI 36.44 kg/m  Last Weight:  Wt Readings from Last 1 Encounters:  06/05/18 219 lb (99.3 kg)   Last Height:   Ht Readings from Last 1 Encounters:  06/05/18 5\' 5"  (1.651 m)   Physical exam: Exam: Gen: NAD, conversant, tangential                  CV: RRR, no MRG. No Carotid Bruits. No peripheral edema, warm, nontender Eyes:  Conjunctivae clear without exudates or hemorrhage  Neuro: Detailed Neurologic Exam  Speech:    Speech is normal; fluent and spontaneous with normal comprehension.  Cognition:  MMSE - Mini Mental State Exam 06/05/2018  Orientation to time 5  Orientation to Place 5  Registration 3  Attention/ Calculation 3  Recall 2  Language- name 2 objects 2  Language- repeat 1  Language- follow 3 step command 3  Language- read & follow direction 1  Write a sentence 1  Copy design 0  Total score 26       The patient is oriented to person, place, and time;     recent and remote memory intact;     language fluent;     normal attention, concentration,     fund of knowledge Cranial Nerves:    The pupils are equal, round, and reactive to light. Attempted fundoscopic exam could not visualize. Visual fields are full to finger confrontation. Extraocular movements are intact. Trigeminal sensation is intact and the muscles of mastication are normal. The face is symmetric. The palate elevates in the midline. Hearing intact. Voice is normal. Shoulder shrug is normal. The tongue has normal motion without fasciculations.   Coordination:    No  Gait:    Bradykinetic, decreased arm swing on left, (which improved when patient was not being observed, when point ed out she says her gait does change)  Motor Observation:    No asymmetry, no atrophy, and no involuntary movements noted. Tone:    Normal muscle tone.    Posture:    Posture is normal. normal erect    Strength: prox LE weakness 4+/5 otherwise strength is V/V in the  upper and lower limbs.      Sensation: intact to LT     Reflex Exam:  DTR's:    Deep tendon reflexes in the upper and lower extremities are normal bilaterally.   Toes:    The toes are downgoing bilaterally.   Clonus:    Clonus is absent.   Assessment/Plan:  52 y.o. female here as requested by Dr. Tamala Taylor for short-term memory loss, insomnia, polypharmacy, migraines, vertigo here for memory and gait symptoms. She is tangential on exam. She has variable gait exam. MMSE 26/30.  - Neuropsychologist at [facility] declined testing her due to suspicion of Functional disorder.  - MRI of the brain report (I do not have the images, requesting CD) stated "mildly enlarged lateral ventricles without dilation of the temporal horns or significant widening of sulci likely reflecting mild central greater than peripheral volume loss". MRI 2017 report states normal. Dr. Rondel Oh at [facility] documented that he reviewed both MRIs and they are both normal and stable.  - Dr. Nelva Bush is concerned for NPH given (questionable?) findings on MRI. I think that is very unlikely, but due to patient's reported memory loss, personality changes, gait difficulties and Dr. Nelva Bush' concern will perform Lumbar Puncture for testing for Normal Pressure Hydrocephalus  - I think a degenerative neurocognitive disorder is very unlikely but given patient's ongoing chronic symptoms she needs an FDG PET Scan for Frontotemporal Dementia vs Alzheimers(she has symptoms of both, for example personality changes(more FTD early) vs short-term memory loss(more Alz early)  - Lab testing: see below  - Memory problems are Likely multifactorial: she is multiple medications that can cause memory loss(polypharmacy), normal cognitive aging, stress/anxiety, lifestyle, conversion disorder or functional disorder, anxiety. But as above she needs a thorough evaluation. Discuss with prescribing physicians to stop marinol and gabapentin  and any medications  that can cause sedation.  - Will send to Dr. Si Raider for Formal Neurocognitive testing  - Then a DAT scan to evaluate for Lewy body dementia pending results of above given memory changes and gait reported problems  Orders Placed This Encounter  Procedures  . NM PET Metabolic Brain  . DG FLUORO GUIDED LOC OF NEEDLE/CATH TIP FOR SPINAL INJECT LT  . B12 and Folate Panel  . Methylmalonic acid, serum  . RPR  . TSH  . Sedimentation rate  . Ammonia  . Vitamin B1  . ANA w/Reflex  . Pan-ANCA  . Sjogren's syndrome antibods(ssa + ssb)  . Hepatitis C antibody  . Tissue transglutaminase, IgA  . Gliadin antibodies, serum  . Rheumatoid factor  . Heavy metals, blood  . Vitamin B6  . B. burgdorfi Antibody  . Comprehensive metabolic panel  . CBC  . HIV antibody (with reflex)  . Ambulatory referral to Neuropsychology   Cc: DR. Nelva Bush, Dr. Lester [place], [facility] [address] [place], [place] [address]  Phone [phone] Fax [phone]

## 2018-06-08 LAB — ANA W/REFLEX: ANA: NEGATIVE

## 2018-06-08 LAB — COMPREHENSIVE METABOLIC PANEL
A/G RATIO: 2.1 (ref 1.2–2.2)
ALBUMIN: 4.4 g/dL (ref 3.5–5.5)
ALT: 22 IU/L (ref 0–32)
AST: 17 IU/L (ref 0–40)
Alkaline Phosphatase: 145 IU/L — ABNORMAL HIGH (ref 39–117)
BUN / CREAT RATIO: 16 (ref 9–23)
BUN: 12 mg/dL (ref 6–24)
Bilirubin Total: 0.2 mg/dL (ref 0.0–1.2)
CALCIUM: 9.4 mg/dL (ref 8.7–10.2)
CO2: 24 mmol/L (ref 20–29)
CREATININE: 0.73 mg/dL (ref 0.57–1.00)
Chloride: 101 mmol/L (ref 96–106)
GFR, EST AFRICAN AMERICAN: 110 mL/min/{1.73_m2} (ref >59)
GFR, EST NON AFRICAN AMERICAN: 95 mL/min/{1.73_m2} (ref >59)
GLOBULIN, TOTAL: 2.1 g/dL (ref 1.5–4.5)
Glucose: 95 mg/dL (ref 65–99)
POTASSIUM: 4.4 mmol/L (ref 3.5–5.2)
SODIUM: 142 mmol/L (ref 134–144)
Total Protein: 6.5 g/dL (ref 6.0–8.5)

## 2018-06-08 LAB — PAN-ANCA
ANCA Proteinase 3: <3.5 U/mL (ref 0.0–3.5)
C-ANCA: <1:20 {titer}

## 2018-06-08 LAB — TSH: TSH: 4.1 u[IU]/mL (ref 0.450–4.500)

## 2018-06-08 LAB — SEDIMENTATION RATE: SED RATE: 29 mm/h (ref 0–40)

## 2018-06-08 LAB — SJOGREN'S SYNDROME ANTIBODS(SSA + SSB)
ENA SSA (RO) Ab: <0.2 AI (ref 0.0–0.9)
ENA SSB (LA) Ab: <0.2 AI (ref 0.0–0.9)

## 2018-06-08 LAB — HEPATITIS C ANTIBODY

## 2018-06-08 LAB — CBC
Hematocrit: 41.5 % (ref 34.0–46.6)
Hemoglobin: 13.9 g/dL (ref 11.1–15.9)
MCH: 32.3 pg (ref 26.6–33.0)
MCHC: 33.5 g/dL (ref 31.5–35.7)
MCV: 96 fL (ref 79–97)
PLATELETS: 269 10*3/uL (ref 150–450)
RBC: 4.31 x10E6/uL (ref 3.77–5.28)
RDW: 12 % — AB (ref 12.3–15.4)
WBC: 7.2 10*3/uL (ref 3.4–10.8)

## 2018-06-08 LAB — RPR: RPR Ser Ql: NONREACTIVE

## 2018-06-08 LAB — GLIADIN ANTIBODIES, SERUM
Antigliadin Abs, IgA: 3 units (ref 0–19)
GLIADIN IGG: 2 U (ref 0–19)

## 2018-06-08 LAB — RHEUMATOID FACTOR

## 2018-06-08 LAB — HEAVY METALS, BLOOD
Arsenic: 5 ug/L (ref 2–23)
Lead, Blood: NOT DETECTED ug/dL (ref 0–4)
Mercury: NOT DETECTED ug/L (ref 0.0–14.9)

## 2018-06-08 LAB — VITAMIN B1: Thiamine: 106.5 nmol/L (ref 66.5–200.0)

## 2018-06-08 LAB — HIV ANTIBODY (ROUTINE TESTING W REFLEX): HIV Screen 4th Generation wRfx: NONREACTIVE

## 2018-06-08 LAB — METHYLMALONIC ACID, SERUM: METHYLMALONIC ACID: 74 nmol/L (ref 0–378)

## 2018-06-08 LAB — TISSUE TRANSGLUTAMINASE, IGA

## 2018-06-08 LAB — B12 AND FOLATE PANEL
Folate: >20 ng/mL (ref >3.0)
VITAMIN B 12: 486 pg/mL (ref 232–1245)

## 2018-06-08 LAB — B. BURGDORFI ANTIBODIES: Lyme IgG/IgM Ab: <0.91 {ISR} (ref 0.00–0.90)

## 2018-06-08 LAB — AMMONIA: Ammonia: 45 ug/dL (ref 19–87)

## 2018-06-08 LAB — VITAMIN B6: Vitamin B6: 11.5 ug/L (ref 2.0–32.8)

## 2018-06-12 ENCOUNTER — Encounter: Payer: Self-pay | Admitting: Neurology

## 2018-06-12 DIAGNOSIS — R413 Other amnesia: Secondary | ICD-10-CM | POA: Insufficient documentation

## 2018-06-28 ENCOUNTER — Telehealth: Payer: Self-pay | Admitting: Neurology

## 2018-06-28 NOTE — Telephone Encounter (Addendum)
FDG Pets can declined by insurance, please let patient know the best thing we can do is get her to formal memory testing thanks  . Dr. Jaynee Eagles.  Called Patient left her a message asking her to call me back so I can talk to her about PET  scan. Thanks Hinton Dyer .      Spoke to Patient and she refused her apt with Dr. Tawny Asal . I talked to patient and she has changed her mind she has decided   to see  Dr.Bailiar  . I have called there office and they will call patient to schedule.  LP -- Angelita Ingles has called patient to schedule as well patient didn't remember I will ask Angelita Ingles to call patient back.

## 2018-07-11 ENCOUNTER — Telehealth: Payer: Self-pay | Admitting: Neurology

## 2018-07-11 ENCOUNTER — Encounter: Payer: Self-pay | Admitting: Psychology

## 2018-07-11 ENCOUNTER — Other Ambulatory Visit: Payer: Self-pay | Admitting: Neurology

## 2018-07-11 MED ORDER — ALPRAZOLAM 0.25 MG PO TABS: ORAL_TABLET | ORAL | 0 refills | Status: DC

## 2018-07-11 NOTE — Telephone Encounter (Signed)
Patients husband Shanon Brow (on [place]) states patient has lumbar puncture this Friday at [facility]. Patient needs something to help her relax. Patient uses [employer].

## 2018-07-11 NOTE — Telephone Encounter (Signed)
I ordered it and sent patient an email to let her know. No need to call her about it thanks

## 2018-07-12 NOTE — Telephone Encounter (Signed)
Noted thank you

## 2018-07-14 ENCOUNTER — Ambulatory Visit
Admission: RE | Admit: 2018-07-14 | Discharge: 2018-07-14 | Disposition: A | Discharge status: Home / Self Care | Payer: 59 | Source: Ambulatory Visit | Attending: Neurology | Admitting: Neurology

## 2018-07-14 VITALS — BP 127/66 | HR 91

## 2018-07-14 DIAGNOSIS — G912 (Idiopathic) normal pressure hydrocephalus: Secondary | ICD-10-CM

## 2018-07-14 LAB — CSF CELL COUNT WITH DIFFERENTIAL
RBC Count, CSF: 1 cells/uL (ref 0–10)
WBC CSF: 1 {cells}/uL (ref 0–5)

## 2018-07-14 LAB — PROTEIN, CSF: Total Protein, CSF: 43 mg/dL (ref 15–45)

## 2018-07-14 LAB — GLUCOSE, CSF: GLUCOSE CSF: 59 mg/dL (ref 40–80)

## 2018-07-14 NOTE — Discharge Instructions (Signed)

## 2018-07-18 ENCOUNTER — Telehealth: Payer: Self-pay | Admitting: *Deleted

## 2018-07-18 NOTE — Telephone Encounter (Signed)
I referred her to Dr. Si Raider for formal neurocognitive testing, we will have to wait for that at this point. If she declines quickly she needs to go to the emergency room we can't get testing completed that quickly in outpatient.

## 2018-07-18 NOTE — Telephone Encounter (Addendum)
Called and spoke with pt's husband [name] (on [place]). Informed him that opening pressure was normal on the LP. He verbalized understanding and stated he had already seen the results and was aware that they drained to a closing pressure of 10. He stated pt remains confused. She has canceled appointments (he should be primary contact). He has to leave reminders for her. He asked what is next since PET scan was denied. He wanted to know if there was anything we could do to get the scan. RN advised that she would check with Dr. Jaynee Eagles and call him back. He verbalized appreciation.    ----- Message from Melvenia Beam, MD sent at 07/17/2018  4:31 PM EDT ----- Opening pressure normal thanks

## 2018-07-19 ENCOUNTER — Other Ambulatory Visit: Payer: Self-pay | Admitting: *Deleted

## 2018-07-19 ENCOUNTER — Other Ambulatory Visit: Payer: Self-pay | Admitting: Neurology

## 2018-07-19 DIAGNOSIS — G971 Other reaction to spinal and lumbar puncture: Secondary | ICD-10-CM

## 2018-07-19 MED ORDER — ALPRAZOLAM 0.25 MG PO TABS: ORAL_TABLET | ORAL | 0 refills | Status: DC

## 2018-07-19 NOTE — Addendum Note (Signed)
Addended by: Gildardo Griffes on: 07/19/2018 04:23 PM   Modules accepted: Orders

## 2018-07-19 NOTE — Telephone Encounter (Signed)
[name] returned my call. Discussed that Dr. Jaynee Eagles states that the next step is the formal neurocognitive testing with Dr. Richrd Sox and we will need to wait for those results. Also discussed that if the patient declines quickly she needs to go to the emergency department for evaluation. They would be able to scan quickly vs in the outpatient setting. He verbalized understanding. He stated that the patient still hasn't been able to shake her spinal headache. She has been lying down since Friday. She has a headache and vertigo when she sits up. RN stated that she would d/w Dr. Jaynee Eagles to see if further action is needed. David verbalized appreciation.

## 2018-07-19 NOTE — Telephone Encounter (Signed)
Attempted to call Shanon Brow, pt's husband, back. Received no answer. VM box full, unable to leave message.

## 2018-07-19 NOTE — Telephone Encounter (Signed)
Conversing with pt via mychart. Will try to call husband again.

## 2018-07-19 NOTE — Telephone Encounter (Signed)
[name], no answer and vm box was full. Unable to reach him. Will inform him that we are ordering a blood patch per Dr. Jaynee Eagles for pt's spinal headache. Will try to call him again today.

## 2018-07-19 NOTE — Telephone Encounter (Signed)
That's fine order a blood patch thanks

## 2018-07-19 NOTE — Progress Notes (Signed)
Blood patch. S/p lumbar puncture.

## 2018-07-19 NOTE — Telephone Encounter (Signed)
Called and spoke with patient and husband. Informed them that the blood patch was ordered and GI will be calling them to setup an appointment. Also informed them that I am going to ask Dr. Jaynee Eagles about the Xanax. Pt is concerned that she will jerk during the procedure as reflex from her being nervous. They verbalized appreciation. Shanon Brow will be driving her.

## 2018-07-20 ENCOUNTER — Ambulatory Visit
Admission: RE | Admit: 2018-07-20 | Discharge: 2018-07-20 | Disposition: A | Discharge status: Home / Self Care | Payer: 59 | Source: Ambulatory Visit | Attending: Neurology | Admitting: Neurology

## 2018-07-20 DIAGNOSIS — G971 Other reaction to spinal and lumbar puncture: Secondary | ICD-10-CM

## 2018-07-20 MED ORDER — IOPAMIDOL (ISOVUE-M 200) INJECTION 41%
1.0000 mL | Freq: Once | INTRAMUSCULAR | Status: AC
  Administered 2018-07-20: 1 mL via EPIDURAL

## 2018-07-20 NOTE — Discharge Instructions (Signed)

## 2018-07-20 NOTE — Progress Notes (Signed)
20cc blood drawn from left AC space for epidural blood patch; site unremarkable.  Gypsy Lore, RN

## 2018-08-15 ENCOUNTER — Encounter: Payer: Self-pay | Admitting: Neurology

## 2018-08-15 ENCOUNTER — Encounter

## 2018-08-15 ENCOUNTER — Ambulatory Visit (INDEPENDENT_AMBULATORY_CARE_PROVIDER_SITE_OTHER): Payer: 59 | Admitting: Neurology

## 2018-08-15 VITALS — BP 122/84 | HR 88 | Ht 65.0 in | Wt 227.0 lb

## 2018-08-15 DIAGNOSIS — R413 Other amnesia: Secondary | ICD-10-CM

## 2018-08-15 DIAGNOSIS — F09 Unspecified mental disorder due to known physiological condition: Secondary | ICD-10-CM | POA: Diagnosis not present

## 2018-08-15 NOTE — Progress Notes (Signed)
[facility]    Provider:  Dr Jaynee Eagles Referring Provider: Dr. Delfino Lovett Ramo's. Primary Care Physician: Dr. Ronelle Nigh, MD  CC: Short-term memory loss  Interval history: Patient with unexplained memory loss and neuropsychiatric disorders. [facility] memory unit refused to perfor memory testing on her bc they thought it was a functional disorder. MRI brain normal. Extensive lab testing normal. FDG PET SCan was denied. Pending Neuropsych testing. She continues to feel like something is not right. She can't even count money. She freezes and can;t count out cash. She can't remember what she did 2 weeks ago or where she out anything.  [facility], called there for her, everyone else is booked out 6 months.   HPI:  [name] is a 52 y.o. female here as requested by Dr. No ref. provider found for short-term memory loss, insomnia she is on multiple medications including Wellbutrin, Dexilant, diazepam, dronabinol, promethazine, rizatriptan, temazepam, tizanidine.  She is here with her husband who provides much information. She has had headaches since the age of 71. She saw Dr. Earley Favor and Dr. Santo Held her for migraines. She sees Dr. Sima Matas currently.  She started having problems with Gait, and in 2017 she started having memory issues with short-term memory. She started having difficulty. She was tested with neuropsychiatric testing who suggested a functional or malingering/conversion disorder. Her gait is not improved. Her gait is bad one day but not bad another day. Vertigo is improved, se feels it is associated with migraines and is treated there for her migraines and vertigo. Memory is not worsening. More short-term memory.  She has trouble keeping up with the date and day, she woke her son up on a Saturday thinking it was a weekday. Husband provides information. She couldn't function at work, was having difficulty keeping track of money. Stress makes things  worse. Socially she become much less social with personality changes. She is much less filtred, will say things she would never said.    Reviewed notes, labs and imaging from outside physicians, which showed: Reviewed referral notes from Dr. Herma Mering.  Patient reports memory loss balance dizziness suggestive of normal pressure hydrocephalus, brain MRI shows mild dilation of the ventricles, mild brain atrophy, incidental finding of arachnoid cyst, she was sent here for evaluation by neurology.  Reviewed MRI of the brain completed at [facility], report only, no evidence of intracranial mass lesion or acute infarction, incidental small moderate right middle frontal convexity arachnoid cyst, mildly enlarged lateral ventricles without dilation of the temporal horns or significant widening of sulci likely reflecting mild central greater than peripheral volume loss.  MRI brain in 2017 at [facility] read as normal.   Neuropsych testing: Copied from Notes from neurology and personally reviewed " [name] is a 52 y.o. female presenting in consultation for evaluation of hydrocephalus and whether that is contributing to her numerous neurologic problems, most notably her memory.   I did not perform cognitive testing today. In a patient who previously demonstrated invalidity of testing (notably, she performed below the cut off for the Ray 3x5), I think that performing a bedside neurobehavioral status exam could really be misleading. The degree of functional cognitive impairment she experiences on a day to day basis may not be reflective of a frank memory disorder. I have placed a referral for neuropsychological testing wherein my colleagues can perform more thorough testing but I would be able to, with special attention to measures of performance validity. My  highest suspicion is for a functional cognitive disorder. I think her anxiety is likely playing a larger role than she seems to initially recognize. This was the  conclusion of the neuropsychologist in 2018, who seemed to think that there were signs of marital discord that came out especially in the disclosure session. I would agree that certain interpersonal and environmental factors are likely playing a role. Her memory complaints are of severe and dense amnesia, though she is able to provide a detailed description of some events more than others. 53 years ago father was diagnosed wit alzheimers and her mother now has alzheimers.   She does not have a magnetic or an apraxic gait, it looks more slow and cautious, similar to what you might see a multifactorial gait disorder associated with dizziness, anxiety, and potentially other factors. All things considered, I think it is unlikely that normal pressure hydrocephalus is a good explanation for her symptoms. Even in well selected individuals the long-term benefit of shunting is the exception rather than the rule. The cognitive complaints and abnormal pressure hydrocephalus tend to be more frontal subcortical, with slowed processing, deficits in complex attention and executive dysfunction.  I spent some time today talking to her about the different possibilities, including the possibility that I do not find evidence of a frank neurocognitive disorder. She seems to have internally directed anxiety, gets easily frustrated, and scored incredibly high on the gad 7. It is possible that her internal mental state is so preoccupied with anxiety, worried about potential cognitive failures, and pain (which she is suffering from headaches) that she is unable to adequately engage her cognitive faculties on testing. This would lead to the day-to-day appearance of a cognitive disorder, but is more indicative of the etiology being anxiety, pain, poor sleep, etc. For this reason that I think it is necessary to obtain formal neuropsychological testing with performance validity measures. "    Review of Systems: Patient complains of  symptoms per HPI as well as the following symptoms:   Fatigue, blurred vision, easy bruising, memory loss, confusion, weakness, slurred speech, dizziness, insomnia, snoring, migraine, anxiety, decreased energy, joint pain. Pertinent negatives and positives per HPI. All others negative.   Social History   Socioeconomic History  . Marital status: Married    Spouse name: Not on file  . Number of children: 2  . Years of education: 73  . Highest education level: Some college, no degree  Occupational History  . Not on file  Social Needs  . Financial resource strain: Not on file  . Food insecurity:    Worry: Not on file    Inability: Not on file  . Transportation needs:    Medical: Not on file    Non-medical: Not on file  Tobacco Use  . Smoking status: Never Smoker  . Smokeless tobacco: Never Used  Substance and Sexual Activity  . Alcohol use: No  . Drug use: No  . Sexual activity: Not on file  Lifestyle  . Physical activity:    Days per week: Not on file    Minutes per session: Not on file  . Stress: Not on file  Relationships  . Social connections:    Talks on phone: Not on file    Gets together: Not on file    Attends religious service: Not on file    Active member of club or organization: Not on file    Attends meetings of clubs or organizations: Not on file    Relationship  status: Not on file  . Intimate partner violence:    Fear of current or ex partner: Not on file    Emotionally abused: Not on file    Physically abused: Not on file    Forced sexual activity: Not on file  Other Topics Concern  . Not on file  Social History Narrative   Lives at home with husband and children    Family History  Problem Relation Age of Onset  . Diabetes Mother   . Hypertension Mother   . Thyroid disease Mother   . Cancer Father   . Diabetes Father   . Hypertension Father   . Thyroid disease Sister   . Diabetes Brother   . Diabetes Maternal Grandmother   . Cancer Maternal  Grandfather     Past Medical History:  Diagnosis Date  . Allergy   . Dizziness   . Gastroparesis    [facility]  . Migraine    [facility]  . PONV (postoperative nausea and vomiting)   . Weight decrease     Past Surgical History:  Procedure Laterality Date  . ABDOMINAL HYSTERECTOMY    . abdominalplasty     s/p weight loss  . BLOOD PATCH  2019  . brachiplasty     s/p weight loss   . CHOLECYSTECTOMY  09/04/2012   Procedure: LAPAROSCOPIC CHOLECYSTECTOMY;  Surgeon: Harl Bowie, MD;  Location: [facility];  Service: General;  Laterality: N/A;  . COLONOSCOPY  08/21/12   few scattered sigmoid diverticula: one sessile right colon polyp removed by cold biopsies x2:  patchy erythema in the left colon multiple random biopsies done:  normal therminal ileum  . COSMETIC SURGERY    . KNEE ARTHROSCOPY W/ ACL RECONSTRUCTION     in early 1990's  . LUMBAR PUNCTURE  2019  . TONSILLECTOMY      Current Outpatient Medications  Medication Sig Dispense Refill  . dexlansoprazole (DEXILANT) 60 MG capsule Take 60 mg by mouth daily.    . ondansetron (ZOFRAN-ODT) 8 MG disintegrating tablet Take 1 tablet by mouth as needed.    Vladimir Faster Glycol-Propyl Glycol (SYSTANE OP) Place 1 drop into both eyes 2 (two) times daily as needed (dry eyes).    . promethazine (PHENERGAN) 25 MG tablet Take 25 mg by mouth daily as needed for nausea or vomiting.     . rizatriptan (MAXALT) 10 MG tablet Take 10 mg by mouth See admin instructions. Take 1 tablet (10 mg) by mouth as needed for migraine headache, May repeat in 2 hours if needed    . albuterol (PROVENTIL HFA;VENTOLIN HFA) 108 (90 BASE) MCG/ACT inhaler Inhale 2 puffs into the lungs every 4 (four) hours as needed for wheezing or shortness of breath (cough, shortness of breath or wheezing.). (Patient not taking: Reported on 08/15/2018) 1 Inhaler 1  . buPROPion (WELLBUTRIN XL) 150 MG 24 hr tablet Take 450 mg by mouth daily.    . diazepam (VALIUM) 10 MG  tablet Take 10 mg by mouth as needed for anxiety.    . Ibuprofen-Diphenhydramine Cit (MOTRIN PM) 200-38 MG TABS Take 3 tablets by mouth at bedtime as needed (pain).     Current Facility-Administered Medications  Medication Dose Route Frequency Provider Last Rate Last Dose  . albuterol (PROVENTIL) (2.5 MG/3ML) 0.083% nebulizer solution 2.5 mg  2.5 mg Nebulization Once Darlyne Russian, MD        Allergies as of 08/15/2018 - Review Complete 08/15/2018  Allergen Reaction Noted  . Codeine  Nausea And Vomiting 07/02/2012  . Ondansetron Palpitations 12/11/2013  . Oxycodone-acetaminophen Nausea And Vomiting 12/13/2012  . Penicillins Diarrhea 12/13/2012  . Erythromycin Rash 02/20/2013    Vitals: BP 122/84 (BP Location: Right Arm, Patient Position: Sitting)   Pulse 88   Ht 5\' 5"  (1.651 m)   Wt 227 lb (103 kg)   BMI 37.77 kg/m  Last Weight:  Wt Readings from Last 1 Encounters:  08/15/18 227 lb (103 kg)   Last Height:   Ht Readings from Last 1 Encounters:  08/15/18 5\' 5"  (1.651 m)   Physical exam: Exam: Gen: NAD, conversant, tangential                  CV: RRR, no MRG. No Carotid Bruits. No peripheral edema, warm, nontender Eyes: Conjunctivae clear without exudates or hemorrhage  Neuro: Detailed Neurologic Exam  Speech:    Speech is normal; fluent and spontaneous with normal comprehension.  Cognition:  MMSE - Mini Mental State Exam 06/05/2018  Orientation to time 5  Orientation to Place 5  Registration 3  Attention/ Calculation 3  Recall 2  Language- name 2 objects 2  Language- repeat 1  Language- follow 3 step command 3  Language- read & follow direction 1  Write a sentence 1  Copy design 0  Total score 26       The patient is oriented to person, place, and time;     recent and remote memory intact;     language fluent;     normal attention, concentration,     fund of knowledge Cranial Nerves:    The pupils are equal, round, and reactive to light. Attempted  fundoscopic exam could not visualize. Visual fields are full to finger confrontation. Extraocular movements are intact. Trigeminal sensation is intact and the muscles of mastication are normal. The face is symmetric. The palate elevates in the midline. Hearing intact. Voice is normal. Shoulder shrug is normal. The tongue has normal motion without fasciculations.   Coordination:    No  Gait:    Bradykinetic, decreased arm swing on left, (which improved when patient was not being observed, when point ed out she says her gait does change)  Motor Observation:    No asymmetry, no atrophy, and no involuntary movements noted. Tone:    Normal muscle tone.    Posture:    Posture is normal. normal erect    Strength: prox LE weakness 4+/5 otherwise strength is V/V in the upper and lower limbs.      Sensation: intact to LT     Reflex Exam:  DTR's:    Deep tendon reflexes in the upper and lower extremities are normal bilaterally.   Toes:    The toes are downgoing bilaterally.   Clonus:    Clonus is absent.   Assessment/Plan:  52 y.o. female here as requested by Dr. Tamala Julian for short-term memory loss, insomnia, polypharmacy, migraines, vertigo here for memory and gait symptoms. She is tangential on exam. She has variable gait exam. MMSE 26/30.  - Neuropsychologist at [facility] declined testing her due to suspicion of Functional disorder.  - MRI of the brain report (I do not have the images, requesting CD) stated "mildly enlarged lateral ventricles without dilation of the temporal horns or significant widening of sulci likely reflecting mild central greater than peripheral volume loss". MRI 2017 report states normal. Dr. Rondel Oh at [facility] documented that he reviewed both MRIs and they are both normal and stable.  - Dr.  Ramos is concerned for NPH given (questionable?) findings on MRI. I think that is very unlikely, but due to patient's reported memory loss, personality changes, gait  difficulties and Dr. Nelva Bush' concern will perform Lumbar Puncture for testing for Normal Pressure Hydrocephalus  - I think a degenerative neurocognitive disorder is very unlikely but given patient's ongoing chronic symptoms she needs an FDG PET Scan for Frontotemporal Dementia vs Alzheimers(she has symptoms of both, for example personality changes(more FTD early) vs short-term memory loss(more Alz early)  - Lab testing: all normal: Csf glucose, protein, , cell count wit diff HIV, CBC, cmp, lyme, b6, heavy metals. RA, gliadin. Transgut, hep c, sjogrens, pan-anca, ana w/reflex, vit b1, ammonia, sed rate, rpr, mma, b12, folate  Mri brain w/wo normal  - Memory problems are Likely multifactorial: she is multiple medications that can cause memory loss(polypharmacy), normal cognitive aging, stress/anxiety, lifestyle, conversion disorder or functional disorder, anxiety. But as above she needs a thorough evaluation. Discuss with prescribing physicians to stop marinol and gabapentin and any medications that can cause sedation.  - Will send to Dr. Si Raider for Formal Neurocognitive testing  - Then a DAT scan to evaluate for Lewy body dementia pending results of above given memory changes and gait reported problems  Orders Placed This Encounter  Procedures  . Ambulatory referral to Neuropsychology   Cc: DR. Nelva Bush, Dr. Tamala Julian  A total of 25 minutes was spent face-to-face with this patient. Over half this time was spent on counseling patient on the  1. Memory loss   2. Cognitive disorder    diagnosis and different diagnostic and therapeutic options, counseling and coordination of care, risks ans benefits of management, compliance, or risk factor reduction and education.     Sarina Ill, MD  [facility] [address] [place],  [address]  Phone [phone] Fax [phone]

## 2018-08-16 ENCOUNTER — Telehealth: Payer: Self-pay | Admitting: Neurology

## 2018-08-16 NOTE — Telephone Encounter (Signed)
Referral has been sent to [facility] in [place] [phone]- fax [phone]. Patient and her husband are aware of all details .

## 2018-09-25 ENCOUNTER — Encounter: Payer: 59 | Attending: Psychology | Admitting: Psychology

## 2018-09-25 ENCOUNTER — Encounter

## 2018-09-25 DIAGNOSIS — G43011 Migraine without aura, intractable, with status migrainosus: Secondary | ICD-10-CM | POA: Insufficient documentation

## 2018-09-25 DIAGNOSIS — F09 Unspecified mental disorder due to known physiological condition: Secondary | ICD-10-CM | POA: Diagnosis not present

## 2018-09-25 DIAGNOSIS — R413 Other amnesia: Secondary | ICD-10-CM

## 2018-09-25 DIAGNOSIS — G4709 Other insomnia: Secondary | ICD-10-CM | POA: Insufficient documentation

## 2018-09-25 DIAGNOSIS — G4700 Insomnia, unspecified: Secondary | ICD-10-CM

## 2018-09-25 DIAGNOSIS — G43709 Chronic migraine without aura, not intractable, without status migrainosus: Secondary | ICD-10-CM | POA: Diagnosis not present

## 2018-09-25 DIAGNOSIS — IMO0002 Reserved for concepts with insufficient information to code with codable children: Secondary | ICD-10-CM

## 2018-10-01 ENCOUNTER — Encounter: Payer: Self-pay | Admitting: Psychology

## 2018-10-01 NOTE — Progress Notes (Signed)
Neuropsychological Consultation   Patient:   [name]   DOB:   September 03, 1966  MR Number:  [id number]  Location:  [facility] [place], [place] [id number] [place] [address] Dept: [phone]           Date of Service:   09/25/2018  Start Time:   4 PM End Time:   5 PM  Provider/Observer:  Ilean Skill, Psy.D.       Clinical Neuropsychologist       Billing Code/Service: [phone] 4 Units  Chief Complaint:    [name] is a 52 year old female referred by Sarina Ill, MD., who is her neurologist.  The patient has had persistent and worsening reports of memory deficits, dizziness and severe headache.  The patient reports that this past March or April she started becoming increasingly dizzy and started falling.  She reports that prior to that in 2015 she started having increasing short-term memory difficulties.  The patient has a long history of migraines dating back to when she was 52 years old patient reports that her current migraine symptoms and patterns are significantly different than what it had been before.  The patient describes episodes of aura where she will get loss going to work, have severe vertigo become very inattentive and have difficulty with memory recall.  Reason for Service:  [name] is a 52 year old female referred by Dr. Delfin Edis, MD.  The patient describes increasingly worsening memory deficits, dizziness and vertigo and worsening severe vascular migraines.  The patient reports that she first started noticing difficulties with short-term memory in 2015 and she is seeing an increasing and worsening attention and memory functioning.  The patient reports that more recently she began experiencing increased dizziness and vertigo symptoms and has even had episodes where she fell.  The patient has had episodes where she became confused and lost going to  work and has now begun using a GPS system to make sure she is going the right way when she goes to work or comes home.  The patient describes significant attentional deficits and memory deficits that are persistently worsening.  The patient reports that she began misplacing keys and other recall and attentional issues in 2015.  The patient reports that she is also developed some new symptoms over the past 9 months or so.  She reports that there have been multiple occasions where she had a left-sided facial droop and that the symptoms occur a couple of times per week now.  She reports that her left hand and fingertips will become numb at the same time she has left facial droop.  In fact it was on 1 of these occasions when she was seen her neurosurgeon about her back when the technician noticed this and had a MRI done.  The patient reports that she has a long history of vascular migraines that go back to when she was 52 years of age.  The patient has been followed for her chronic migraine by [name], M.D. for several years now.  The patient reports that these headaches have changed over the past several months and become more severe.  The patient reports that she is gotten out of bed in the morning and felt like she was having vertigo and then developed a very severe headache.  The patient reports that when she feels like this that she has been unable to do tasks at work that she  is done for years and has had issues with driving.  The patient has not driven now for over 9 months.  The patient reports that she has a sister who is also had severe migraines and has now shown signs of having mini strokes with resulting findings on MRI.  The patient reports that her mother, sister, brother, daughter, and son all have moderate to severe migraines with her sister having the most problems with migraines resulting in residual neurological deficits with strokelike findings on her MRI.  The patient reports that she  has had difficulty with her left knee and currently needs a left knee replacement.  She reports that the pain in her left knee does not help her when she is dizzy and that she often has to hold onto things.  The patient reports that she has a very hard time falling asleep at night but this is been true for over 20 years.  The patient does describe attempts to have a neuropsychological evaluation done in 2015.  However, she reports that this did not go very well as her attention and memory at the day of the testing were so severe that the doctor doing the assessment apparently became very frustrated with the patient due to the level of deficits that were happening.  The patient's only other prior neurological history had to do with a head trauma/significant concussion in the past.  The patient reports that she fell out of the passenger door and landed on the pavement.  She hit the rear of her head.  The patient reports that she had a significant abrasion/contusion on her head but reports that she had returned back to her normal baseline within 2 days.  Current cognitive difficulties include difficulties writing checks, counting money, and having difficulty with words not making sense.  Current Status:  The patient describes significant and worsening memory deficits, attention and concentration deficits episodic significant executive functioning deficits, geographic disorientation at times and significant difficulties being able to drive and remember directions.  The patient has worsening symptoms of vascular migraines.  Behavioral Observation: [name]  presents as a 52 y.o.-year-old Right Caucasian Female who appeared her stated age. her dress was Appropriate and she was Well Groomed and her manners were Appropriate to the situation.  her participation was indicative of Appropriate and Redirectable behaviors.  There were not any physical disabilities noted.  she displayed an appropriate level of  cooperation and motivation.     Interactions:    Active Appropriate and Redirectable  Attention:   abnormal and attention span appeared shorter than expected for age  Memory:   abnormal; remote memory intact, recent memory impaired  Visuo-spatial:  not examined  Speech (Volume):  normal  Speech:   normal; normal  Thought Process:  Coherent and Relevant  Though Content:  WNL; not suicidal and not homicidal  Orientation:   person, place, time/date and situation  Judgment:   Good  Planning:   Good  Affect:    Anxious  Mood:    Dysphoric  Insight:   Good  Intelligence:   high  Marital Status/Living: The patient was born and raised in [place] and has 2 siblings.  She describes her mother's pregnancy with her as normal and the patient was carried to full-term.  The patient weighed 8 pounds at birth.  The only major childhood illness had to do with an episode of low platelet count and the patient was admitted for 10 days.  The patient  currently lives with her husband of 31 years and their 2 children.  The patient has a 66 year old brother who has diabetes and migraines and a sister was 56 years old who was anemic and has pancreatitis as well as severe migraines.  The patient has a 62 year old son who is very healthy and a 71 year old daughter who has some medical issues.  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  Past Employment:  The patient previously worked as a Mudlogger.  Her longest period of employment was 18 years.  Substance Use:  No concerns of substance abuse are reported.    Education:   The patient graduated from high school and completed her associates degree at [employer].  Medical History:   Past Medical History:  Diagnosis Date  . Allergy   . Dizziness   . Gastroparesis    [facility]  . Migraine    [facility]  . PONV (postoperative nausea and vomiting)   . Weight decrease          Abuse/Trauma History: The patient did not describe any history of abuse or trauma.  Psychiatric History:  The patient denies any significant prior psychiatric history.  She has no medical history in her chart suggesting depression or anxiety or certainly no significant psychiatric illness.  Family Med/Psych History:  Family History  Problem Relation Age of Onset  . Diabetes Mother   . Hypertension Mother   . Thyroid disease Mother   . Cancer Father   . Diabetes Father   . Hypertension Father   . Thyroid disease Sister   . Diabetes Brother   . Diabetes Maternal Grandmother   . Cancer Maternal Grandfather     Risk of Suicide/Violence: low the patient denies any suicidal or homicidal ideation.  Impression/DX:  [name] is a 52 year old female referred by Sarina Ill, MD., who is her neurologist.  The patient has had persistent and worsening reports of memory deficits, dizziness and severe headache.  The patient reports that this past March or April she started becoming increasingly dizzy and started falling.  She reports that prior to that in 2015 she started having increasing short-term memory difficulties.  The patient has a long history of migraines dating back to when she was 52 years old patient reports that her current migraine symptoms and patterns are significantly different than what it had been before.  The patient describes episodes of aura where she will get loss going to work, have severe vertigo become very inattentive and have difficulty with memory recall.  At this point the primary differential diagnoses has to do with vascular relationship to her current cognitive difficulties.  Disposition/Plan:  We have set the patient up for formal neuropsychological testing and will initially use the Wechsler Adult Intelligence Scale-IV as well as the Wechsler Memory Scale-IV.  We will then make a determination whether further neuropsychological testing is needed to  provide diagnostic impressions and recommendations.  Diagnosis:    Memory loss due to medical condition  Intractable migraine without aura and with status migrainosus  Insomnia, persistent         Electronically Signed   _______________________ Ilean Skill, Psy.D.

## 2018-10-13 ENCOUNTER — Ambulatory Visit: Payer: 59 | Admitting: Psychology

## 2018-10-18 ENCOUNTER — Ambulatory Visit: Payer: Self-pay | Admitting: Neurology

## 2018-10-27 ENCOUNTER — Encounter: Payer: 59 | Attending: Psychology | Admitting: Psychology

## 2018-10-27 ENCOUNTER — Encounter: Payer: Self-pay | Admitting: Psychology

## 2018-10-27 DIAGNOSIS — G43011 Migraine without aura, intractable, with status migrainosus: Secondary | ICD-10-CM | POA: Diagnosis not present

## 2018-10-27 DIAGNOSIS — R413 Other amnesia: Secondary | ICD-10-CM

## 2018-10-27 DIAGNOSIS — G4709 Other insomnia: Secondary | ICD-10-CM | POA: Insufficient documentation

## 2018-10-27 NOTE — Progress Notes (Signed)
Behavioral Observations: The patient arrived to her appointment on time. She was administered the Wechsler Adult Intelligence Scale, 4th Edition, and Wechsler Memory Scale, 4th Edition, Adult Battery. Testing lasted around 4 hours. She was dressed casually and appeared to have good hygiene. She was alert and fully oriented with good eye contact. She displayed psychomotor agitation and was observed as restless throughout testing. Her spontaneous speech was clear and prosodic with normal rate, volume, and tone. Language was remarkably intact and a notable strength. Receptive language appeared intact. She ambulated without difficulty. Mood was moderately anxious and somewhat depressed with congruent affect. Thought processes were slightly disorganized and tangential, and thought content was within broad normal limits. Insight and judgment are limited.   She was cooperative with all tasks presented to her. She did not have any difficulty comprehending the directions for any tasks and did not require additional prompting or instructions to be repeated. She was able to complete all tests administered to her and exhibited below average frustration tolerance on questions she did not know or tasks that were more difficult.  Results of WAIS-IV   Composite Score Summary  Scale Sum of Scaled Scores Composite Score Percentile Rank 95% Conf. Interval Qualitative Description  Verbal Comprehension 27 VCI 95 37 90-101 Average  Perceptual Reasoning 24 PRI 88 21 82-95 Low Average  Working Memory 18 WMI 95 37 89-102 Average  Processing Speed 19 PSI 97 42 89-106 Average  Full Scale 88 FSIQ 92 30 88-96 Average  General Ability 51 GAI 91 27 86-96 Average   Results of WMS-IV, Adult Battery  Brief Cognitive Status Exam Classification  Age Years of Education Raw Score Classification Level Base Rate  52 years 10 months 14 48 Low Average 22.1    Index Score Summary  Index Sum of Scaled Scores Index Score  Percentile Rank 95% Confidence Interval Qualitative Descriptor  Auditory Memory (AMI) 34 91 27 85-98 Average  Visual Memory (VMI) 37 95 37 90-101 Average  Visual Working Memory (VWMI) 17 91 27 84-99 Average  Immediate Memory (IMI) 34 89 23 83-96 Low Average  Delayed Memory (DMI) 37 94 34 88-101 Average    Primary Subtest Scaled Score Summary  Subtest Domain Raw Score Scaled Score Percentile Rank  Logical Memory I AM 21 8 25   Logical Memory II AM 19 9 37  Verbal Paired Associates I AM 28 9 37  Verbal Paired Associates II AM 8 8 25   Designs I VM 68 10 50  Designs II VM 56 10 50  Visual Reproduction I VM 30 7 16   Visual Reproduction II VM 25 10 50  Spatial Addition VWM 9 7 16   Symbol Span VWM 22 10 50

## 2018-10-30 ENCOUNTER — Encounter: Payer: Self-pay | Admitting: Psychology

## 2018-11-06 ENCOUNTER — Ambulatory Visit: Payer: Self-pay | Admitting: Neurology

## 2018-11-13 ENCOUNTER — Encounter: Payer: Self-pay | Admitting: Psychology

## 2018-11-16 ENCOUNTER — Encounter: Payer: 59 | Attending: Psychology | Admitting: Psychology

## 2018-11-16 ENCOUNTER — Encounter: Payer: Self-pay | Admitting: Psychology

## 2018-11-16 DIAGNOSIS — G43709 Chronic migraine without aura, not intractable, without status migrainosus: Secondary | ICD-10-CM

## 2018-11-16 DIAGNOSIS — G4709 Other insomnia: Secondary | ICD-10-CM | POA: Insufficient documentation

## 2018-11-16 DIAGNOSIS — R413 Other amnesia: Secondary | ICD-10-CM | POA: Diagnosis not present

## 2018-11-16 DIAGNOSIS — G43011 Migraine without aura, intractable, with status migrainosus: Secondary | ICD-10-CM | POA: Insufficient documentation

## 2018-11-16 DIAGNOSIS — G4700 Insomnia, unspecified: Secondary | ICD-10-CM

## 2018-11-16 DIAGNOSIS — F331 Major depressive disorder, recurrent, moderate: Secondary | ICD-10-CM | POA: Diagnosis not present

## 2018-11-16 DIAGNOSIS — IMO0002 Reserved for concepts with insufficient information to code with codable children: Secondary | ICD-10-CM

## 2018-11-16 NOTE — Progress Notes (Addendum)
Patient:  [name]   DOB: September 06, 1966  MR Number: [id number]  Location: [place] [facility]IC[facility] AND REHABILIT[facility] [address], [place] [id number] [facility] [place] [address] Dept: [phone]  Start: 11 AM End: 12 PM  Provider/Observer:     Edgardo Roys PsyD  Chief Complaint:      Chief Complaint  Patient presents with  . Anxiety  . Stress  . Headache  . Migraine  . Memory Loss  . Other    Reason For Service:     [name] is a 53 year old female referred by Dr. Delfin Edis, MD.  The patient describes increasingly worsening memory deficits, dizziness and vertigo and worsening severe vascular migraines.  The patient reports that she first started noticing difficulties with short-term memory in 2015 and she is seeing an increasing and worsening attention and memory functioning.  The patient reports that more recently she began experiencing increased dizziness and vertigo symptoms and has even had episodes where she fell.  The patient has had episodes where she became confused and lost going to work and has now begun using a GPS system to make sure she is going the right way when she goes to work or comes home.  The patient describes significant attentional deficits and memory deficits that are persistently worsening.  The patient reports that she began misplacing keys and other recall and attentional issues in 2015.  The patient reports that she is also developed some new symptoms over the past 9 months or so.  She reports that there have been multiple occasions where she had a left-sided facial droop and that the symptoms occur a couple of times per week now.  She reports that her left hand and fingertips will become numb at the same time she has left facial droop.  In fact it was on 1 of these occasions when she was seen her neurosurgeon about her back when the technician noticed this and had a MRI  done.  The patient reports that she has a long history of vascular migraines that go back to when she was 53 years of age.  The patient has been followed for her chronic migraine by Amil Amen, M.D. for several years now.  The patient reports that these headaches have changed over the past several months and become more severe.  The patient reports that she is gotten out of bed in the morning and felt like she was having vertigo and then developed a very severe headache.  The patient reports that when she feels like this that she has been unable to do tasks at work that she is done for years and has had issues with driving.  The patient has not driven now for over 9 months.  The patient reports that she has a sister who is also had severe migraines and has now shown signs of having mini strokes with resulting findings on MRI.  The patient reports that her mother, sister, brother, daughter, and son all have moderate to severe migraines with her sister having the most problems with migraines resulting in residual neurological deficits with strokelike findings on her MRI.  The patient reports that she has had difficulty with her left knee and currently needs a left knee replacement.  She reports that the pain in her left knee does not help her when she is dizzy and that she often has to hold onto things.  The patient reports that she has  a very hard time falling asleep at night but this is been true for over 20 years.  The patient does describe attempts to have a neuropsychological evaluation done in 2015.  However, she reports that this did not go very well as her attention and memory at the day of the testing were so severe that the doctor doing the assessment apparently became very frustrated with the patient due to the level of deficits that were happening.  The patient's only other prior neurological history had to do with a head trauma/significant concussion in the past.  The patient reports  that she fell out of the passenger door and landed on the pavement.  She hit the rear of her head.  The patient reports that she had a significant abrasion/contusion on her head but reports that she had returned back to her normal baseline within 2 days.  Current cognitive difficulties include difficulties writing checks, counting money, and having difficulty with words not making sense.  Testing Administered:  The patient was administered the Wechsler Adult Intelligence Scale-IV as well as the Wechsler Memory Scale-IV.  These measures were administered on 10/27/2018.  Below are the behavioral observations obtained by Dr. Darol Destine during the administration of these neuropsychological measures.  Behavioral Observations: The patient arrived to her appointment on time. She was administered the Wechsler Adult Intelligence Scale, 4th Edition, and Wechsler Memory Scale, 4th Edition, Adult Battery. Testing lasted around 4 hours. She was dressed casually and appeared to have good hygiene. She was alert and fully oriented with good eye contact. She displayed psychomotor agitation and was observed as restless throughout testing. Her spontaneous speech was clear and prosodic with normal rate, volume, and tone. Language was remarkably intact and a notable strength. Receptive language appeared intact. She ambulated without difficulty. Mood was moderately anxious and somewhat depressed with congruent affect. Thought processes were slightly disorganized and tangential, and thought content was within broad normal limits. Insight and judgment are limited.   She was cooperative with all tasks presented to her. She did not have any difficulty comprehending the directions for any tasks and did not require additional prompting or instructions to be repeated. She was able to complete all tests administered to her and exhibited below average frustration tolerance on questions she did not know or tasks that were more  difficult.   Test Results:   Initially, the patient was administered the Wechsler Adult Intelligence Scale-IV.  Below you will find graphs of the patient's performance on different indices and subtest with interpretation of these findings.   Composite Score Summary  Scale Sum of Scaled Scores Composite Score Percentile Rank 95% Conf. Interval Qualitative Description  Verbal Comprehension 27 VCI 95 37 90-101 Average  Perceptual Reasoning 24 PRI 88 21 82-95 Low Average  Working Memory 18 WMI 95 37 89-102 Average  Processing Speed 19 PSI 97 42 89-106 Average  Full Scale 88 FSIQ 92 30 88-96 Average  General Ability 51 GAI 91 27 86-96 Average   The patient produced a full scale IQ score of 92 which falls at the 30th percentile and is in the average range.  It is only slightly below and not statistically significantly below what would be predicted based on her education and occupational history.  The patient also produced a global abilities index score of 91 which falls at the 27th percentile and is also in the lower end of the average range.  The patient produced a verbal comprehension index score of 95 which falls at the  37th percentile and is in the average range.  The patient produced a perceptual reasoning index score of 88 which is at the 21st percentile and is the low average range.  This is her lowest performing index score suggesting some issues with regard to visual spatial and visual reasoning abilities.  The patient produced a working memory index score of 95 which is at the 37th percentile and is in the average range.  The patient produced a processing speed index score of 97 which is at the 47th percentile and is the patient's highest level of functioning.  Overall, the pattern of these index scores show consistency across a broad range of neuropsychological and cognitive functioning with no statistically significant areas of deficits.  The patient performed in the average range on all  measures with the exception of a low average range performance with regard to perceptual reasoning and visual-spatial skills while the patient's verbal comprehension, working memory and encoding, and information processing speed were all consistent with one another and in the average range.  Verbal Comprehension Subtests Summary  Subtest Raw Score Scaled Score Percentile Rank Reference Group Scaled Score SEM  Similarities 24 9 37 10 1.04  Vocabulary 40 10 50 11 0.73  Information 10 8 25 8  0.73  (Comprehension) 23 9 37 10 1.16   The patient produced a verbal comprehension index score of 95 which falls at the 37th percentile and is in the average range.  Individual subtest making up this measure show that the patient had considerable consistency from subtest performance with only a maximum of two-point spread with regard to scaled scores.  The patient's verbal reasoning and problem-solving abilities, vocabulary, general fund of information, and social judgment/comprehension were all in the average range.  There were no areas of statistically significant deficits or indications of impairments.  Perceptual Reasoning Subtests Summary  Subtest Raw Score Scaled Score Percentile Rank Reference Group Scaled Score SEM  Block Design 29 8 25 7  0.95  Matrix Reasoning 12 7 16 6  0.95  Visual Puzzles 13 9 37 8 0.85  (Figure Weights) 15 11 63 10 0.90  (Picture Completion) 12 9 37 9 1.24    The patient produced a perceptual reasoning index score of 88 which falls at the 21st percentile and is in the low average range.  This is the patient's lowest area of functioning.  There was some variability within subtest performance.  For example, the patient had one subtest which fell in the mildly impaired range with regard to visual reasoning and problem-solving.  However, 2 other measures of visual reasoning and problem-solving were in the average range and in fact 1 of them with regard to estimation and prediction was  her highest area of functioning.  Therefore it does not look to be any indication of significant deficits overall with regard to perceptual reasoning and problem-solving.  Working Doctor, general practice Raw Score Scaled Score Percentile Rank Reference Group Scaled Score SEM  Digit Span 29 11 63 10 0.73  Arithmetic 10 7 16 7  0.90  (Letter-Number Seq.) 22 11 63 11 1.04   The patient produced a working memory index score of 95 which falls at the 37th percentile and is in the average range.  Individual subtest making up this measure show that the patient's simple auditory encoding was at the 63rd percentile and overall doing quite well.  On 2 measures requiring a little bit more demanding challenge the patient had 1 score that was in the mildly  impaired range (arithmetic) which is a measure that requires both attention and concentration to auditory information as well as manipulation and processing of the information.  This item was mildly impaired.  However, on another measure of combinations of auditory encoding and multi processing/processing information the patient produced a performance at the 63rd percentile.  Overall, there is no consistent pattern of any significant deficits with regard to auditory encoding and attention/concentration measures.  Processing Speed Subtests Summary  Subtest Raw Score Scaled Score Percentile Rank Reference Group Scaled Score SEM  Symbol Search 31 10 50 9 1.56  Coding 60 9 37 8 1.20  (Cancellation) 31 8 25 7  1.62   The patient produced a processing speed index score of 97 which falls at the 42nd percentile and is in the average range.  There was considerable consistency across subtest measures on this index with subtest showing the patient's average performance with regard to visual scanning, visual searching, overall speed of mental operations, and sustaining attention on a simple focus execute task.  Overall, this pattern does not suggest any significant  cognitive deficits in this area.   ABILITY-MEMORY ANALYSIS  Ability Score:  GAI: 91 Date of Testing:  WAIS-IV; WMS-IV 2018/10/27  Predicted Difference Method   Index Predicted WMS-IV Index Score Actual WMS-IV Index Score Difference Critical Value  Significant Difference Y/N Base Rate  Auditory Memory 95 91 4 9.38 N   Visual Memory 95 95 0 9.36 N   Visual Working Memory 94 91 3 11.05 N   Immediate Memory 94 89 5 10.29 N   Delayed Memory 95 94 1 10.13 N   Statistical significance (critical value) at the .01 level.    The patient was then administered the Wechsler memory scale-IV to get an objective assessment of a wide range of various auditory and visual memory functions as well as measures of visual encoding and previous measures of auditory encoding.  The patient's achieved performance on these memory tasks were analyzed using a predictive method in which we predict the patient's expected performance utilizing her global abilities index score that she achieved from the [place] adult intelligence scale.  Her global abilities index score was 91 and this is likely a rather conservative estimate of where she should be.  We will take this predicted score and compare it to her actual level of performance.  Overall, there was significant consistency between predicted levels of memory performance on almost all aspects of memory assessed.  There were no measures that were statistically below predicted levels of performance and there were no areas of consistency showing any type of significant memory deficits on objective measures.  Initially, the patient was assessed looking at the immediate memory index score which looks at both immediate auditory and immediate visual memory.  The patient produced an actual index score of 89 with the predicted index score of 94.  This is only a five-point difference in his not seen a statistically significant or indicative of any significant neuropsychological  deficits.  When breaking down this immediate memory and auditory and visual memory tasks, the patient's performance was quite good on both measures and her auditory and visual memories were both consistent with predicted levels of performance.  The patient's visual working memory was also consistent with predicted levels and was in the lower end of the average range which is also a pattern of attention and concentration for auditory working memory identified on the [place] adult intelligence scale.  The patient's delayed memory index score was  94 with the predicted level of performance at 95.  These 2 scores are essentially the same and are not indicative of any significant memory or cognitive deficits on objective measurements compared to a normative comparison group.   Summary of Results:   Overall, the patient showed significant stability and consistency across a broad range of neuropsychological and cognitive areas of functioning.  There were no indications of any significant neuropsychological or cognitive deficits.  The patient showed average performance with regard to her verbal comprehension, visual-spatial abilities, auditory working memory, processing speed measures and produced a full scale composite index score in the average range.  There was no statistically significant areas of cognitive deficits with her greatest area performance only 15 or 20 percentile ranks difference between her best performance and worse performing areas of functioning.  Looking at subtest all subtest fell in the average range with the exception of one measure of visual reasoning and problem-solving while 2 or 3 other measures of visual reasoning and problem-solving were in the average range.  The patient had one measure of attention with a multi processing component in the mildly impaired range but 2 other measures of auditory encoding and multi processing abilities were in the average range and were in fact 1 of her better  areas of performance.  As far as memory functioning the patient appears to be doing recently well with regard to both auditory and visual encoding abilities.  The patient's immediate memory for both auditory and visual information as well as delayed memory for this information and ability to retain and over time were all consistent with predicted levels with no areas identified as being impaired.  Impression/Diagnosis:   Overall, the results of the current neuropsychological evaluation are not consistent with any consistent or objective findings of neuropsychological deficits.  There was extreme consistency across subtest performance with almost every subtest falling in the average range and any item that which showed any mild difficulty had other subtests measuring similar things that were in the average range.  There were no indications of significant objective memory deficits.  However, the patient's reports of cognitive difficulties related to problems with memory and cognitive functioning do persist and the patient does have significant variables that could be playing a role with this.  The patient has significant and recurrent chronic migraines and the acute effects of chronic migraines on cognitive functioning due to pain and acute vascular changes can cause functional difficulties.  The patient's MRI does not show any signs of of residual neurological injury or damage from these migraines.  The patient also has significant and sustained stressors in her life that have had and will likely continue to have significant deleterious effect on her functioning and coping.  While I in no way question the validity of the patient's cognitive difficulties, I do think that the etiological factors of these are more related to functional causes including emotional distress, sleep disturbance and pain symptoms that while are significant and can have an significant functional impact on the patient's cognitive  functioning do not appear to be related to underlying brain dysfunction or brain injury.  There were no indications of objective findings of cognitive deficits or memory deficits.  I will provide feedback to the patient regarding the results of the current neuropsychological evaluation.  I will also provide this information to her neurologist.  I do think that it will be important to address these very significant and important functional issues that are having such a deleterious effect  on the patient's overall coping and life functioning.  Depression, anxiety and significant pain along with significant psychosocial variables all need to be addressed going forward.  Diagnosis:    Axis I: Chronic migraine  Moderate episode of recurrent major depressive disorder ([place])  Persistent disorder of initiating or maintaining sleep  Memory loss   Ilean Skill, Psy.D. Neuropsychologist        WAIS-IV Wechsler Adult Intelligence Scale-Fourth Edition Score Report   Examinee Name Whittley Carandang  Date of Report 2018/11/16  Justice Rocher [id number]  Years of Education   Date of Birth 05-01-1966  Primary Language   Gender Female  Handedness   Race/Ethnicity   Examiner Name Ilean Skill  Date of Testing 2018/10/27  Age at Testing 59 years 10 months  Retest? No   Comments: Composite Score Summary  Scale Sum of Scaled Scores Composite Score Percentile Rank 95% Conf. Interval Qualitative Description  Verbal Comprehension 27 VCI 95 37 90-101 Average  Perceptual Reasoning 24 PRI 88 21 82-95 Low Average  Working Memory 18 WMI 95 37 89-102 Average  Processing Speed 19 PSI 97 42 89-106 Average  Full Scale 88 FSIQ 92 30 88-96 Average  General Ability 51 GAI 91 27 86-96 Average  Confidence Intervals are based on the Overall Average SEMs. The [facility] is an optional composite summary score that is less sensitive to the influence of working memory and processing speed. Because working memory and  processing speed are vital to a comprehensive evaluation of cognitive ability, it should be noted that the [facility] does not have the breadth of construct coverage as the FSIQ.     ote. The vertical bars represent the standard error of measurement (SEM). SEM values are based on the examinee's age.   ANALYSIS   Index Level Discrepancy Comparisons  Comparison Score 1 Score 2 Difference Critical Value .05 Significant Difference Y/N Base Rate by Overall Sample  VCI - PRI 95 88 7 7.78 N 30.5  VCI - WMI 95 95 0 8.31 N   VCI - PSI 95 97 -2 11.76 N 46.7  PRI - WMI 88 95 -7 8.81 N 31.8  PRI - PSI 88 97 -9 12.12 N 28.5  WMI - PSI 95 97 -2 12.47 N 46.5  FSIQ - GAI 92 91 1 3.29 N 46.3  Base Rate by Overall Sample. Statistical significance (critical value) at the .05 level.  Verbal Comprehension Subtests Summary  Subtest Raw Score Scaled Score Percentile Rank Reference Group Scaled Score SEM  Similarities 24 9 37 10 1.04  Vocabulary 40 10 50 11 0.73  Information 10 8 25 8  0.73  (Comprehension) 23 9 37 10 1.16   Perceptual Reasoning Subtests Summary  Subtest Raw Score Scaled Score Percentile Rank Reference Group Scaled Score SEM  Block Design 29 8 25 7  0.95  Matrix Reasoning 12 7 16 6  0.95  Visual Puzzles 13 9 37 8 0.85  (Figure Weights) 15 11 63 10 0.90  (Picture Completion) 12 9 37 9 1.24   Working Doctor, general practice Raw Score Scaled Score Percentile Rank Reference Group Scaled Score SEM  Digit Span 29 11 63 10 0.73  Arithmetic 10 7 16 7  0.90  (Letter-Number Seq.) 22 11 63 11 1.04   Processing Speed Subtests Summary  Subtest Raw Score Scaled Score Percentile Rank Reference Group Scaled Score SEM  Symbol Search 31 10 50 9 1.56  Coding 60 9 37 8 1.20  (Cancellation) 31 8 25 7  1.62  Subtest Level Discrepancy Comparisons  Subtest Comparison Score 1 Score 2 Difference Critical Value .05 Significant Difference Y/N Base Rate  Digit Span - Arithmetic 11 7 4  2.57 Y 9.40   Symbol Search - Coding 10 9 1  3.41 N 42.60  Statistical significance (critical value) at the .05 level.       DETERMINING STRENGTHS AND WEAKNESSES   Differences Between Subtest and Overall Mean of Subtest Scores  Subtest Subtest Scaled Score Mean Scaled Score Difference Critical Value .05 Strength or Weakness Base Rate  Block Design 8 8.80 -0.80 2.85  >25%  Similarities 9 8.80 0.20 2.82  >25%  Digit Span 11 8.80 2.20 2.22  >25%  Matrix Reasoning 7 8.80 -1.80 2.54  >25%  Vocabulary 10 8.80 1.20 2.03  >25%  Arithmetic 7 8.80 -1.80 2.73  >25%  Symbol Search 10 8.80 1.20 3.42  >25%  Visual Puzzles 9 8.80 0.20 2.71  >25%  Information 8 8.80 -0.80 2.19  >25%  Coding 9 8.80 0.20 2.97  >25%  Overall: Mean = 8.80, Scatter =  4, Base rate =  96.1 Base Rate for Intersubtest Scatter is reported for 10 Subtests. Statistical significance (critical value) at the .05 level.   PROCESS ANALYSIS   Perceptual Reasoning Process Score Summary  Process Score Raw Score Scaled Score Percentile Rank SEM  Block Design No Time Bonus 28 7 16  1.08    Working Memory Process Score Summary  Process Score Raw Score Scaled Score Percentile Rank Base Rate SEM  Digit Span Forward 11 11 63 -- 1.24  Digit Span Backward 10 11 63 -- 1.12  Digit Span Sequencing 8 9 37 -- 1.27  Longest Digit Span Forward 7 -- -- 61.5 --  Longest Digit Span Backward 6 -- -- 31.0 --  Longest Digit Span Sequence 6 -- -- 66.0 --  Longest Letter-Number Sequence 6 -- -- 46.0 --    Process Level Discrepancy Comparisons  Process Comparison Score 1 Score 2 Difference Critical Value .05 Significant Difference Y/N Base Rate  Block Design - Block Design No Time Bonus 8 7 1  3.08 N 21.5  Digit Span Forward - Digit Span Backward 11 11 0 3.65 N   Digit Span Forward - Digit Span Sequencing 11 9 2  3.60 N 31.3  Digit Span Backward - Digit Span Sequencing 11 9 2  3.56 N 29.9  Longest Digit Span Forward - Longest Digit Span Backward  7 6 1  -- -- 84.5  Longest Digit Span Forward - Longest Digit Span Sequence 7 6 1  -- -- 67.0  Longest Digit Span Backward - Longest Digit Span Sequence 6 6 0 -- --   Statistical significance (critical value) at the .05 level.   Raw Scores  Subtest Score Range Raw Score  Process Score Range Raw Score  Block Design 0-66 29  Block Design No Time Bonus 0-48 28  Similarities 0-36 24  Digit Span Forward 0-16 11  Digit Span 0-48 29  Digit Span Backward 0-16 10  Matrix Reasoning 0-26 12  Digit Span Sequencing 0-16 8  Vocabulary 0-57 40  Longest Digit Span Forward 0, 2-9 7  Arithmetic 0-22 10  Longest Digit Span Backward 0, 2-8 6  Symbol Search 0-60 31  Longest Digit Span Sequence 0, 2-9 6  Visual Puzzles 0-26 13  Longest Letter-Number Seq. 0, 2-8 6  Information 0-26 10      Coding 0-135 60      Letter-Number Seq. 0-30 22      Figure Weights  0-27 15      Comprehension 0-36 23      Cancellation 0-72 31      Picture Completion 0-24 12          WMS-IV Wechsler Memory Scale-Fourth Edition Score Report   Examinee Name Satia Winger  Date of Report 2018/11/16  Justice Rocher [id number]  Years of Education 37  Date of Birth [birth year]  Home Language   Gender Female  Handedness Not Specified  Race/Ethnicity   Examiner Name Ilean Skill  Date of Testing 2018/10/27  Age at Testing 68 years 10 months  Retest? No   Comments: Brief Cognitive Status Exam Classification  Age Years of Education Raw Score Classification Level Base Rate  52 years 10 months 16 48 Borderline 9.5    Index Score Summary  Index Sum of Scaled Scores Index Score Percentile Rank 95% Confidence Interval Qualitative Descriptor  Auditory Memory (AMI) 34 91 27 85-98 Average  Visual Memory (VMI) 37 95 37 90-101 Average  Visual Working Memory VWMI) 17 91 27 84-99 Average  Immediate Memory (IMI) 34 89 23 83-96 Low Average  Delayed Memory (DMI) 37 94 34 88-101 Average    Index Score Profile  The vertical bars  represent the standard error of measurement (SEM).   Primary Subtest Scaled Score Summary  Subtest Domain Raw Score Scaled Score Percentile Rank  Logical Memory I AM 21 8 25   Logical Memory II AM 19 9 37  Verbal Paired Associates I AM 28 9 37  Verbal Paired Associates II AM 8 8 25   Designs I VM 68 10 50  Designs II VM 56 10 50  Visual Reproduction I VM 30 7 16   Visual Reproduction II VM 25 10 50  Spatial Addition VWM 9 7 16   Symbol Span VWM 22 10 50   Primary Subtest Scaled Score Profile    PROCESS SCORE CONVERSIONS  Auditory Memory Process Score Summary  Process Score Raw Score Scaled Score Percentile Rank Cumulative Percentage (Base Rate)  LM II Recognition 22 - - 17-25%  VPA II Recognition 40 - - >75%   Visual Memory Process Score Summary  Process Score Raw Score Scaled Score Percentile Rank Cumulative Percentage (Base Rate)  DE I Content 44 15 95 -  DE I Spatial 12 6 9  -  DE II Content 39 13 84 -  DE II Spatial 11 9 37 -  DE II Recognition 15 - - 51-75%  VR II Recognition 6 - - 51-75%    SUBTEST-LEVEL DIFFERENCES WITHIN INDEXES  Auditory Memory Index  Subtest Scaled Score AMI Mean Score Difference from Mean Critical Value Base Rate  Logical Memory I 8 8.50 -0.50 2.64 >25%  Logical Memory II 9 8.50 0.50 2.48 >25%  Verbal Paired Associates I 9 8.50 0.50 1.90 >25%  Verbal Paired Associates II 8 8.50 -0.50 2.48 >25%  Statistical significance (critical value) at the .05 level.   Visual Memory Index  Subtest Scaled Score VMI Mean Score Difference from Mean Critical Value Base Rate  Designs I 10 9.25 0.75 2.38 >25%  Designs II 10 9.25 0.75 2.38 >25%  Visual Reproduction I 7 9.25 -2.25 1.86 25%  Visual Reproduction II 10 9.25 0.75 1.48 >25%  Statistical significance (critical value) at the .05 level.   Immediate Memory Index  Subtest Scaled Score IMI Mean Score Difference from Mean Critical Value Base Rate  Logical Memory I 8 8.50 -0.50 2.59 >25%  Verbal  Paired Associates I 9 8.50 0.50 1.82 >25%  Designs I 10 8.50 1.50 2.42 >25%  Visual Reproduction I 7 8.50 -1.50 1.91 >25%  Statistical significance (critical value) at the .05 level.   Delayed Memory Index  Subtest Scaled Score DMI Mean Score Difference from Mean Critical Value Base Rate  Logical Memory II 9 9.25 -0.25 2.44 >25%  Verbal Paired Associates II 8 9.25 -1.25 2.44 >25%  Designs II 10 9.25 0.75 2.44 >25%  Visual Reproduction II 10 9.25 0.75 1.57 >25%  Statistical significance (critical value) at the .05 level.    SUBTEST DISCREPANCY COMPARISON  Comparison Score 1 Score 2 Difference Critical Value Base Rate  Spatial Addition - Symbol Span 7 10 -3 2.74 41.8  Statistical significance (critical value) at the .05 level.    SUBTEST-LEVEL CONTRAST SCALED SCORES  Logical Memory  Score Score 1 Score 2 Contrast Scaled Score  LM II Recognition vs. Delayed Recall 17-25% 9 11  LM Immediate Recall vs. Delayed Recall 8 9 11    Verbal Paired Associates  Score Score 1 Score 2 Contrast Scaled Score  VPA II Recognition vs. Delayed Recall >75% 8 6  VPA Immediate Recall vs. Delayed Recall 9 8 8    Designs  Score Score 1 Score 2 Contrast Scaled Score  DE I Spatial vs. Content 6 15 19   DE II Spatial vs. Content 9 13 14   DE II Recognition vs. Delayed Recall 51-75% 10 9  DE Immediate Recall vs. Delayed Recall 10 10 10    Visual Reproduction  Score Score 1 Score 2 Contrast Scaled Score  VR II Recognition vs. Delayed Recall 51-75% 10 10  VR Immediate Recall vs. Delayed Recall 7 10 13     INDEX-LEVEL CONTRAST SCALED SCORES  WMS-IV Indexes  Score Score 1 Score 2 Contrast Scaled Score  Auditory Memory Index vs. Visual Memory Index 91 95 9  Visual Working Memory Index vs. Visual Memory Index 91 95 10  Immediate Memory Index vs. Delayed Memory Index 89 94 12    RAW SCORES  Subtest Score Range Adult Score Range Older Adult Raw Score  Brief Cognitive Status Exam 0-58 0-58 48   Logical Memory I 0-50 0-53 21  Logical Memory II 0-50 0-39 19  Verbal Paired Associates I 0-56 0-40 28  Verbal Paired Associates II 0-14 0-10 8  CVLT-II Trials 1-5 5-95 5-95   CVLT-II Long-Delay -5-5 -5-5   Designs I 0-120  68  Designs II 0-120  56  Visual Reproduction I 0-43 0-43 30  Visual Reproduction II 0-43 0-43 25  Spatial Addition 0-24  9  Symbol Span 0-50 0-50 22  Process Score Range Adult Score Range Older Adult Raw Score  LM II Recognition 0-30 0-23 22  VPA II Recognition 0-40 0-30 40  VPA II Word Recall 0-28 0-20   DE I Content 0-48  44  DE I Spatial 0-24  12  DE II Content 0-48  39  DE II Spatial 0-24  11  DE II Recognition 0-24  15  VR II Recognition 0-7 0-7 6  VR II Copy 0-43 0-43     ABILITY-MEMORY ANALYSIS  Ability Score:  GAI: 91 Date of Testing:  WAIS-IV; WMS-IV 2018/10/27  Predicted Difference Method   Index Predicted WMS-IV Index Score Actual WMS-IV Index Score Difference Critical Value  Significant Difference Y/N Base Rate  Auditory Memory 95 91 4 9.38 N   Visual Memory 95 95 0 9.36 N   Visual Working Memory 94 91 3 11.05 N   Immediate Memory 94 89 5 10.29  N   Delayed Memory 95 94 1 10.13 N   Statistical significance (critical value) at the .01 level.   Contrast Scaled Scores  Score Score 1 Score 2 Contrast Scaled Score  General Ability Index vs. Auditory Memory Index 91 91 9  General Ability Index vs. Visual Memory Index 91 95 10  General Ability Index vs. Visual Working Memory Index 91 91 9  General Ability Index vs. Immediate Memory Index 91 89 8  General Ability Index vs. Delayed Memory Index 91 94 9  Verbal Comprehension Index vs. Auditory Memory Index 95 91 8  Perceptual Reasoning Index vs. Visual Memory Index 88 95 11  Perceptual Reasoning Index vs. Visual Working Memory Index 88 91 10  Working Memory Index vs. Auditory Memory Index 95 91 8  Working Memory Index vs. Visual Working Memory Index 95 91 8    End of Report

## 2018-11-23 ENCOUNTER — Ambulatory Visit: Payer: 59 | Admitting: Psychology

## 2018-11-30 ENCOUNTER — Ambulatory Visit: Payer: 59 | Admitting: Psychology

## 2018-12-04 ENCOUNTER — Encounter: Payer: 59 | Admitting: Psychology

## 2018-12-04 DIAGNOSIS — G43709 Chronic migraine without aura, not intractable, without status migrainosus: Secondary | ICD-10-CM

## 2018-12-04 DIAGNOSIS — IMO0002 Reserved for concepts with insufficient information to code with codable children: Secondary | ICD-10-CM

## 2018-12-04 DIAGNOSIS — F331 Major depressive disorder, recurrent, moderate: Secondary | ICD-10-CM | POA: Diagnosis not present

## 2018-12-04 DIAGNOSIS — G4700 Insomnia, unspecified: Secondary | ICD-10-CM

## 2018-12-04 DIAGNOSIS — R413 Other amnesia: Secondary | ICD-10-CM | POA: Diagnosis not present

## 2018-12-07 NOTE — Progress Notes (Signed)
Today I provided feedback regarding the results of the recent neuropsychological evaluation.  The complete evaluation to be found on her visit dated 11/16/2018.  I will include the summary and results sections from that evaluation here for quick review.  The patient was able to understand and appropriately address the feedback and results as far as the opinion and interpretation of these results.  The patient was quite open towards working on issues around depression anxiety and ongoing pain issues and significant sleep deprivation/disturbance issues.  Summary of Results:                        Overall, the patient showed significant stability and consistency across a broad range of neuropsychological and cognitive areas of functioning.  There were no indications of any significant neuropsychological or cognitive deficits.  The patient showed average performance with regard to her verbal comprehension, visual-spatial abilities, auditory working memory, processing speed measures and produced a full scale composite index score in the average range.  There was no statistically significant areas of cognitive deficits with her greatest area performance only 15 or 20 percentile ranks difference between her best performance and worse performing areas of functioning.  Looking at subtest all subtest fell in the average range with the exception of one measure of visual reasoning and problem-solving while 2 or 3 other measures of visual reasoning and problem-solving were in the average range.  The patient had one measure of attention with a multi processing component in the mildly impaired range but 2 other measures of auditory encoding and multi processing abilities were in the average range and were in fact 1 of her better areas of performance.  As far as memory functioning the patient appears to be doing recently well with regard to both auditory and visual encoding abilities.  The patient's immediate memory for both  auditory and visual information as well as delayed memory for this information and ability to retain and over time were all consistent with predicted levels with no areas identified as being impaired.  Impression/Diagnosis:                     Overall, the results of the current neuropsychological evaluation are not consistent with any consistent or objective findings of neuropsychological deficits.  There was extreme consistency across subtest performance with almost every subtest falling in the average range and any item that which showed any mild difficulty had other subtests measuring similar things that were in the average range.  There were no indications of significant objective memory deficits.  However, the patient's reports of cognitive difficulties related to problems with memory and cognitive functioning do persist and the patient does have significant variables that could be playing a role with this.  The patient has significant and recurrent chronic migraines and the acute effects of chronic migraines on cognitive functioning due to pain and acute vascular changes can cause functional difficulties.  The patient's MRI does not show any signs of of residual neurological injury or damage from these migraines.  The patient also has significant and sustained stressors in her life that have had and will likely continue to have significant deleterious effect on her functioning and coping.  While I in no way question the validity of the patient's cognitive difficulties, I do think that the etiological factors of these are more related to functional causes including emotional distress, sleep disturbance and pain symptoms that while are significant and can have an  significant functional impact on the patient's cognitive functioning do not appear to be related to underlying brain dysfunction or brain injury.  There were no indications of objective findings of cognitive deficits or memory deficits.  I  will provide feedback to the patient regarding the results of the current neuropsychological evaluation.  I will also provide this information to her neurologist.  I do think that it will be important to address these very significant and important functional issues that are having such a deleterious effect on the patient's overall coping and life functioning.  Depression, anxiety and significant pain along with significant psychosocial variables all need to be addressed going forward.  Diagnosis:                               Axis I: Chronic migraine  Moderate episode of recurrent major depressive disorder ([place])  Persistent disorder of initiating or maintaining sleep  Memory loss   Ilean Skill, Psy.D. Neuropsychologist

## 2018-12-08 ENCOUNTER — Telehealth: Payer: Self-pay | Admitting: Neurology

## 2018-12-08 NOTE — Telephone Encounter (Signed)
I sent patient an email but we may need to call her about canceling appointment.  Neurocognitive testing with Dr. Chauncy Passy showed no objective findings of cognitive deficits or memory deficits. This means there is nothing wrong with her brain, she doesn't  have dementia or any other neurodegenerative disorder or underlying brain dysfunction or brain injury. Great news.   However Dr. Chauncy Passy did identify things that are impacting her such as emotional stress, sleep disturbance, depression and anxiety. I think these should be addressed with a therapist as they are out of my area of specialty.  Also identified were her migraines which I could help with however she is alrealdy seeing an excellent migraine doctor Claudie Fisherman. Please let he know I don;t think she needs to come back for follow up in March. I would suggest therapy and migraine management. I can also ask Lovena Le to call her thanks.  Thanks! Dr. Jaynee Eagles

## 2018-12-10 NOTE — Telephone Encounter (Signed)
Back to dr ahern 

## 2018-12-11 NOTE — Telephone Encounter (Signed)
I left a message to can cel appointment on her cell phone. I also sent her an email stating the same. Please cancel appointment no follow up needed here at neurology.

## 2018-12-11 NOTE — Telephone Encounter (Signed)
Appointment canceled.

## 2018-12-12 ENCOUNTER — Encounter: Payer: 59 | Admitting: Psychology

## 2019-01-05 ENCOUNTER — Telehealth: Payer: Self-pay | Admitting: Neurology

## 2019-01-05 NOTE — Telephone Encounter (Signed)
Pt has called to inform that she needed to see Dr Jaynee Eagles for a release for knee surgery.  Pt also wanted to discuss with Dr Jaynee Eagles the concern about her dizziness,  Pt is asking for a call, she says she is open to a virtual visit and would give consent for her insurance to be filed

## 2019-01-05 NOTE — Telephone Encounter (Signed)
Spoke with Dr. Jaynee Eagles. Will offer a video visit. I called pt & LVM offering video visit and asked for call back to discuss. Advised office closed however we will be open Monday. Left office number in message.

## 2019-01-08 ENCOUNTER — Ambulatory Visit: Payer: 59 | Admitting: Neurology

## 2019-01-08 NOTE — Telephone Encounter (Signed)
Pt's husband called in today. He didn't realize pt's appt had been canceled. Pt's 3/30 appt was canceled because neurology f/u was no longer needed. Discussed that Dr. Jaynee Eagles had sent mychart messages to the patient. Messages reviewed with Shanon Brow. Dr. Jaynee Eagles spoke with them and advised that she cannot clear pt for knee surgery and PCP would need to do that. Also advised them that pt see pcp first for the dizziness as this may not be neurologic.

## 2019-04-17 ENCOUNTER — Ambulatory Visit: Payer: 59 | Attending: Otolaryngology | Admitting: Physical Therapy

## 2019-04-17 ENCOUNTER — Encounter: Payer: Self-pay | Admitting: Physical Therapy

## 2019-04-17 ENCOUNTER — Other Ambulatory Visit: Payer: Self-pay

## 2019-04-17 DIAGNOSIS — R42 Dizziness and giddiness: Secondary | ICD-10-CM

## 2019-04-17 DIAGNOSIS — R296 Repeated falls: Secondary | ICD-10-CM

## 2019-04-17 DIAGNOSIS — R262 Difficulty in walking, not elsewhere classified: Secondary | ICD-10-CM | POA: Diagnosis present

## 2019-04-17 NOTE — Therapy (Signed)
[facility] [address] [place], [place], [address] Phone: [phone]   Fax:  [phone]  Physical Therapy Evaluation  Patient Details  Name: [name] MRN: [id number] Date of Birth: [birth year] Referring Provider (PT): Dr Carmin Richmond   Encounter Date: 04/17/2019  PT End of Session - 04/19/19 1614    Visit Number  1    Number of Visits  9    Date for PT Re-Evaluation  06/12/19    Authorization Type  1/10 progress note    PT Start Time  1001    PT Stop Time  1059    PT Time Calculation (min)  58 min    Equipment Utilized During Treatment  Gait belt    Activity Tolerance  Patient tolerated treatment well    Behavior During Therapy  WFL for tasks assessed/performed       Past Medical History:  Diagnosis Date  . Allergy   . Dizziness   . Gastroparesis    [facility]  . Migraine    [facility]  . PONV (postoperative nausea and vomiting)   . Weight decrease     Past Surgical History:  Procedure Laterality Date  . ABDOMINAL HYSTERECTOMY    . abdominalplasty     s/p weight loss  . BLOOD PATCH  2019  . brachiplasty     s/p weight loss   . CHOLECYSTECTOMY  09/04/2012   Procedure: LAPAROSCOPIC CHOLECYSTECTOMY;  Surgeon: Harl Bowie, MD;  Location: [facility];  Service: General;  Laterality: N/A;  . COLONOSCOPY  08/21/12   few scattered sigmoid diverticula: one sessile right colon polyp removed by cold biopsies x2:  patchy erythema in the left colon multiple random biopsies done:  normal therminal ileum  . COSMETIC SURGERY    . KNEE ARTHROSCOPY W/ ACL RECONSTRUCTION     in early 1990's  . LUMBAR PUNCTURE  2019  . TONSILLECTOMY     There were no vitals filed for this visit.   [facility] PT Assessment - 04/19/19 1610      Assessment   Medical Diagnosis  dizziness and giddiness    Referring Provider (PT)  Dr Loletha Grayer Vaught    Prior Therapy  no prior vestibular therapy      Precautions   Precautions  Fall       Restrictions   Weight Bearing Restrictions  No      [place] residence    Available Help at Discharge  Family;Friend(s)    Type of [place] to enter    Entrance Stairs-Number of Steps  2    Entrance Stairs-Rails  None    Home Layout  Two level    Alternate Level Stairs-Number of Steps  1 flight    Alternate Level Stairs-Rails  Right    Home Equipment  None      Prior Function   Level of Independence  Independent with community mobility without device      Cognition   Overall Cognitive Status  Within Functional Limits for tasks assessed      Standardized Balance Assessment   Standardized Balance Assessment  Dynamic Gait Index      Dynamic Gait Index   Level Surface  Mild Impairment    Change in Gait Speed  Severe Impairment    Gait with Horizontal Head Turns  Mild Impairment    Gait with Vertical Head  Turns  Mild Impairment    Gait and Pivot Turn  Normal    Step Over Obstacle  Mild Impairment    Step Around Obstacles  Normal    Steps  Moderate Impairment    Total Score  15       VESTIBULAR AND BALANCE EVALUATION   HISTORY:  Subjective history of current problem: Patient reports she has had dizziness for the past year with no precipitating event. Patient reports she has seen numerous specialist including two neurologists, neuropsychologist, cognitive behavioral therapist, ENT and PCP in regards to her symptoms. Patient reports she has had VNG testing, brain MRI in 2019, and a spinal tap where fluid was drawn out to relief pressure per patient report. Patient reports that the spinal tap was within the last 6 months. Patient reports her husband reported the patient's "balance was improved since drawing fluid off brain". Patient reports that she has another brain MRI scan ordered. Patient states that Dr. Manuella Ghazi has ordered a chest, hip and stomach CT, a Brain MRI with and without contrast, extensive blood work  and checking her copper levels. Patient states that she has been having tongue fasciculations and has severe gastroparesis issues and follows a special carbohydrate diet. Patient reports she veers when she walks and at times will fall into the wall when walking. Patient uses shopping cart for support when out in stores. Patient states she has difficulty when she has to look up and down from her computer screen. Patient reports heading turning brings on her symptoms, but that vertical head turns are worse than horizontal head turns. Patient reports when she would drive she felt she had difficulty with her peripheral vision. Patient states "it felt like the vehicles were coming in on her". She is currently not driving due to her symptoms. Patient reports depth perception difficulties especially with descending steps. Patient reports when she lies down she gets more of the vertigo feeling. Patient reports that she is afraid of falling. Patient states she does not think that a cane would help her and that she does not want to use a RW. Patient reports that she saw Dr. Pryor Ochoa, ENT physician in regards to her symptoms. Patient reports she did have BPPV in her right ear and she had treatment in the ENT office. Patient reports the canal was  clear on the follow-up appointment. Patient reports that her symptoms of dizziness have improved some since treatment for BPPV. Patient states she is now getting episodes of dizziness 1-2 times a day that last varying amounts. Patient reports that sometimes her dizziness lasts 3-5 minutes, sometimes 10 minutes and other days the symptoms can be constant for 30 minutes and then continue to come and go throughout the day. Patient reports that she had VNG testing and that it showed nerve damage in both ears and she was told that she will need hearing aides in the next ten years. Patient reports that Dr. Pryor Ochoa felt that her symptoms were most consistent with CNS and vestibular migraine  issues. Patient reports she has been having migraines since she was 53 years old. Patient reports her migraines have gotten much worse in the past year with increased intensity. Patient states she follows the migraine diet and avoids cheese, wine, nuts, alcohol, etc. Patient reports she feels like a migraine is "coming on" today. Patient states she gets Botox injections and takes Emgality monthly for Migraines. Patient states she tends to get more migraines towards the end of the time period before  her next Botox injection is due. Patient states she was supposed to get Botox last week but the medication will not get in until July 7th and then the doctors office will call to schedule her to come in for the injection. Patient reports she is currently getting migraines every other day. Patient reports her memory for time is impaired and she is unable to relate some details of her history. In addition, patient's history is tangential at times. Patient states she was having difficulty counting cash back, talking and mixing up her words, trying to write checks as she would write the wrong amounts. Patient reports she was having difficulty remembering how to sign on the computer at work. Patient reports she only goes in 1-3 times a week to work now due to these cognitive issues. Patient is seeing a cognitive behavioral therapist. Patient reports she needs a left knee replacement, but she is holding off on surgery due to her dizziness problems. She is going to get a cortisone shot in the knee. Patient reports she is moving into a two story home on August 1st. Patient reports there is a bedroom on the first floor. Patient has 2 children.   Per Dr. Sima Matas (Psychologist) office note 12/04/18: Overall, the results of the current neuropsychological evaluation are not consistent with any consistent or objective findings of neuropsychological deficits. There was extreme consistency across subtest performance with almost every  subtest falling in the average range and any item that which showed any mild difficulty had other subtestsmeasuring similar things that were in the average range. There were no indications of significant objective memory deficits. However, the patient's reports of cognitive difficulties related to problems with memory and cognitive functioning do persist and the patient does have significant variables that could be playing a role with this. The patient has significant and recurrent chronic migraines and the acute effects of chronic migraines on cognitive functioning due to pain and acute vascular changes can cause functional difficulties. The patient's MRI does not show any signs of of residual neurological injury or damage from these migraines. The patient also has significant and sustained stressors in her life that have had andwill likely continue to have significant deleterious effect on her functioning and coping.  Per Dr. Trena Platt (Neurologist) office note 04/11/19:  Concern for multiple cranial neuropathies - in a patient with symptoms of dizziness + sensorineural hearing loss + tongue fasciculations + cognitive impairment + migraine headaches - cannot rule out Vestibular migraines but this would not explain patient's tongue fasciculations. Patient has had extensive labs including ANCA profile, Rheumatoid Arthritis Factor, RPR, Hep pane, HIV, Folate, LYME, Vitamin B12, Thiamine, Vitamin B6, ESR, TSH, heavy metals screen which were unremarkable. Patient has had MRI of the brain without contrast.  - We will order MRI brain with and without contrast  - We will order CT chest, abdomen, pelvis for significant night sweats  - We will order labs to check TPO antibody, Copper - We will order EMG genioglossus muscle and upper extremities to evaluate tongue fasciculations  - We will order Paraneoplastic panel through [facility] - [address] Paraneoplastic R Evaluation with Recombx Hu, Yo, Zic4, CV2,  MaTa, Ri, CAR, VGCC, VGKC, Amphiphysin, gnAChR, NMDA, GAD65, LGI1, CASPR2.  - Discussed medication Diamox with patient  Acetazolamide / Diamox reduced CSF production.   Description of dizziness: vertigo, unsteadiness, falling, "motion movement"- feels things around her are moving Frequency: better than it was; states now it is 1-2 times a day and some  days constant for 30 minutes and then it will come and go throughout the day Duration:3-5 minutes sometimes 10 minutes; later patient reports that some days constant symptoms for 30 minutes and then it will come and go throughout the day  Symptom nature: motion provoked, positional, spontaneous, variable, intermittent   Provocative Factors: vertical head turns, stepping down off stairs, stepping out of water with the water moving felt she was trying to step over the water Easing Factors: holding railings  Progression of symptoms: better History of similar episodes: none  Falls (yes/no): yes Number of falls in past 6 months: 2 falling into something and then falling to the ground and one fall to the ground for a total of 3 falls. Multiple near miss falls where she reaches for support  Prior Functional Level: Patient was working full time, driving and independent community ambulator without AD prior to onset of her symptoms.   Auditory complaints (tinnitus, pain, drainage): pain in right ear and mild tinnitus; ENT MD said he saw nothing and the ear looked great. Vision (last eye exam, diplopia, recent changes): blurred vision tried changing her contacts and still having blurring; she has an appt to see eye doctor at the end of month.  Reports she gets the blurring randomly independent of moving or keeping her head still. Reports when she lies down sometimes she feels lightheaded.   Current Symptoms: (dysarthria, dysphagia, drop attacks, bowel and bladder changes, recent weight loss/gain)    Patient reports she has had a greater than 10 pound  weight gain.    EXAMINATION  SOMATOSENSORY:  Any N & T in extremities or weakness: patient reports tingling in left thumb and index finger, but states she has not had any tingling in the past 2 months.       COORDINATION: Finger to Nose:  Dysmetric left Past Pointing:   Left   Pronator Drift: left arm drops slightly and patient with left lateral trunk leansing with testing  MUSCULOSKELETAL SCREEN: Cervical Spine ROM: AROM cervical ROM WFL flexion, extension, right and left rotation with discomfort at end range of rotation and flexion. Patient reports this movement got her off balance and she felt pressure in her ears.  Gait: Patient arrives ambulating without AD. Patient ambulates with slow antalgic gait pattern (discomfort left knee) with fair scanning of visual environment.  Balance: Patient is challenged by single leg stance, narrow base of support, uneven surfaces, ambulation with head turns and body turns and eyes closed activities.  POSTURAL CONTROL TESTS:  Clinical Test of Sensory Interaction for Balance    (CTSIB):  CONDITION TIME STRATEGY SWAY  Eyes open, firm surface 30 seconds ankle +1  Eyes closed, firm surface 30 seconds ankle +2  Eyes open, foam surface 30 seconds ankle +2  Eyes closed, foam surface 5 seconds ankle +4    OCULOMOTOR / VESTIBULAR TESTING:  Oculomotor Exam- Room Light  Normal Abnormal Comments  Ocular Alignment N    Ocular ROM N    Spontaneous Nystagmus N    Gaze evoked Nystagmus N    Smooth Pursuit N  Reported dizziness  Saccades  Abn Hypometric saccades noted in all fields  VOR N    VOR Cancellation  Abn Feels the room is moving  Left Head Impulse     Right Head Impulse  Abn Corrective saccades noted with right   Head Shaking Nystagmus   Deferred secondary to patient reporting increased dizziness with vestibular testing and neck discomfort-states she has a "crick" in  her neck prior to coming in for therapy  Static Acuity     Dynamic Acuity    Deferred secondary to patient reporting increased dizziness with vestibular testing and neck discomfort-states she has a "crick" in her neck prior to coming in for therapy    BPPV TESTS:  Will plan on testing next session secondary to time constraints this date.    FUNCTIONAL OUTCOME MEASURES:  Results Comments  DHI  64 /100 Moderate perception of handicap; in need of intervention  ABC Scale       25% Falls risk; in need of intervention  DGI       15 /24 Falls risk; in need of intervention     PT Education - 04/18/19 1435    Education Details  discussed plan of care    Person(s) Educated  Patient    Methods  Explanation    Comprehension  Verbalized understanding       PT Short Term Goals - 04/18/19 1431      PT SHORT TERM GOAL #1   Title  Patient will be independent with home exercise program for self management.    Time  4    Period  Weeks    Status  New    Target Date  05/15/19        PT Long Term Goals - 04/19/19 1625      PT LONG TERM GOAL #1   Title  Patient will score 19/24 or greater on the DGI to demonstrate reduced falls risk.    Baseline  scored 15/24 on 04/17/19    Time  8    Period  Weeks    Status  New    Target Date  06/12/19      PT LONG TERM GOAL #2   Title  Patient will score low perception of handicap which is a score of 39/100 or less on the [facility] in order to demonstrate improvement in patient's overall symptoms of dizziness in her daily life.    Baseline  scored 64/100 on 04/17/19    Time  8    Period  Weeks    Status  New    Target Date  06/12/19      PT LONG TERM GOAL #3   Title  Patient will demonstrate improved balance and decresaed falls risk as evidenced by a score of 67% or greater on the ABC scale in order to help decrease patient's falls risk.    Baseline  scored 25% on 04/17/19    Time  8    Period  Weeks    Status  New    Target Date  06/12/19      PT LONG TERM GOAL #4   Title  Patient will report 50% or greater improvement in her  symptoms of dizziness and imbalance with provoking motions or positions in order to be able to resume some of her prior activities such as working.    Time  8    Period  Weeks    Status  New    Target Date  06/12/19             Plan - 04/19/19 1615    Clinical Impression Statement  Patient with complex history and subjective report of symptoms. Patient with history of migraine headaches that have worsened in intensity this past year per patient report. Patient with potential indicators of peripheral and central findings with vestibular testing. Patient has balance deficitis and is at risk for falls.  Will plan on performing canal testing next visit and treat if indicated. Patient would benefit from PT services to address goals as set on plan of care, to try to reduce patient's subjective symptoms and to try to reduce patient's falls risk.    Personal Factors and Comorbidities  Comorbidity 2    Comorbidities  migraine headaches, dizziness    Examination-Activity Limitations  Locomotion Level;Stairs    Examination-Participation Restrictions  Driving;Personal Finances;Other   working   Clinical Decision Making  Moderate    Rehab Potential  Fair    PT Frequency  1x / week    PT Duration  8 weeks    PT Treatment/Interventions  Canalith Repostioning;Gait training;Stair training;Therapeutic exercise;Balance training;Therapeutic activities;Neuromuscular re-education;Patient/family education;Vestibular;ADLs/Self Care Home Management    Consulted and Agree with Plan of Care  Patient       Patient will benefit from skilled therapeutic intervention in order to improve the following deficits and impairments:     Visit Diagnosis: 1. Dizziness and giddiness   2. Repeated falls   3. Difficulty in walking, not elsewhere classified        Problem List Patient Active Problem List   Diagnosis Date Noted  . Memory loss 06/12/2018  . Biliary dyskinesia 08/28/2012  . Tobacco use 07/02/2012  .  Migraine headache 07/02/2012   Lady Deutscher PT, DPT [phone] Lady Deutscher 04/18/2019, 2:38 PM  [facility] [address] [place], [place], [address] Phone: [phone]   Fax:  [phone]  Name: [name] MRN: [id number] Date of Birth: [birth year]

## 2019-04-27 ENCOUNTER — Ambulatory Visit: Payer: 59

## 2019-04-27 ENCOUNTER — Other Ambulatory Visit: Payer: Self-pay

## 2019-04-27 DIAGNOSIS — R42 Dizziness and giddiness: Secondary | ICD-10-CM | POA: Diagnosis not present

## 2019-04-27 DIAGNOSIS — R262 Difficulty in walking, not elsewhere classified: Secondary | ICD-10-CM

## 2019-04-27 NOTE — Therapy (Signed)
[place] MAIN [facility] SERVICES [address] [place], [place], [address] Phone: [phone]   Fax:  [phone]  Physical Therapy Treatment  Patient Details  Name: [name] MRN: [id number] Date of Birth: 01-31-1966 Referring Provider (PT): Dr Carmin Richmond   Encounter Date: 04/27/2019  PT End of Session - 04/27/19 1207    Visit Number  2    Number of Visits  9    Date for PT Re-Evaluation  06/12/19    Authorization Type  eval 04/19/19    PT Start Time  1100    PT Stop Time  1145    PT Time Calculation (min)  45 min    Equipment Utilized During Treatment  Gait belt    Activity Tolerance  Patient tolerated treatment well    Behavior During Therapy  [facility] for tasks assessed/performed       Past Medical History:  Diagnosis Date  . Allergy   . Dizziness   . Gastroparesis    [facility]  . Migraine    [facility]  . PONV (postoperative nausea and vomiting)   . Weight decrease     Past Surgical History:  Procedure Laterality Date  . ABDOMINAL HYSTERECTOMY    . abdominalplasty     s/p weight loss  . BLOOD PATCH  2019  . brachiplasty     s/p weight loss   . CHOLECYSTECTOMY  09/04/2012   Procedure: LAPAROSCOPIC CHOLECYSTECTOMY;  Surgeon: Harl Bowie, MD;  Location: [place];  Service: General;  Laterality: N/A;  . COLONOSCOPY  08/21/12   few scattered sigmoid diverticula: one sessile right colon polyp removed by cold biopsies x2:  patchy erythema in the left colon multiple random biopsies done:  normal therminal ileum  . COSMETIC SURGERY    . KNEE ARTHROSCOPY W/ ACL RECONSTRUCTION     in early 1990's  . LUMBAR PUNCTURE  2019  . TONSILLECTOMY      There were no vitals filed for this visit.  Subjective Assessment - 04/27/19 1204    Subjective  Pt reports that she has not had any episodes of vertigo since last visit.  She states that she feels that her dizziness is about 75% better since canalith repositioning 3 weeks  ago at ENT office. She believes that her balance is mainly affected by her arthritic L knee.  She has no new questions or concerns at this point.    Pertinent History  Patient reports she has had dizziness for the past year with no precipitating event. Patient reports she has seen numerous specialist including two neurologists, neuropsychologist, cognitive behavioral therapist, ENT and PCP in regards to her symptoms. Patient reports she has had VNG testing, brain MRI in 2019, and a spinal tap where fluid was drawn out to relief pressure per patient report. Patient reports that the spinal tap was within the last 6 months. Patient reports her husband reported the patient's "balance was improved since drawing fluid off brain". Patient reports that she has another brain MRI scan ordered. Patient states that Dr. Manuella Ghazi has ordered a chest, hip and stomach CT, a Brain MRI with and without contrast, extensive blood work and checking her copper levels. Patient states that she has been having tongue fasciculations and has severe gastroparesis issues and follows a special carbohydrate diet. Patient reports she veers when she walks and at times will fall into the wall when walking. Patient uses shopping cart for support when out in stores. Patient states  she has difficulty when she has to look up and down from her computer screen. Patient reports heading turning brings on her symptoms, but that vertical head turns are worse than horizontal head turns. Patient reports when she would drive she felt she had difficulty with her peripheral vision. Patient states "it felt like the vehicles were coming in on her". She is currently not driving due to her symptoms. Patient reports depth perception difficulties especially with descending steps. Patient reports when she lies down she gets more of the vertigo feeling. Patient reports that she is afraid of falling. Patient states she does not think that a cane would help her and that she  does not want to use a RW. Patient reports that she saw Dr. Pryor Ochoa, ENT physician in regards to her symptoms. Patient reports she did have BPPV in her right ear and she had treatment in the ENT office. Patient reports the canal was  clear on the follow-up appointment. Patient reports that her symptoms of dizziness have improved some since treatment for BPPV. Patient states she is now getting episodes of dizziness 1-2 times a day that last varying amounts. Patient reports that sometimes her dizziness lasts 3-5 minutes, sometimes 10 minutes and other days the symptoms can be constant for 30 minutes and then continue to come and go throughout the day.    Patient Stated Goals  patient would like to have decreased symptoms and be able to drive and work    Currently in Pain?  No/denies        TREATMENT   Ther-ex (-) Dix Hallpike bilaterally; held in each position 30-45 seconds.  No symptoms or nystagmus elicited. (-) Roll Test bilaterally; held in each position 30-45 seconds.  No symptoms or nystagmus elicited. Patient education handout provided to explain BPPV; Extensive education with patient regarding different factors related to dizziness and balance; Seated VOR x 1 horizontal, 60 seconds; no symptoms elicited Seated VOR x 1 vertical, 60 seconds; no symptoms elicited Standing with feet apart VOR x 1 horizontal, plain background, x 60 seconds; swaying noted throughout, no dizziness reported. Semitandem stance x 30 seconds 1x with L in front of R and 1x with R in front of L, pt with swaying and intermittent LOB.      Pt educated throughout session about proper posture and technique with exercises. Improved exercise technique, movement at target joints, use of target muscles after min to mod verbal, visual, tactile cues.     Pt displays good motivation throughout today's session.  BPPV testing reveals negative Dix Hallpike and Roll test bilaterally, with no symptoms or nystagmus noted.  Extensive  patient education provided about BPPV.  She demonstrates mild swaying with standing VOR x 1 horizontal as well as semitandem balance with head turns.  HEP initiated today.  Pt attributes her unsteadiness with gait to her L arthritic knee. Discussed possible therapy for L knee once dizziness has fully resolved. Pt will benefit from skilled PT services to address dizziness and unsteadiness in order to improve her QOL.            PT Short Term Goals - 04/18/19 1431      PT SHORT TERM GOAL #1   Title  Patient will be independent with home exercise program for self management.    Time  4    Period  Weeks    Status  New    Target Date  05/15/19        PT Long Term Goals -  04/19/19 1625      PT LONG TERM GOAL #1   Title  Patient will score 19/24 or greater on the DGI to demonstrate reduced falls risk.    Baseline  scored 15/24 on 04/17/19    Time  8    Period  Weeks    Status  New    Target Date  06/12/19      PT LONG TERM GOAL #2   Title  Patient will score low perception of handicap which is a score of 39/100 or less on the [facility] in order to demonstrate improvement in patient's overall symptoms of dizziness in her daily life.    Baseline  scored 64/100 on 04/17/19    Time  8    Period  Weeks    Status  New    Target Date  06/12/19      PT LONG TERM GOAL #3   Title  Patient will demonstrate improved balance and decresaed falls risk as evidenced by a score of 67% or greater on the ABC scale in order to help decrease patient's falls risk.    Baseline  scored 25% on 04/17/19    Time  8    Period  Weeks    Status  New    Target Date  06/12/19      PT LONG TERM GOAL #4   Title  Patient will report 50% or greater improvement in her symptoms of dizziness and imbalance with provoking motions or positions in order to be able to resume some of her prior activities such as working.    Time  8    Period  Weeks    Status  New    Target Date  06/12/19            Plan - 04/27/19 1240     Clinical Impression Statement  Pt displays good motivation throughout today's session.  Canalith repositioning testing assessed with a negative Dix Hallpike and Roll test bilaterally, with no symptoms or nystagmus noted.  Extensive patient education provided about BPPV.  She demonstrates mild swaying with standing horizontal gaze stabilization exercises and standing VOR head turns with head and eyes moving together.  HEP provided with standing horizontal gaze stabilization and standing VOR.  Pt attributes her unsteadiness with gait to her L arthritic knee.  Pt will benefit from skilled PT services to address dizziness and unsteadiness in order to improve her QOL.    Personal Factors and Comorbidities  Comorbidity 2    Comorbidities  migraine headaches, dizziness    Examination-Activity Limitations  Locomotion Level;Stairs    Examination-Participation Restrictions  Driving;Personal Finances;Other   working   Rehab Potential  Fair    PT Frequency  1x / week    PT Duration  8 weeks    PT Treatment/Interventions  Canalith Repostioning;Gait Scientist, forensic;Therapeutic exercise;Balance training;Therapeutic activities;Neuromuscular re-education;Patient/family education;Vestibular;ADLs/Self Care Home Management    PT Next Visit Plan  Review and progress gaze stabilization and habituation exercises as appropriate    PT Home Exercise Plan  VOR x 1 horizontal in standing with feet apart, semitandem horizontal head turns    Consulted and Agree with Plan of Care  Patient       Patient will benefit from skilled therapeutic intervention in order to improve the following deficits and impairments:  Decreased balance, Decreased coordination, Decreased mobility, Dizziness, Difficulty walking  Visit Diagnosis: 1. Dizziness and giddiness   2. Difficulty in walking, not elsewhere classified  Problem List Patient Active Problem List   Diagnosis Date Noted  . Memory loss 06/12/2018  . Biliary  dyskinesia 08/28/2012  . Tobacco use 07/02/2012  . Migraine headache 07/02/2012    This entire session was performed under direct supervision and direction of a licensed therapist/therapist assistant . I have personally read, edited and approve of the note as written.    Lutricia Horsfall, SPT [facility] PT, DPT, GCS  [name],[name] 04/27/2019, 1:23 PM  [place] MAIN [facility] SERVICES [address] [place], [place], [address] Phone: [phone]   Fax:  [phone]  Name: Betty RANDOL MRN: [id number] Date of Birth: 1966-01-06

## 2019-04-27 NOTE — Patient Instructions (Signed)
Access Code: [email or web address]  URL: [email or web address]  Date: 04/27/2019  Prepared by: Roxana Hires   Exercises Standing Gaze Stabilization with Head Rotation - 3 reps - 60 seconds hold - 4x daily - 7x weekly Tandem Stance with Head Rotation - 3 reps - 30 seconds hold - 4x daily - 7x weekly

## 2019-05-01 ENCOUNTER — Other Ambulatory Visit: Payer: Self-pay | Admitting: Neurology

## 2019-05-01 DIAGNOSIS — G527 Disorders of multiple cranial nerves: Secondary | ICD-10-CM

## 2019-05-01 DIAGNOSIS — K3184 Gastroparesis: Secondary | ICD-10-CM

## 2019-05-04 ENCOUNTER — Other Ambulatory Visit: Payer: Self-pay

## 2019-05-04 ENCOUNTER — Ambulatory Visit: Payer: 59

## 2019-05-04 DIAGNOSIS — R262 Difficulty in walking, not elsewhere classified: Secondary | ICD-10-CM

## 2019-05-04 DIAGNOSIS — R42 Dizziness and giddiness: Secondary | ICD-10-CM | POA: Diagnosis not present

## 2019-05-04 NOTE — Therapy (Signed)
[place] MAIN [facility] SERVICES [place], [place], [address] Phone: [phone]   Fax:  [phone]  Physical Therapy Treatment/Discharge  Patient Details  Name: [name] MRN: [id number] Date of Birth: 27-Nov-1965 Referring Provider (PT): Dr Carmin Richmond   Encounter Date: 05/04/2019  PT End of Session - 05/04/19 1101    Visit Number  3    Number of Visits  9    Date for PT Re-Evaluation  06/12/19    Authorization Type  eval 04/19/19    PT Start Time  1045    PT Stop Time  1115    PT Time Calculation (min)  30 min    Equipment Utilized During Treatment  Gait belt    Activity Tolerance  Patient tolerated treatment well    Behavior During Therapy  [facility] for tasks assessed/performed       Past Medical History:  Diagnosis Date  . Allergy   . Dizziness   . Gastroparesis    [facility]  . Migraine    [facility]  . PONV (postoperative nausea and vomiting)   . Weight decrease     Past Surgical History:  Procedure Laterality Date  . ABDOMINAL HYSTERECTOMY    . abdominalplasty     s/p weight loss  . BLOOD PATCH  2019  . brachiplasty     s/p weight loss   . CHOLECYSTECTOMY  09/04/2012   Procedure: LAPAROSCOPIC CHOLECYSTECTOMY;  Surgeon: Harl Bowie, MD;  Location: [place];  Service: General;  Laterality: N/A;  . COLONOSCOPY  08/21/12   few scattered sigmoid diverticula: one sessile right colon polyp removed by cold biopsies x2:  patchy erythema in the left colon multiple random biopsies done:  normal therminal ileum  . COSMETIC SURGERY    . KNEE ARTHROSCOPY W/ ACL RECONSTRUCTION     in early 1990's  . LUMBAR PUNCTURE  2019  . TONSILLECTOMY      There were no vitals filed for this visit.  Subjective Assessment - 05/04/19 1045    Subjective  Pt reports that she has not had any episodes of vertigo since last visit.  She states that she did experience a LOB 2x last week into the wall and states that her  headaches were bad that day.  She states that she feels that her dizziness is about 90% better since canalith repositioning 4 weeks ago at ENT office. She believes that her balance is mainly affected by her arthritic L knee.  She has no new questions or concerns at this point.    Pertinent History  Patient reports she has had dizziness for the past year with no precipitating event. Patient reports she has seen numerous specialist including two neurologists, neuropsychologist, cognitive behavioral therapist, ENT and PCP in regards to her symptoms. Patient reports she has had VNG testing, brain MRI in 2019, and a spinal tap where fluid was drawn out to relief pressure per patient report. Patient reports that the spinal tap was within the last 6 months. Patient reports her husband reported the patient's "balance was improved since drawing fluid off brain". Patient reports that she has another brain MRI scan ordered. Patient states that Dr. Manuella Ghazi has ordered a chest, hip and stomach CT, a Brain MRI with and without contrast, extensive blood work and checking her copper levels. Patient states that she has been having tongue fasciculations and has severe gastroparesis issues and follows a special carbohydrate diet. Patient reports she veers when  she walks and at times will fall into the wall when walking. Patient uses shopping cart for support when out in stores. Patient states she has difficulty when she has to look up and down from her computer screen. Patient reports heading turning brings on her symptoms, but that vertical head turns are worse than horizontal head turns. Patient reports when she would drive she felt she had difficulty with her peripheral vision. Patient states "it felt like the vehicles were coming in on her". She is currently not driving due to her symptoms. Patient reports depth perception difficulties especially with descending steps. Patient reports when she lies down she gets more of the vertigo  feeling. Patient reports that she is afraid of falling. Patient states she does not think that a cane would help her and that she does not want to use a RW. Patient reports that she saw Dr. Pryor Ochoa, ENT physician in regards to her symptoms. Patient reports she did have BPPV in her right ear and she had treatment in the ENT office. Patient reports the canal was  clear on the follow-up appointment. Patient reports that her symptoms of dizziness have improved some since treatment for BPPV. Patient states she is now getting episodes of dizziness 1-2 times a day that last varying amounts. Patient reports that sometimes her dizziness lasts 3-5 minutes, sometimes 10 minutes and other days the symptoms can be constant for 30 minutes and then continue to come and go throughout the day.    Patient Stated Goals  patient would like to have decreased symptoms and be able to drive and work        Treatment: Marye Round negative bilaterally Roll Test negative bilaterally DGI - 22/24 Instructed on home Eply Maneuver and how to progress standing VORx1 exercises    Pt displays excellent motivation throughout today's session.  At this time, she has met all of her short and long term goals.  She reports that her symptoms are 90% resolved and that she has not had any episodes since canalith repositioning 4 weeks ago.  She has improved on her ABC from a 25% to a 95%, improved on her [place] from a 64/100 to a 4/100, and improved on the DGI from a 15/24 to a 22/24.  She demonstrates independence with her HEP, and has been given instruction on how to progress exercises as needed.  Pt advised on how to perform the home Eply Maneuver if symptoms were to reoccur at a later date.  Both the [employer] and [employer] were reassessed and negative bilaterally.  She was instructed that if symptoms reappear to contact ENT for a new order.  Pt expresses no concerns or questions at this time, and is in agreement that she is ready to  discharge at this time.            PT Short Term Goals - 05/04/19 1120      PT SHORT TERM GOAL #1   Title  Patient will be independent with home exercise program for self management.    Time  4    Period  Weeks    Status  Achieved    Target Date  05/15/19        PT Long Term Goals - 05/04/19 1100      PT LONG TERM GOAL #1   Title  Patient will score 19/24 or greater on the DGI to demonstrate reduced falls risk.    Baseline  scored 15/24 on 04/17/19; scored a  22/24 on 05/04/19    Time  8    Period  Weeks    Status  Achieved      PT LONG TERM GOAL #2   Title  Patient will score low perception of handicap which is a score of 39/100 or less on the [facility] in order to demonstrate improvement in patient's overall symptoms of dizziness in her daily life.    Baseline  scored 64/100 on 04/17/19; 4/100 on 05/04/19    Time  8    Period  Weeks    Status  Achieved      PT LONG TERM GOAL #3   Title  Patient will demonstrate improved balance and decresaed falls risk as evidenced by a score of 67% or greater on the ABC scale in order to help decrease patient's falls risk.    Baseline  scored 25% on 04/17/19; scored a 95% on 05/04/19    Time  8    Period  Weeks    Status  Achieved      PT LONG TERM GOAL #4   Title  Patient will report 50% or greater improvement in her symptoms of dizziness and imbalance with provoking motions or positions in order to be able to resume some of her prior activities such as working.    Baseline  05/04/19: reports 90% resolution    Time  8    Period  Weeks    Status  Achieved            Plan - 05/04/19 1127    Clinical Impression Statement  Pt displays excellent motivation throughout today's session.  At this time, she has met all of her short and long term goals.  She reports that her symptoms are 90% resolved and that she has not had any episodes since canalith repositioning 4 weeks ago.  She has improved on her ABC from a 25% to a 95%, improved on her [place] from  a 64/100 to a 4/100, and improved on the DGI from a 15/24 to a 22/24.  She demonstrates independence with her HEP, and has been given instruction on how to progress exercises as needed.  Pt advised on how to perform the home Eply Maneuver if symptoms were to reoccur at a later date.  Both the [employer] and [employer] were reassessed and negative bilaterally.  She was instructed that if symptoms reappear to contact ENT for a new order.  Pt expresses no concerns or questions at this time, and is in agreement that she is ready to discharge at this time.    Personal Factors and Comorbidities  Comorbidity 2    Comorbidities  migraine headaches, dizziness    Examination-Activity Limitations  Locomotion Level;Stairs    Examination-Participation Restrictions  Driving;Personal Finances;Other   working   Rehab Potential  Fair    PT Frequency  1x / week    PT Duration  8 weeks    PT Treatment/Interventions  Canalith Repostioning;Gait Scientist, forensic;Therapeutic exercise;Balance training;Therapeutic activities;Neuromuscular re-education;Patient/family education;Vestibular;ADLs/Self Care Home Management    PT Next Visit Plan  Discharge    PT Home Exercise Plan  VOR x 1 horizontal in standing with feet apart, semitandem horizontal head turns    Consulted and Agree with Plan of Care  Patient       Patient will benefit from skilled therapeutic intervention in order to improve the following deficits and impairments:  Decreased balance, Decreased coordination, Decreased mobility, Dizziness, Difficulty walking  Visit Diagnosis: 1. Dizziness and giddiness  2. Difficulty in walking, not elsewhere classified        Problem List Patient Active Problem List   Diagnosis Date Noted  . Memory loss 06/12/2018  . Biliary dyskinesia 08/28/2012  . Tobacco use 07/02/2012  . Migraine headache 07/02/2012    This entire session was performed under direct supervision and direction of a licensed  therapist/therapist assistant . I have personally read, edited and approve of the note as written.   Lutricia Horsfall, SPT [facility] PT, DPT, GCS  Huprich,Jason 05/04/2019, 11:35 AM  [place] MAIN [facility] SERVICES [address] [place], [place], [address] Phone: [phone]   Fax:  [phone]  Name: [name] MRN: [id number] Date of Birth: May 03, 1966

## 2019-05-11 ENCOUNTER — Ambulatory Visit: Payer: 59

## 2019-05-18 ENCOUNTER — Ambulatory Visit: Payer: 59

## 2019-05-22 ENCOUNTER — Ambulatory Visit
Admission: RE | Admit: 2019-05-22 | Discharge: 2019-05-22 | Disposition: A | Discharge status: Home / Self Care | Payer: 59 | Source: Ambulatory Visit | Attending: Neurology | Admitting: Neurology

## 2019-05-22 ENCOUNTER — Other Ambulatory Visit: Payer: Self-pay

## 2019-05-22 DIAGNOSIS — K3184 Gastroparesis: Secondary | ICD-10-CM | POA: Diagnosis present

## 2019-05-22 DIAGNOSIS — G527 Disorders of multiple cranial nerves: Secondary | ICD-10-CM

## 2019-05-22 DIAGNOSIS — G529 Cranial nerve disorder, unspecified: Secondary | ICD-10-CM | POA: Diagnosis not present

## 2019-05-22 MED ORDER — IOHEXOL 300 MG/ML  SOLN
100.0000 mL | Freq: Once | INTRAMUSCULAR | Status: AC | PRN
  Administered 2019-05-22: 100 mL via INTRAVENOUS

## 2019-05-22 MED ORDER — GADOBUTROL 1 MMOL/ML IV SOLN
10.0000 mL | Freq: Once | INTRAVENOUS | Status: AC | PRN
  Administered 2019-05-22: 10:00:00 10 mL via INTRAVENOUS

## 2019-05-25 ENCOUNTER — Ambulatory Visit: Payer: 59

## 2019-06-01 ENCOUNTER — Ambulatory Visit: Payer: 59

## 2019-07-02 ENCOUNTER — Other Ambulatory Visit: Payer: Self-pay

## 2019-07-02 ENCOUNTER — Ambulatory Visit (INDEPENDENT_AMBULATORY_CARE_PROVIDER_SITE_OTHER): Payer: 59 | Admitting: Family Medicine

## 2019-07-02 ENCOUNTER — Encounter: Payer: Self-pay | Admitting: Family Medicine

## 2019-07-02 DIAGNOSIS — R413 Other amnesia: Secondary | ICD-10-CM

## 2019-07-02 NOTE — Patient Instructions (Signed)
It was very nice to meet you!  I will be in touch with you soon after I review all of your records.

## 2019-07-06 ENCOUNTER — Encounter: Payer: Self-pay | Admitting: Family Medicine

## 2019-07-06 NOTE — Progress Notes (Signed)
[name] - 53 y.o. female MRN [id number]  Date of birth: [birth year]  Subjective Chief Complaint  Patient presents with  . Establish Care    est care/ denies flu shot     HPI [name] is a 53 y.o. female with history of cerebral atrophy and associated memory loss, gastroparesis, and migraines here today for initial visit with new pcp.  She has interesting history regarding her memory loss.  She reports that she began having memory problems a few years ago.  Had MRI in 2017 that was normal with follow up MRI of this year showing generalized cerebral atrophy with hippocampal atrophy. She has seen several neurologists for this.  Initially followed by Dr. Jaynee Eagles in [place] she had extensive work up including infectious and auto-immune work up.  She was subsequently seen by Dr. Manuella Ghazi at [facility] who recommended that she see Dr. Oleta Mouse for further evaluation of her symptoms.  Her last visit with Dr. Oleta Mouse was on 9/15.  She reports that he recommended a repeat lumbar puncture to "check for proteins to check for early onset alzheimers."  She is very hesitant to do this due to her prior experience with lumbar puncture and need for a blood patch.  She feels like there is something more causing her symptoms given her unexplained gastroparesis as well.  She also thinks she may have a vitamin deficiency.  Testing so far has not uncovered any deficiencies. Her psychiatrist has recently re-ordered her b12 and folate levels.  She is also followed by a separate neurologist at [facility] for management of migraines.    ROS:  A comprehensive ROS was completed and negative except as noted per HPI  Allergies  Allergen Reactions  . Codeine Nausea And Vomiting and Other (See Comments)    Severe Vomiting for days.  All forms of codeine, including synthetic.  Pt can take hydrocodone with food.  Update 08/15/18 pt now reports she does ok with the medication, just need some food. If taking more than a few days, she gets a  headache. Severe Vomiting for days.  All forms of codeine, including synthetic.  Pt can take hydrocodone with food. Severe Vomiting for days.  All forms of codeine, including synthetic.  Pt can take hydrocodone with food.  Update 08/15/18 pt now reports she does ok with the medication, just need some food. If taking more than a few days, she gets a headache. Severe Vomiting for days.  All forms of codeine, including synthetic.  Pt can take hydrocodone with food. Severe Vomiting for days.  All forms of codeine, including synthetic.  Pt can take hydrocodone with food.  Update 08/15/18 pt now reports she does ok with the medication, just need some food. If taking more than a few days, she gets a headache. Severe Vomiting for days.  All forms of codeine, including synthetic.  Pt can take hydrocodone with food.  Update 08/15/18 pt now reports she does ok with the medication, just need some food. If taking more than a few days, she gets a headache.   . Ondansetron Palpitations    Reaction to tablets - ok with I.V. Denies allergy Denies allergy Denies allergy Denies allergy Reaction to tablets - ok with I.V.   . Oxycodone-Acetaminophen Nausea And Vomiting    ANYTHING WITH CODEINE  . Erythromycin Base Diarrhea  . Hydrocodone-Acetaminophen Nausea And Vomiting  . Penicillins Diarrhea and Other (See Comments)    Has patient had a PCN reaction causing immediate rash, facial/tongue/throat  swelling, SOB or lightheadedness with hypotension: No Has patient had a PCN reaction causing severe rash involving mucus membranes or skin necrosis: No Has patient had a PCN reaction that required hospitalization: No Has patient had a PCN reaction occurring within the last 10 years: No If all of the above answers are "NO", then may proceed with Cephalosporin use. Has patient had a PCN reaction causing immediate rash, facial/tongue/throat swelling, SOB or lightheadedness with hypotension: No Has patient had  a PCN reaction causing severe rash involving mucus membranes or skin necrosis: No Has patient had a PCN reaction that required hospitalization: No Has patient had a PCN reaction occurring within the last 10 years: No If all of the above answers are "NO", then may proceed with Cephalosporin use.  . Acetaminophen-Codeine Nausea Only and Other (See Comments)  . Erythromycin Rash    Past Medical History:  Diagnosis Date  . Allergy   . Dizziness   . Gastroparesis    [facility]  . Migraine    [facility]  . PONV (postoperative nausea and vomiting)   . Weight decrease     Past Surgical History:  Procedure Laterality Date  . ABDOMINAL HYSTERECTOMY    . abdominalplasty     s/p weight loss  . BLOOD PATCH  2019  . brachiplasty     s/p weight loss   . CHOLECYSTECTOMY  09/04/2012   Procedure: LAPAROSCOPIC CHOLECYSTECTOMY;  Surgeon: Harl Bowie, MD;  Location: [facility];  Service: General;  Laterality: N/A;  . COLONOSCOPY  08/21/12   few scattered sigmoid diverticula: one sessile right colon polyp removed by cold biopsies x2:  patchy erythema in the left colon multiple random biopsies done:  normal therminal ileum  . COSMETIC SURGERY    . KNEE ARTHROSCOPY W/ ACL RECONSTRUCTION     in early 1990's  . LUMBAR PUNCTURE  2019  . TONSILLECTOMY      Social History   Socioeconomic History  . Marital status: Married    Spouse name: Not on file  . Number of children: 2  . Years of education: 103  . Highest education level: Some college, no degree  Occupational History  . Not on file  Social Needs  . Financial resource strain: Not on file  . Food insecurity    Worry: Not on file    Inability: Not on file  . Transportation needs    Medical: Not on file    Non-medical: Not on file  Tobacco Use  . Smoking status: Never Smoker  . Smokeless tobacco: Never Used  Substance and Sexual Activity  . Alcohol use: No  . Drug use: No  . Sexual activity: Not on file  Lifestyle   . Physical activity    Days per week: Not on file    Minutes per session: Not on file  . Stress: Not on file  Relationships  . Social Herbalist on phone: Not on file    Gets together: Not on file    Attends religious service: Not on file    Active member of club or organization: Not on file    Attends meetings of clubs or organizations: Not on file    Relationship status: Not on file  Other Topics Concern  . Not on file  Social History Narrative   Lives at home with husband and children    Family History  Problem Relation Age of Onset  . Diabetes Mother   . Hypertension Mother   .  Thyroid disease Mother   . Cancer Father   . Diabetes Father   . Hypertension Father   . Thyroid disease Sister   . Diabetes Brother   . Diabetes Maternal Grandmother   . Cancer Maternal Grandfather     Health Maintenance  Topic Date Due  . TETANUS/TDAP  12/15/1984  . PAP SMEAR-Modifier  12/16/1986  . MAMMOGRAM  12/16/2015  . COLONOSCOPY  12/16/2015  . INFLUENZA VACCINE  05/12/2019  . HIV Screening  Completed    ----------------------------------------------------------------------------------------------------------------------------------------------------------------------------------------------------------------- Physical Exam BP 118/82   Pulse 89   Temp 98.2 F (36.8 C) (Oral)   Ht 5\' 5"  (1.651 m)   Wt 218 lb (98.9 kg)   SpO2 99%   BMI 36.28 kg/m   Physical Exam Constitutional:      Appearance: Normal appearance.  HENT:     Head: Normocephalic and atraumatic.     Mouth/Throat:     Mouth: Mucous membranes are moist.  Eyes:     General: No scleral icterus. Neck:     Musculoskeletal: Neck supple.  Cardiovascular:     Rate and Rhythm: Normal rate and regular rhythm.  Skin:    General: Skin is warm and dry.     Findings: No rash.  Neurological:     General: No focal deficit present.     Mental Status: She is alert and oriented to person, place, and time.      Cranial Nerves: No cranial nerve deficit.     Sensory: No sensory deficit.     Motor: No weakness.     Coordination: Coordination normal.  Psychiatric:        Behavior: Behavior normal.     ------------------------------------------------------------------------------------------------------------------------------------------------------------------------------------------------------------------- Assessment and Plan  Memory loss -She has had an extensive work up regarding her cerebral atrophy and memory issues.  There may be something tying her symptom of gastroparesis and cerebral atrophy together but I'm not sure what it anything there is to add at this time.  I asked her to give me some time to completely review her records and suggested that she follow up with neurology to further discuss LP.  It may be worthwhile to look into some trace mineral deficiencies and/or repeat lyme titers.  I don't think her symptoms fit more rare conditions such as whipple disease.   >45 minutes spent with patient with 50% of time spent coordinating care and providing counseling as noted above.

## 2019-07-06 NOTE — Assessment & Plan Note (Signed)
-  She has had an extensive work up regarding her cerebral atrophy and memory issues.  There may be something tying her symptom of gastroparesis and cerebral atrophy together but I'm not sure what it anything there is to add at this time.  I asked her to give me some time to completely review her records and suggested that she follow up with neurology to further discuss LP.  It may be worthwhile to look into some trace mineral deficiencies and/or repeat lyme titers.  I don't think her symptoms fit more rare conditions such as whipple disease.

## 2019-07-12 ENCOUNTER — Encounter: Payer: Self-pay | Admitting: Family Medicine

## 2019-07-14 ENCOUNTER — Encounter: Payer: Self-pay | Admitting: Family Medicine

## 2019-07-16 ENCOUNTER — Other Ambulatory Visit: Payer: Self-pay | Admitting: Family Medicine

## 2019-07-16 DIAGNOSIS — K3184 Gastroparesis: Secondary | ICD-10-CM

## 2019-07-16 DIAGNOSIS — G319 Degenerative disease of nervous system, unspecified: Secondary | ICD-10-CM

## 2019-07-16 DIAGNOSIS — R413 Other amnesia: Secondary | ICD-10-CM

## 2019-07-18 ENCOUNTER — Other Ambulatory Visit: Payer: Self-pay

## 2019-07-18 ENCOUNTER — Other Ambulatory Visit (INDEPENDENT_AMBULATORY_CARE_PROVIDER_SITE_OTHER): Payer: 59

## 2019-07-18 DIAGNOSIS — R413 Other amnesia: Secondary | ICD-10-CM | POA: Diagnosis not present

## 2019-07-18 DIAGNOSIS — K3184 Gastroparesis: Secondary | ICD-10-CM

## 2019-07-18 DIAGNOSIS — G319 Degenerative disease of nervous system, unspecified: Secondary | ICD-10-CM | POA: Diagnosis not present

## 2019-07-18 LAB — TSH: TSH: 0.78 u[IU]/mL (ref 0.35–4.50)

## 2019-07-18 LAB — COMPREHENSIVE METABOLIC PANEL
ALT: 18 U/L (ref 0–35)
AST: 17 U/L (ref 0–37)
Albumin: 4.6 g/dL (ref 3.5–5.2)
Alkaline Phosphatase: 138 U/L — ABNORMAL HIGH (ref 39–117)
BUN: 15 mg/dL (ref 6–23)
CO2: 26 mEq/L (ref 19–32)
Calcium: 10 mg/dL (ref 8.4–10.5)
Chloride: 101 mEq/L (ref 96–112)
Creatinine, Ser: 0.7 mg/dL (ref 0.40–1.20)
GFR: 87.34 mL/min (ref >60.00)
Glucose, Bld: 99 mg/dL (ref 70–99)
Potassium: 4.2 mEq/L (ref 3.5–5.1)
Sodium: 137 mEq/L (ref 135–145)
Total Bilirubin: 0.5 mg/dL (ref 0.2–1.2)
Total Protein: 7.1 g/dL (ref 6.0–8.3)

## 2019-07-18 LAB — B12 AND FOLATE PANEL
Folate: >24.1 ng/mL (ref >5.9)
Vitamin B-12: 680 pg/mL (ref 211–911)

## 2019-07-18 LAB — VITAMIN D 25 HYDROXY (VIT D DEFICIENCY, FRACTURES): VITD: 24.7 ng/mL — ABNORMAL LOW (ref 30.00–100.00)

## 2019-07-18 LAB — MAGNESIUM: Magnesium: 1.9 mg/dL (ref 1.5–2.5)

## 2019-07-18 LAB — PHOSPHORUS: Phosphorus: 2.8 mg/dL (ref 2.3–4.6)

## 2019-07-19 LAB — ANTI-PARIETAL ANTIBODY: Parietal Cell Ab: 0.9 Units (ref 0.0–20.0)

## 2019-07-21 LAB — IRON,TIBC AND FERRITIN PANEL
%SAT: 22 % (calc) (ref 16–45)
Ferritin: 111 ng/mL (ref 16–232)
Iron: 76 ug/dL (ref 45–160)
TIBC: 343 mcg/dL (calc) (ref 250–450)

## 2019-07-21 LAB — MANGANESE: Manganese, Blood: 0.8 mcg/L (ref <1.2)

## 2019-07-21 LAB — HOMOCYSTEINE: Homocysteine: 7.3 umol/L (ref <10.4)

## 2019-07-21 LAB — ZINC: Zinc: 73 ug/dL (ref 60–130)

## 2019-07-21 LAB — VITAMIN A: Vitamin A (Retinoic Acid): 49 ug/dL (ref 38–98)

## 2019-07-21 LAB — COPPER, SERUM: Copper: 155 ug/dL (ref 70–175)

## 2019-07-21 LAB — METHYLMALONIC ACID, SERUM: Methylmalonic Acid, Quant: 53 nmol/L — ABNORMAL LOW (ref 87–318)

## 2019-08-14 ENCOUNTER — Ambulatory Visit: Payer: 59 | Attending: Physician Assistant | Admitting: Physical Therapy

## 2019-08-14 ENCOUNTER — Other Ambulatory Visit: Payer: Self-pay

## 2019-08-14 ENCOUNTER — Encounter: Payer: Self-pay | Admitting: Physical Therapy

## 2019-08-14 DIAGNOSIS — M25512 Pain in left shoulder: Secondary | ICD-10-CM | POA: Diagnosis not present

## 2019-08-14 DIAGNOSIS — M62838 Other muscle spasm: Secondary | ICD-10-CM | POA: Insufficient documentation

## 2019-08-14 DIAGNOSIS — M25612 Stiffness of left shoulder, not elsewhere classified: Secondary | ICD-10-CM | POA: Insufficient documentation

## 2019-08-14 NOTE — Therapy (Signed)
[address] [place], [place], [address] Phone: [phone]   Fax:  [phone]  Physical Therapy Evaluation  Patient Details  Name: [name] MRN: [id number] Date of Birth: 08-23-1966 Referring Provider (PT): Gerrit Halls   Encounter Date: 08/14/2019  PT End of Session - 08/14/19 1556    Visit Number  1    Number of Visits  23    Date for PT Re-Evaluation  10/14/19    PT Start Time  1528    PT Stop Time  1605    PT Time Calculation (min)  37 min    Activity Tolerance  Patient tolerated treatment well    Behavior During Therapy  [facility] for tasks assessed/performed       Past Medical History:  Diagnosis Date  . Allergy   . Dizziness   . Gastroparesis    [facility]  . Migraine    [facility]  . PONV (postoperative nausea and vomiting)   . Weight decrease     Past Surgical History:  Procedure Laterality Date  . ABDOMINAL HYSTERECTOMY    . abdominalplasty     s/p weight loss  . BLOOD PATCH  2019  . brachiplasty     s/p weight loss   . CHOLECYSTECTOMY  09/04/2012   Procedure: LAPAROSCOPIC CHOLECYSTECTOMY;  Surgeon: Harl Bowie, MD;  Location: [facility];  Service: General;  Laterality: N/A;  . COLONOSCOPY  08/21/12   few scattered sigmoid diverticula: one sessile right colon polyp removed by cold biopsies x2:  patchy erythema in the left colon multiple random biopsies done:  normal therminal ileum  . COSMETIC SURGERY    . KNEE ARTHROSCOPY W/ ACL RECONSTRUCTION     in early 1990's  . LUMBAR PUNCTURE  2019  . TONSILLECTOMY      There were no vitals filed for this visit.   Subjective Assessment - 08/14/19 1531    Subjective  Patient reports that she was helping lift a lawn mower about 2 months ago, she reports that she felt some discomfort that night, imaging was negative.  She reports that she walks a dog and it pulls on her and that has continued the left shoulder pain,  reaching into back seat and lift a pocketbook will increase her pain.    Pertinent History  Patient reports she has had dizziness for the past year with no precipitating event. Patient reports she has seen numerous specialist including two neurologists, neuropsychologist, cognitive behavioral therapist, ENT and PCP in regards to her symptoms. Patient reports she has had VNG testing, brain MRI in 2019, and a spinal tap where fluid was drawn out to relief pressure per patient report. Patient reports that the spinal tap was within the last 6 months. Patient reports her husband reported the patient's "balance was improved since drawing fluid off brain". Patient reports that she has another brain MRI scan ordered. Patient states that Dr. Manuella Ghazi has ordered a chest, hip and stomach CT, a Brain MRI with and without contrast, extensive blood work and checking her copper levels. Patient states that she has been having tongue fasciculations and has severe gastroparesis issues and follows a special carbohydrate diet. Patient reports she veers when she walks and at times will fall into the wall when walking. Patient uses shopping cart for support when out in stores. Patient states she has difficulty when she has to look up and down from her computer screen. Patient reports heading  turning brings on her symptoms, but that vertical head turns are worse than horizontal head turns. Patient reports when she would drive she felt she had difficulty with her peripheral vision. Patient states "it felt like the vehicles were coming in on her". She is currently not driving due to her symptoms. Patient reports depth perception difficulties especially with descending steps. Patient reports when she lies down she gets more of the vertigo feeling. Patient reports that she is afraid of falling. Patient states she does not think that a cane would help her and that she does not want to use a RW. Patient reports that she saw Dr. Pryor Ochoa, ENT  physician in regards to her symptoms. Patient reports she did have BPPV in her right ear and she had treatment in the ENT office. Patient reports the canal was  clear on the follow-up appointment. Patient reports that her symptoms of dizziness have improved some since treatment for BPPV. Patient states she is now getting episodes of dizziness 1-2 times a day that last varying amounts. Patient reports that sometimes her dizziness lasts 3-5 minutes, sometimes 10 minutes and other days the symptoms can be constant for 30 minutes and then continue to come and go throughout the day.    Patient Stated Goals  have less pain and be able to do all activities without pain    Currently in Pain?  Yes    Pain Score  4     Pain Location  Shoulder    Pain Orientation  Left    Pain Descriptors / Indicators  Aching    Pain Type  Acute pain    Pain Radiating Towards  reports some pain at times down the left lateral arm with walking the dog    Pain Onset  More than a month ago    Pain Frequency  Intermittent    Aggravating Factors   reaching, lifting pain up to 5-6/10    Pain Relieving Factors  no lifting, reaching    Effect of Pain on Daily Activities  difficulty reaching, lifting         [facility] PT Assessment - 08/14/19 0001      Assessment   Medical Diagnosis  left shoulder pain    Referring Provider (PT)  Gerrit Halls    Prior Therapy  not for the shoulder      Precautions   Precautions  None      Restrictions   Weight Bearing Restrictions  No      Balance Screen   Has the patient fallen in the past 6 months  No    Has the patient had a decrease in activity level because of a fear of falling?   No    Is the patient reluctant to leave their home because of a fear of falling?   No      Home Environment   Home Access  Stairs to enter    Home Layout  Two level    Additional Comments  do housework      Prior Function   Level of Independence  Independent with community mobility without device     Vocation  Unemployed;Self employed    Vocation Requirements  some book keeping    Leisure  walks the Physicist, medical Comments  fwd head, rounded shoulders      ROM / Strength   AROM / PROM / Strength  AROM;Strength      AROM  AROM Assessment Site  Shoulder    Right/Left Shoulder  Left    Left Shoulder Flexion  150 Degrees    Left Shoulder ABduction  150 Degrees    Left Shoulder Internal Rotation  60 Degrees    Left Shoulder External Rotation  70 Degrees      Strength   Overall Strength Comments  4-/5 with pain in the lateral shoulder       Palpation   Palpation comment  she is tender in the left deltoid area, is very tight in the upper trap      Special Tests   Other special tests  negative impingement, had pain and weakness iwth empty can test                Objective measurements completed on examination: See above findings.      [facility] Adult PT Treatment/Exercise - 08/14/19 0001      Modalities   Modalities  Iontophoresis      Iontophoresis   Type of Iontophoresis  Dexamethasone    Location  left deltoid area    Dose  66mA    Time  4 hour patch               PT Short Term Goals - 08/14/19 1601      PT SHORT TERM GOAL #1   Title  Patient will be independent with home exercise program for self management.    Time  3    Period  Weeks    Status  New        PT Long Term Goals - 08/14/19 1601      PT LONG TERM GOAL #1   Title  decrease pain 50% with activity    Time  8    Period  Weeks    Status  New      PT LONG TERM GOAL #2   Title  increase AROM to WNL's for the left shoulder    Time  8    Period  Weeks    Status  New      PT LONG TERM GOAL #3   Title  increase left shoulder strength to 4+/5    Time  8    Period  Weeks    Status  New      PT LONG TERM GOAL #4   Title  understand proper posture and body mechanics    Time  8    Period  Weeks    Status  New             Plan -  08/14/19 1557    Clinical Impression Statement  Patient reports helping lift a lawn mower about 2 months ago and having some increased left shoulder pain, she is right handed, she reports that over the past few months she has had difficulty walking the dog and difficulty with lifting anything from the back seat.  She had a negative impingement test had pain with empty can test, ROM is slightly limited as is strength.  She has pain and tenderness in the left lateral arm, she has tightness and spasms int he upper trap, has significant rounded shoulder posture    Personal Factors and Comorbidities  Comorbidity 2    Comorbidities  migraine headaches, dizziness    Examination-Activity Limitations  Carry;Reach Overhead    Stability/Clinical Decision Making  Stable/Uncomplicated    Clinical Decision Making  Moderate    Rehab Potential  Good    PT Frequency  1x / week    PT Duration  8 weeks    PT Treatment/Interventions  ADLs/Self Care Home Management;Cryotherapy;Electrical Stimulation;Iontophoresis 4mg /ml Dexamethasone;Ultrasound;Therapeutic activities;Therapeutic exercise;Neuromuscular re-education;Manual techniques;Patient/family education;Dry needling    PT Next Visit Plan  add HEP, work on scapular stabilization and posture    Consulted and Agree with Plan of Care  Patient       Patient will benefit from skilled therapeutic intervention in order to improve the following deficits and impairments:  Pain, Improper body mechanics, Postural dysfunction, Increased muscle spasms, Decreased range of motion, Decreased strength, Impaired UE functional use  Visit Diagnosis: Acute pain of left shoulder - Plan: PT plan of care cert/re-cert  Stiffness of left shoulder, not elsewhere classified - Plan: PT plan of care cert/re-cert  Other muscle spasm - Plan: PT plan of care cert/re-cert     Problem List Patient Active Problem List   Diagnosis Date Noted  . Memory loss 06/12/2018  . Biliary dyskinesia  08/28/2012  . Tobacco use 07/02/2012  . Migraine headache 07/02/2012    Sumner Boast., PT 08/14/2019, 4:05 PM  [address] [place], [place], [address] Phone: [phone]   Fax:  [phone]  Name: DEARRA COBOS MRN: [id number] Date of Birth: 1966-02-25

## 2019-08-21 ENCOUNTER — Ambulatory Visit: Payer: 59 | Admitting: Physical Therapy

## 2019-08-28 ENCOUNTER — Encounter: Payer: Self-pay | Admitting: Physical Therapy

## 2019-08-28 ENCOUNTER — Other Ambulatory Visit: Payer: Self-pay

## 2019-08-28 ENCOUNTER — Ambulatory Visit: Payer: 59 | Admitting: Physical Therapy

## 2019-08-28 DIAGNOSIS — M25612 Stiffness of left shoulder, not elsewhere classified: Secondary | ICD-10-CM

## 2019-08-28 DIAGNOSIS — M25512 Pain in left shoulder: Secondary | ICD-10-CM

## 2019-08-28 DIAGNOSIS — M62838 Other muscle spasm: Secondary | ICD-10-CM

## 2019-08-28 NOTE — Therapy (Signed)
[place] [address] [place], [place], [address] Phone: [phone]   Fax:  [phone]  Physical Therapy Treatment  Patient Details  Name: [name] MRN: [id number] Date of Birth: 1966/01/03 Referring Provider (PT): Gerrit Halls   Encounter Date: 08/28/2019  PT End of Session - 08/28/19 1229    Visit Number  2    Number of Visits  23    Date for PT Re-Evaluation  10/14/19    PT Start Time  1149    PT Stop Time  1230    PT Time Calculation (min)  41 min    Activity Tolerance  Patient tolerated treatment well    Behavior During Therapy  [facility] for tasks assessed/performed       Past Medical History:  Diagnosis Date  . Allergy   . Dizziness   . Gastroparesis    [facility]  . Migraine    [facility]  . PONV (postoperative nausea and vomiting)   . Weight decrease     Past Surgical History:  Procedure Laterality Date  . ABDOMINAL HYSTERECTOMY    . abdominalplasty     s/p weight loss  . BLOOD PATCH  2019  . brachiplasty     s/p weight loss   . CHOLECYSTECTOMY  09/04/2012   Procedure: LAPAROSCOPIC CHOLECYSTECTOMY;  Surgeon: Harl Bowie, MD;  Location: [place];  Service: General;  Laterality: N/A;  . COLONOSCOPY  08/21/12   few scattered sigmoid diverticula: one sessile right colon polyp removed by cold biopsies x2:  patchy erythema in the left colon multiple random biopsies done:  normal therminal ileum  . COSMETIC SURGERY    . KNEE ARTHROSCOPY W/ ACL RECONSTRUCTION     in early 1990's  . LUMBAR PUNCTURE  2019  . TONSILLECTOMY      There were no vitals filed for this visit.  Subjective Assessment - 08/28/19 1151    Subjective  "No better" "It is worst at night"    Currently in Pain?  No/denies                       [facility] Adult PT Treatment/Exercise - 08/28/19 0001      Exercises   Exercises  Shoulder      Shoulder Exercises: Standing   External Rotation   Strengthening;Both;20 reps;Theraband    Theraband Level (Shoulder External Rotation)  Level 1 (Yellow)    Internal Rotation  Left;Theraband;20 reps;Strengthening    Theraband Level (Shoulder Internal Rotation)  Level 2 (Red)    Flexion  Strengthening;20 reps;Both    Shoulder Flexion Weight (lbs)  2    ABduction  Theraband;20 reps;Both;Strengthening    Shoulder ABduction Weight (lbs)  1    Extension  Strengthening;20 reps;Theraband    Theraband Level (Shoulder Extension)  Level 2 (Red)    Row  Theraband;20 reps;Both;Strengthening    Theraband Level (Shoulder Row)  Level 2 (Red)      Shoulder Exercises: ROM/Strengthening   UBE (Upper Arm Bike)  L2 2 min each way     Nustep  L4 x 4 min                PT Short Term Goals - 08/28/19 1230      PT SHORT TERM GOAL #1   Title  Patient will be independent with home exercise program for self management.    Status  Achieved  PT Long Term Goals - 08/14/19 1601      PT LONG TERM GOAL #1   Title  decrease pain 50% with activity    Time  8    Period  Weeks    Status  New      PT LONG TERM GOAL #2   Title  increase AROM to WNL's for the left shoulder    Time  8    Period  Weeks    Status  New      PT LONG TERM GOAL #3   Title  increase left shoulder strength to 4+/5    Time  8    Period  Weeks    Status  New      PT LONG TERM GOAL #4   Title  understand proper posture and body mechanics    Time  8    Period  Weeks    Status  New            Plan - 08/28/19 1231    Clinical Impression Statement  Pt tolerated an initial progression to TE well. She reports some L shoulder discomfort with external rotation and abduction. Postural cues needed with rows and extensions. She reports that she has pain at night, sleeping on her L side with her L arm stretched out under pillow. Advised her to try to sleep in a different position to see if it helps.    Personal Factors and Comorbidities  Comorbidity 2    Comorbidities   migraine headaches, dizziness    Examination-Activity Limitations  Carry;Reach Overhead    Rehab Potential  Good    PT Frequency  1x / week    PT Duration  8 weeks    PT Treatment/Interventions  ADLs/Self Care Home Management;Cryotherapy;Electrical Stimulation;Iontophoresis 4mg /ml Dexamethasone;Ultrasound;Therapeutic activities;Therapeutic exercise;Neuromuscular re-education;Manual techniques;Patient/family education;Dry needling    PT Next Visit Plan  add HEP, work on scapular stabilization and posture       Patient will benefit from skilled therapeutic intervention in order to improve the following deficits and impairments:  Pain, Improper body mechanics, Postural dysfunction, Increased muscle spasms, Decreased range of motion, Decreased strength, Impaired UE functional use  Visit Diagnosis: Stiffness of left shoulder, not elsewhere classified  Acute pain of left shoulder  Other muscle spasm     Problem List Patient Active Problem List   Diagnosis Date Noted  . Memory loss 06/12/2018  . Biliary dyskinesia 08/28/2012  . Tobacco use 07/02/2012  . Migraine headache 07/02/2012    Scot Jun, PTA 08/28/2019, 12:33 PM  [address] [address] [place], [place], [address] Phone: [phone]   Fax:  [phone]  Name: [name] MRN: [id number] Date of Birth: 09-30-1966

## 2019-09-04 ENCOUNTER — Ambulatory Visit: Payer: 59 | Admitting: Physical Therapy

## 2019-09-04 ENCOUNTER — Other Ambulatory Visit: Payer: Self-pay

## 2019-09-04 ENCOUNTER — Encounter: Payer: Self-pay | Admitting: Physical Therapy

## 2019-09-04 DIAGNOSIS — M25512 Pain in left shoulder: Secondary | ICD-10-CM | POA: Diagnosis not present

## 2019-09-04 DIAGNOSIS — M25612 Stiffness of left shoulder, not elsewhere classified: Secondary | ICD-10-CM

## 2019-09-04 DIAGNOSIS — M62838 Other muscle spasm: Secondary | ICD-10-CM

## 2019-09-04 NOTE — Therapy (Signed)
[place] [address] [place], [place], [address] Phone: [phone]   Fax:  [phone]  Physical Therapy Treatment  Patient Details  Name: [name] MRN: [id number] Date of Birth: [birth year] Referring Provider (PT): Gerrit Halls   Encounter Date: 09/04/2019  PT End of Session - 09/04/19 1345    Visit Number  3    Number of Visits  23    Date for PT Re-Evaluation  10/14/19    PT Start Time  1300    PT Stop Time  1345    PT Time Calculation (min)  45 min    Activity Tolerance  Patient tolerated treatment well    Behavior During Therapy  [facility] for tasks assessed/performed       Past Medical History:  Diagnosis Date  . Allergy   . Dizziness   . Gastroparesis    [facility]  . Migraine    [facility]  . PONV (postoperative nausea and vomiting)   . Weight decrease     Past Surgical History:  Procedure Laterality Date  . ABDOMINAL HYSTERECTOMY    . abdominalplasty     s/p weight loss  . BLOOD PATCH  2019  . brachiplasty     s/p weight loss   . CHOLECYSTECTOMY  09/04/2012   Procedure: LAPAROSCOPIC CHOLECYSTECTOMY;  Surgeon: Harl Bowie, MD;  Location: [place];  Service: General;  Laterality: N/A;  . COLONOSCOPY  08/21/12   few scattered sigmoid diverticula: one sessile right colon polyp removed by cold biopsies x2:  patchy erythema in the left colon multiple random biopsies done:  normal therminal ileum  . COSMETIC SURGERY    . KNEE ARTHROSCOPY W/ ACL RECONSTRUCTION     in early 1990's  . LUMBAR PUNCTURE  2019  . TONSILLECTOMY      There were no vitals filed for this visit.  Subjective Assessment - 09/04/19 1303    Subjective  "About the same"    Currently in Pain?  No/denies    Pain Score  0-No pain                       [facility] Adult PT Treatment/Exercise - 09/04/19 0001      Shoulder Exercises: Seated   Other Seated Exercises  Rows & Lats 20lb 2x10        Shoulder Exercises: Standing   External Rotation  Strengthening;20 reps;Theraband;Left    Theraband Level (Shoulder External Rotation)  Level 2 (Red)    Internal Rotation  Left;Theraband;20 reps;Strengthening    Theraband Level (Shoulder Internal Rotation)  Level 2 (Red)    Flexion  Strengthening;20 reps;Both    Shoulder Flexion Weight (lbs)  3    ABduction  Theraband;20 reps;Both;Strengthening    Shoulder ABduction Weight (lbs)  1    Other Standing Exercises  Shrugs 5lb 2x10       Shoulder Exercises: ROM/Strengthening   UBE (Upper Arm Bike)  L2 3 min each way       Modalities   Modalities  Iontophoresis      Iontophoresis   Type of Iontophoresis  Dexamethasone    Location  left deltoid area    Dose  15m    Time  4 hour patch               PT Short Term Goals - 08/28/19 1230      PT SHORT TERM GOAL #1   Title  Patient will be independent with home exercise program for self management.    Status  Achieved        PT Long Term Goals - 09/04/19 1350      PT LONG TERM GOAL #2   Title  increase AROM to WNL's for the left shoulder    Status  Partially Met      PT LONG TERM GOAL #3   Title  increase left shoulder strength to 4+/5    Status  On-going            Plan - 09/04/19 1346    Clinical Impression Statement  Pt was able to complete all of today's activities. She does reports ome L shoulder burning with internal and external rotation. No reports of increase pain, but reports she could feel it with abduction. The pain is not constant but occasional based on movement. She reports still sleeping with her L arm stretchout under her pillow at night.    Comorbidities  migraine headaches, dizziness    Stability/Clinical Decision Making  Stable/Uncomplicated    Rehab Potential  Good    PT Frequency  1x / week    PT Duration  8 weeks    PT Treatment/Interventions  ADLs/Self Care Home Management;Cryotherapy;Electrical Stimulation;Iontophoresis 55m/ml  Dexamethasone;Ultrasound;Therapeutic activities;Therapeutic exercise;Neuromuscular re-education;Manual techniques;Patient/family education;Dry needling    PT Next Visit Plan  add HEP, work on scapular stabilization and posture       Patient will benefit from skilled therapeutic intervention in order to improve the following deficits and impairments:  Pain, Improper body mechanics, Postural dysfunction, Increased muscle spasms, Decreased range of motion, Decreased strength, Impaired UE functional use  Visit Diagnosis: Stiffness of left shoulder, not elsewhere classified  Acute pain of left shoulder  Other muscle spasm     Problem List Patient Active Problem List   Diagnosis Date Noted  . Memory loss 06/12/2018  . Biliary dyskinesia 08/28/2012  . Tobacco use 07/02/2012  . Migraine headache 07/02/2012    RScot Jun PTA 09/04/2019, 1:51 PM  [address] [place] [address]: [phone]  Fax:  [phone] Name: Betty DRENNENMRN: [id number] of Birth: [birth year]

## 2019-09-11 ENCOUNTER — Other Ambulatory Visit: Payer: Self-pay

## 2019-09-11 ENCOUNTER — Encounter: Payer: Self-pay | Admitting: Physical Therapy

## 2019-09-11 ENCOUNTER — Ambulatory Visit: Payer: 59 | Attending: Physician Assistant | Admitting: Physical Therapy

## 2019-09-11 DIAGNOSIS — M25612 Stiffness of left shoulder, not elsewhere classified: Secondary | ICD-10-CM | POA: Diagnosis present

## 2019-09-11 DIAGNOSIS — M25512 Pain in left shoulder: Secondary | ICD-10-CM

## 2019-09-11 DIAGNOSIS — M62838 Other muscle spasm: Secondary | ICD-10-CM | POA: Diagnosis present

## 2019-09-11 NOTE — Therapy (Signed)
[place] [address] [place], [place], [address] Phone: [phone]   Fax:  [phone]  Physical Therapy Treatment  Patient Details  Name: [name] MRN: [id number] Date of Birth: [birth year] Referring Provider (PT): Gerrit Halls   Encounter Date: 09/11/2019  PT End of Session - 09/11/19 1558    Visit Number  4    Date for PT Re-Evaluation  10/14/19    PT Start Time  6384    PT Stop Time  1559    PT Time Calculation (min)  44 min    Activity Tolerance  Patient tolerated treatment well    Behavior During Therapy  [facility] for tasks assessed/performed       Past Medical History:  Diagnosis Date  . Allergy   . Dizziness   . Gastroparesis    [facility]  . Migraine    [facility]  . PONV (postoperative nausea and vomiting)   . Weight decrease     Past Surgical History:  Procedure Laterality Date  . ABDOMINAL HYSTERECTOMY    . abdominalplasty     s/p weight loss  . BLOOD PATCH  2019  . brachiplasty     s/p weight loss   . CHOLECYSTECTOMY  09/04/2012   Procedure: LAPAROSCOPIC CHOLECYSTECTOMY;  Surgeon: Harl Bowie, MD;  Location: [place];  Service: General;  Laterality: N/A;  . COLONOSCOPY  08/21/12   few scattered sigmoid diverticula: one sessile right colon polyp removed by cold biopsies x2:  patchy erythema in the left colon multiple random biopsies done:  normal therminal ileum  . COSMETIC SURGERY    . KNEE ARTHROSCOPY W/ ACL RECONSTRUCTION     in early 1990's  . LUMBAR PUNCTURE  2019  . TONSILLECTOMY      There were no vitals filed for this visit.  Subjective Assessment - 09/11/19 1512    Subjective  Pt reports that she has not slept on her arm on purpose, No real improvement, "it is not bad enough for surgery "    Currently in Pain?  No/denies                       [facility] Adult PT Treatment/Exercise - 09/11/19 0001      Shoulder Exercises: Supine   External Rotation  Left;20 reps;Weights;Strengthening    External Rotation Weight (lbs)  2    Internal Rotation  Strengthening;Left;20 reps;Weights    Internal Rotation Limitations  2    Flexion  Left;20 reps;Weights    Shoulder Flexion Weight (lbs)  4      Shoulder Exercises: Seated   Other Seated Exercises  Rows & Lats 20lb 2x10     Other Seated Exercises  bentover rows & Ext  3lb 2x10      Shoulder Exercises: Standing   Horizontal ABduction  Theraband;20 reps;Both    Theraband Level (Shoulder Horizontal ABduction)  Level 2 (Red)      Shoulder Exercises: ROM/Strengthening   UBE (Upper Arm Bike)  L3 3 min each way       Modalities   Modalities  Iontophoresis      Iontophoresis   Type of Iontophoresis  Dexamethasone    Location  left deltoid area    Dose  85m    Time  4 hour patch               PT Short Term Goals - 08/28/19 1230  PT SHORT TERM GOAL #1   Title  Patient will be independent with home exercise program for self management.    Status  Achieved        PT Long Term Goals - 09/11/19 1559      PT LONG TERM GOAL #1   Title  decrease pain 50% with activity    Status  Partially Met      PT LONG TERM GOAL #2   Title  increase AROM to WNL's for the left shoulder    Status  Partially Met      PT LONG TERM GOAL #3   Title  increase left shoulder strength to 4+/5    Status  On-going      PT LONG TERM GOAL #4   Title  understand proper posture and body mechanics    Status  Achieved            Plan - 09/11/19 1559    Clinical Impression Statement  Pt did well with today's interventions. She still reports some posterior shoulder pain with certain movement and activities. She does report that she could feel a pull with horizontal abduction. Cues to go through the full ROM with supine internal rotation.    Comorbidities  migraine headaches, dizziness    PT Treatment/Interventions  ADLs/Self Care Home Management;Cryotherapy;Electrical  Stimulation;Iontophoresis 26m/ml Dexamethasone;Ultrasound;Therapeutic activities;Therapeutic exercise;Neuromuscular re-education;Manual techniques;Patient/family education;Dry needling    PT Next Visit Plan  add HEP, work on scapular stabilization and posture, Assess new interventions       Patient will benefit from skilled therapeutic intervention in order to improve the following deficits and impairments:  Pain, Improper body mechanics, Postural dysfunction, Increased muscle spasms, Decreased range of motion, Decreased strength, Impaired UE functional use  Visit Diagnosis: Acute pain of left shoulder  Other muscle spasm  Stiffness of left shoulder, not elsewhere classified     Problem List Patient Active Problem List   Diagnosis Date Noted  . Memory loss 06/12/2018  . Biliary dyskinesia 08/28/2012  . Tobacco use 07/02/2012  . Migraine headache 07/02/2012    RScot Jun PTA 09/11/2019, 4:03 PM  [address] [place] [address]Phone: [phone]  Fax:  [phone] Name: Betty ZACHOWMRN: [id number] of Birth: 304-12-1965

## 2019-09-18 ENCOUNTER — Other Ambulatory Visit: Payer: Self-pay

## 2019-09-18 ENCOUNTER — Ambulatory Visit: Payer: 59 | Admitting: Physical Therapy

## 2019-09-18 DIAGNOSIS — M25512 Pain in left shoulder: Secondary | ICD-10-CM | POA: Diagnosis not present

## 2019-09-18 NOTE — Therapy (Addendum)
[place] [address] [place], [place], [address] Phone: [phone]   Fax:  [phone]  Physical Therapy Treatment  Patient Details  Name: [name] MRN: [id number] Date of Birth: 24-Jun-1966 Referring Provider (PT): Gerrit Halls   Encounter Date: 09/18/2019  PT End of Session - 09/18/19 1343    Visit Number  5    Number of Visits  23    Date for PT Re-Evaluation  10/14/19    PT Start Time  1300    PT Stop Time  1343    PT Time Calculation (min)  43 min       Past Medical History:  Diagnosis Date  . Allergy   . Dizziness   . Gastroparesis    [facility]  . Migraine    [facility]  . PONV (postoperative nausea and vomiting)   . Weight decrease     Past Surgical History:  Procedure Laterality Date  . ABDOMINAL HYSTERECTOMY    . abdominalplasty     s/p weight loss  . BLOOD PATCH  2019  . brachiplasty     s/p weight loss   . CHOLECYSTECTOMY  09/04/2012   Procedure: LAPAROSCOPIC CHOLECYSTECTOMY;  Surgeon: Harl Bowie, MD;  Location: [place];  Service: General;  Laterality: N/A;  . COLONOSCOPY  08/21/12   few scattered sigmoid diverticula: one sessile right colon polyp removed by cold biopsies x2:  patchy erythema in the left colon multiple random biopsies done:  normal therminal ileum  . COSMETIC SURGERY    . KNEE ARTHROSCOPY W/ ACL RECONSTRUCTION     in early 1990's  . LUMBAR PUNCTURE  2019  . TONSILLECTOMY      There were no vitals filed for this visit.  Subjective Assessment - 09/18/19 1301    Subjective  "doing good". pt states that she has been in pain since last session and her shoulder hurts when she pick up her purse from the back seat.    Currently in Pain?  Yes    Pain Score  4     Pain Location  Shoulder    Pain Orientation  Left                       [facility] Adult PT Treatment/Exercise - 09/18/19 0001      Shoulder Exercises: Seated   Horizontal ABduction  Strengthening;Both;20 reps;Weights    Horizontal ABduction Weight (lbs)  2    Flexion  Strengthening;20 reps;Weights    Flexion Weight (lbs)  2      Shoulder Exercises: Standing   External Rotation  Strengthening;20 reps;Theraband;Left    Theraband Level (Shoulder External Rotation)  Level 3 (Green)    Internal Rotation  Left;Theraband;20 reps;Strengthening    Theraband Level (Shoulder Internal Rotation)  Level 3 (Green)      Shoulder Exercises: ROM/Strengthening   UBE (Upper Arm Bike)  L3 3 min each way     Lat Pull  2 plate;15 reps    Cybex Row  2 plate;15 reps      Shoulder Exercises: Stretch   Other Shoulder Stretches  sleeper stretch x 4      Iontophoresis   Type of Iontophoresis  Dexamethasone    Location  left shoulder    Dose  27m    Time  4 hour patch               PT Short Term Goals -  08/28/19 1230      PT SHORT TERM GOAL #1   Title  Patient will be independent with home exercise program for self management.    Status  Achieved        PT Long Term Goals - 09/18/19 1346      PT LONG TERM GOAL #1   Title  decrease pain 50% with activity    Status  Partially Met      PT LONG TERM GOAL #3   Title  increase left shoulder strength to 4+/5    Status  On-going            Plan - 09/18/19 1347    Clinical Impression Statement  pt complains of pain in the deltoid with certain movements and activities. she says she feels a pull when going into horizontal abduction. pt agreed to keep trying ionto as seems to be helping. sleeper stretch was performed, pt was not terribly tight.    Personal Factors and Comorbidities  Comorbidity 2    Comorbidities  migraine headaches, dizziness    Examination-Activity Limitations  Carry;Reach Overhead    Stability/Clinical Decision Making  Stable/Uncomplicated    Rehab Potential  Good    PT Frequency  1x / week    PT Duration  8 weeks    PT Treatment/Interventions  ADLs/Self Care Home  Management;Cryotherapy;Electrical Stimulation;Iontophoresis 28m/ml Dexamethasone;Ultrasound;Therapeutic activities;Therapeutic exercise;Neuromuscular re-education;Manual techniques;Patient/family education;Dry needling    PT Next Visit Plan   work on scapular stabilization and posture, Assess new interventions     HEP given Rows, Extensions, sleepers stretch  Patient will benefit from skilled therapeutic intervention in order to improve the following deficits and impairments:  Pain, Improper body mechanics, Postural dysfunction, Increased muscle spasms, Decreased range of motion, Decreased strength, Impaired UE functional use  Visit Diagnosis: Acute pain of left shoulder     Problem List Patient Active Problem List   Diagnosis Date Noted  . Memory loss 06/12/2018  . Biliary dyskinesia 08/28/2012  . Tobacco use 07/02/2012  . Migraine headache 07/02/2012    SBarrett Henle SRonceverte12/05/2019, 2:04 PM  [address] [place] [address]Phone: [phone]  Fax:  [phone] Name: Betty BUEHRLEMRN: [id number] of Birth: 307-29-1967

## 2019-10-18 ENCOUNTER — Telehealth (INDEPENDENT_AMBULATORY_CARE_PROVIDER_SITE_OTHER): Payer: 59 | Admitting: Family Medicine

## 2019-10-18 ENCOUNTER — Other Ambulatory Visit: Payer: Self-pay

## 2019-10-18 ENCOUNTER — Encounter: Payer: Self-pay | Admitting: Family Medicine

## 2019-10-18 DIAGNOSIS — J0100 Acute maxillary sinusitis, unspecified: Secondary | ICD-10-CM

## 2019-10-18 MED ORDER — DOXYCYCLINE HYCLATE 100 MG PO TABS: 100.0000 mg | ORAL_TABLET | Freq: Two times a day (BID) | ORAL | 0 refills | Status: DC

## 2019-10-18 NOTE — Assessment & Plan Note (Signed)
Will treat with 10 day course of doxycycline given prolonged and worsening symptoms.  She should drink plenty of fluid She may continue OTC medications as needed. Chronic clear rhinorrea and blood taste in mouth is a little concerning for possible spontaneous CSF leak, especially in setting of chronic migraines. May be worthwhile to test fluid for beta-2 transferrin if this returns.    Call for any new or worsening symptom or if symptoms fail to improve with treatment.

## 2019-10-18 NOTE — Progress Notes (Signed)
[name] - 54 y.o. female MRN [id number]  Date of birth: [birth year]   This visit type was conducted due to national recommendations for restrictions regarding the COVID-19 Pandemic (e.g. social distancing).  This format is felt to be most appropriate for this patient at this time.  All issues noted in this document were discussed and addressed.  No physical exam was performed (except for noted visual exam findings with Video Visits).  I discussed the limitations of evaluation and management by telemedicine and the availability of in person appointments. The patient expressed understanding and agreed to proceed.  I connected with@ on 10/18/19 at 10:00 AM EST by a video enabled telemedicine application and verified that I am speaking with the correct person using two identifiers.  Present at visit: Betty Nutting, DO [name]   Patient Location: Home [address] [place] [address]   Provider location:   Home office  Chief Complaint  Patient presents with  . Headache    pt is c/o headache,runny nose,sore throat/dry from drainage and sinus pressure/going on 10 days/took migrain med,sudafed and mucinex sinus/mas spray.    HPI  [name] is a 54 y.o. female who presents via audio/video conferencing for a telehealth visit today.  She has complaint of sinus pain with mild congestion, sore throat from post nasal drainage.  Sinus pain is worse on the R.  Symptoms have persisted for about 10-12 days. She reports she often has clear drainage, almost daily from her nose that is worse with leaning forward but this has subsided due to her current congestion. She also reports taste of blood in her mouth from time to time.  Her current headache has not resolved with her typical migraine medications.   She denies fever or lower respiratory symptoms.    ROS:  A comprehensive ROS was completed and negative except as noted per HPI  Past Medical History:  Diagnosis Date  . Allergy   .  Dizziness   . Gastroparesis    [facility]  . Migraine    [facility]  . PONV (postoperative nausea and vomiting)   . Weight decrease     Past Surgical History:  Procedure Laterality Date  . ABDOMINAL HYSTERECTOMY    . abdominalplasty     s/p weight loss  . BLOOD PATCH  2019  . brachiplasty     s/p weight loss   . CHOLECYSTECTOMY  09/04/2012   Procedure: LAPAROSCOPIC CHOLECYSTECTOMY;  Surgeon: Harl Bowie, MD;  Location: [facility];  Service: General;  Laterality: N/A;  . COLONOSCOPY  08/21/12   few scattered sigmoid diverticula: one sessile right colon polyp removed by cold biopsies x2:  patchy erythema in the left colon multiple random biopsies done:  normal therminal ileum  . COSMETIC SURGERY    . KNEE ARTHROSCOPY W/ ACL RECONSTRUCTION     in early 1990's  . LUMBAR PUNCTURE  2019  . TONSILLECTOMY      Family History  Problem Relation Age of Onset  . Diabetes Mother   . Hypertension Mother   . Thyroid disease Mother   . Cancer Father   . Diabetes Father   . Hypertension Father   . Thyroid disease Sister   . Diabetes Brother   . Diabetes Maternal Grandmother   . Cancer Maternal Grandfather     Social History   Socioeconomic History  . Marital status: Married    Spouse name: Not on file  . Number of children: 2  .  Years of education: 36  . Highest education level: Some college, no degree  Occupational History  . Not on file  Tobacco Use  . Smoking status: Never Smoker  . Smokeless tobacco: Never Used  Substance and Sexual Activity  . Alcohol use: No  . Drug use: No  . Sexual activity: Not on file  Other Topics Concern  . Not on file  Social History Narrative   Lives at home with husband and children   Social Determinants of Health   Financial Resource Strain:   . Difficulty of Paying Living Expenses: Not on file  Food Insecurity:   . Worried About Charity fundraiser in the Last Year: Not on file  . Ran Out of Food in the Last Year:  Not on file  Transportation Needs:   . Lack of Transportation (Medical): Not on file  . Lack of Transportation (Non-Medical): Not on file  Physical Activity:   . Days of Exercise per Week: Not on file  . Minutes of Exercise per Session: Not on file  Stress:   . Feeling of Stress : Not on file  Social Connections:   . Frequency of Communication with Friends and Family: Not on file  . Frequency of Social Gatherings with Friends and Family: Not on file  . Attends Religious Services: Not on file  . Active Member of Clubs or Organizations: Not on file  . Attends Archivist Meetings: Not on file  . Marital Status: Not on file  Intimate Partner Violence:   . Fear of Current or Ex-Partner: Not on file  . Emotionally Abused: Not on file  . Physically Abused: Not on file  . Sexually Abused: Not on file     Current Outpatient Medications:  .  dexlansoprazole (DEXILANT) 60 MG capsule, Take 60 mg by mouth daily., Disp: , Rfl:  .  Ibuprofen-Diphenhydramine Cit (MOTRIN PM) 200-38 MG TABS, Take 3 tablets by mouth at bedtime as needed (pain)., Disp: , Rfl:  .  OnabotulinumtoxinA (BOTOX IM), Inject into the muscle., Disp: , Rfl:  .  ondansetron (ZOFRAN-ODT) 8 MG disintegrating tablet, Take 1 tablet by mouth as needed., Disp: , Rfl:  .  promethazine (PHENERGAN) 25 MG tablet, Take 25 mg by mouth daily as needed for nausea or vomiting. , Disp: , Rfl:  .  rizatriptan (MAXALT) 10 MG tablet, Take 10 mg by mouth See admin instructions. Take 1 tablet (10 mg) by mouth as needed for migraine headache, May repeat in 2 hours if needed, Disp: , Rfl:  .  albuterol (PROVENTIL HFA;VENTOLIN HFA) 108 (90 BASE) MCG/ACT inhaler, Inhale 2 puffs into the lungs every 4 (four) hours as needed for wheezing or shortness of breath (cough, shortness of breath or wheezing.). (Patient not taking: Reported on 10/18/2019), Disp: 1 Inhaler, Rfl: 1 .  buPROPion (WELLBUTRIN XL) 150 MG 24 hr tablet, Take 150 mg by mouth daily. ,  Disp: , Rfl:   Current Facility-Administered Medications:  .  albuterol (PROVENTIL) (2.5 MG/3ML) 0.083% nebulizer solution 2.5 mg, 2.5 mg, Nebulization, Once, Daub, Loura Back, MD  EXAM:  VITALS per patient if applicable: Temp (!) 123456 F (35.7 C) (Tympanic) Comment: pt reprt  Ht 5\' 5"  (1.651 m)   Wt 205 lb (93 kg) Comment: pt reprt  BMI 34.11 kg/m   GENERAL: alert, oriented, appears well and in no acute distress  HEENT: atraumatic, conjunttiva clear, no obvious abnormalities on inspection of external nose and ears  NECK: normal movements of the head  and neck  LUNGS: on inspection no signs of respiratory distress, breathing rate appears normal, no obvious gross SOB, gasping or wheezing  CV: no obvious cyanosis  MS: moves all visible extremities without noticeable abnormality  PSYCH/NEURO: pleasant and cooperative, no obvious depression or anxiety, speech and thought processing grossly intact  ASSESSMENT AND PLAN:  Discussed the following assessment and plan:  Acute maxillary sinusitis Will treat with 10 day course of doxycycline given prolonged and worsening symptoms.  She should drink plenty of fluid She may continue OTC medications as needed. Chronic clear rhinorrea and blood taste in mouth is a little concerning for possible spontaneous CSF leak, especially in setting of chronic migraines. May be worthwhile to test fluid for beta-2 transferrin if this returns.    Call for any new or worsening symptom or if symptoms fail to improve with treatment.      I discussed the assessment and treatment plan with the patient. The patient was provided an opportunity to ask questions and all were answered. The patient agreed with the plan and demonstrated an understanding of the instructions.   The patient was advised to call back or seek an in-person evaluation if the symptoms worsen or if the condition fails to improve as anticipated.    Betty Nutting, DO

## 2020-01-11 IMAGING — MR MRI HEAD WITHOUT AND WITH CONTRAST
15 series · 48 of 48 positions shown · IV contrast (10ml Gadavist)
Comparison: None.

CLINICAL DATA: Short term memory loss 1 year.

EXAM:
MRI HEAD WITHOUT AND WITH CONTRAST
TECHNIQUE: Multiplanar, multiecho pulse sequences of the brain and surrounding
structures were obtained without and with intravenous contrast.
CONTRAST:  10 mL Gadovist IV

[Series 5: ax dwi_tracew · axial · 3.0mm · 0.60mm/px · z∈[-86,+64]mm · 2 of 48 slices shown]
[im 1/48]
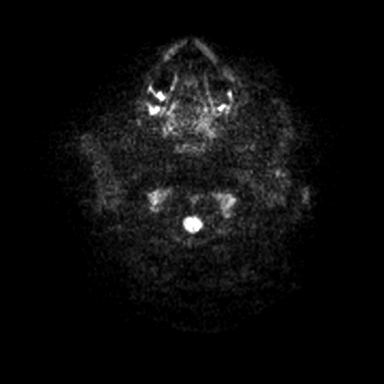
[im 48/48]
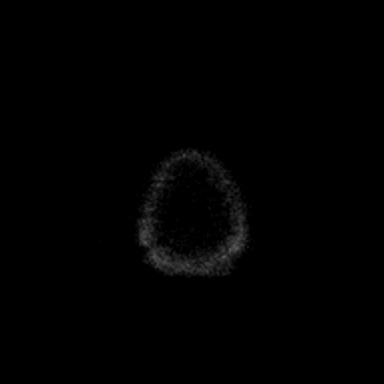

[Series 6: ax dwi_adc · axial · 3.0mm · 0.60mm/px · z∈[-86,+64]mm · 3 of 48 slices shown]
[im 1/48]
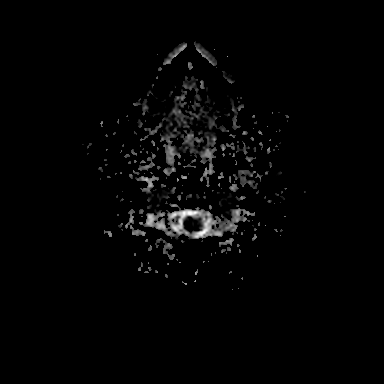
[im 24/48]
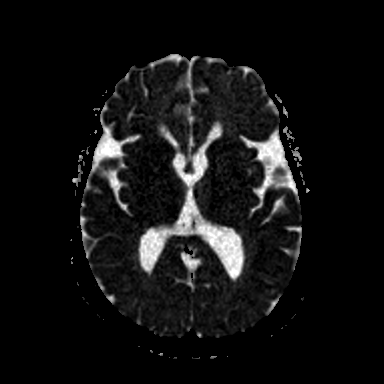
[im 48/48]
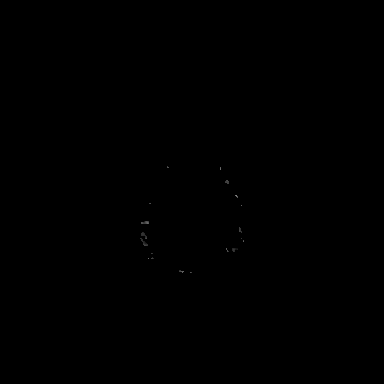

[Series 7: cor dwi_tracew · coronal · 5.0mm · 0.60mm/px · 2 of 40 slices shown]
[im 1/40]
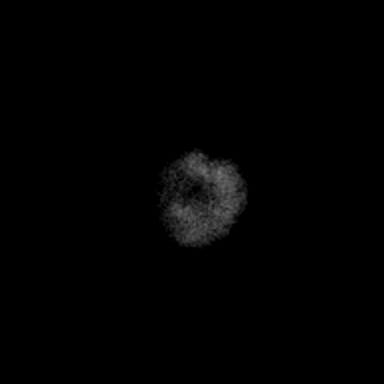
[im 40/40]
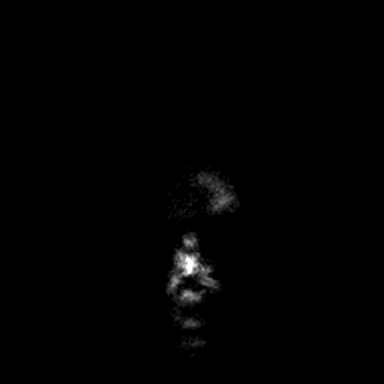

[Series 8: cor dwi_adc · coronal · 5.0mm · 0.60mm/px · 2 of 37 slices shown]
[im 1/37]
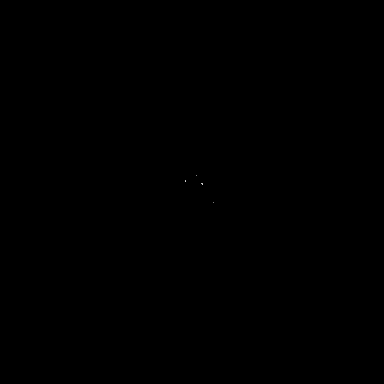
[im 37/37]
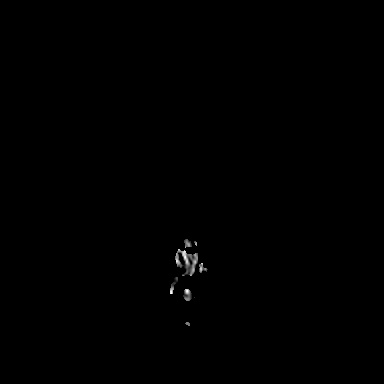

[Series 9: T1 · sagittal · 5.0mm · 0.62mm/px · 1 of 22 slices shown (1 of 2)]
[im 1/22]
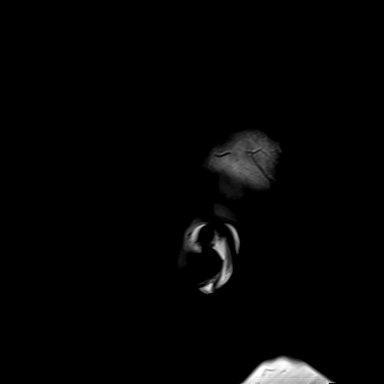

[Series 10: T2 · axial · 5.0mm · 0.53mm/px · 1 of 25 slices shown]
[im 1/25]
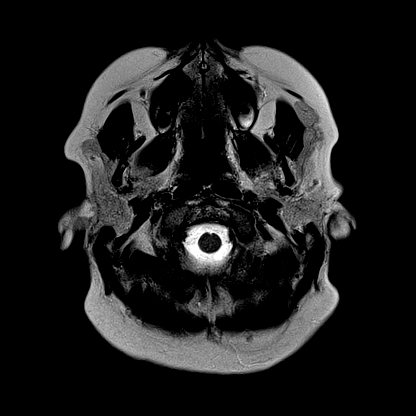

[Series 11: mag_images · axial · 3.0mm · 0.90mm/px · z∈[-97,+75]mm · 3 of 60 slices shown]
[im 1/60]
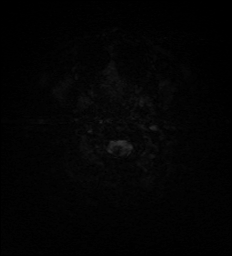
[im 30/60]
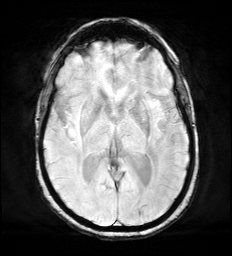
[im 60/60]
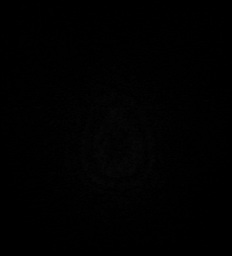

[Series 12: pha_images · axial · 3.0mm · 0.90mm/px · z∈[-97,+73]mm · 3 of 59 slices shown]
[im 1/59]
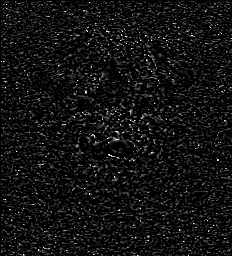
[im 30/59]
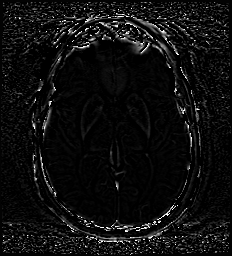
[im 59/59]
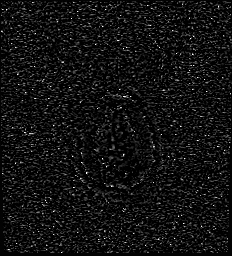

[Series 13: swi_images · axial · 3.0mm · 0.90mm/px · z∈[-97,+75]mm · 3 of 60 slices shown]
[im 1/60]
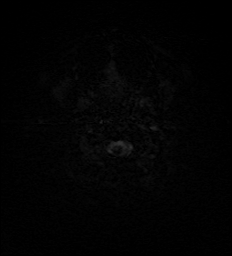
[im 30/60]
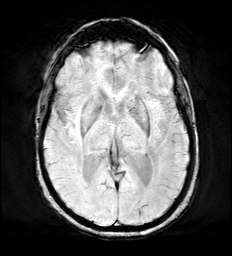
[im 60/60]
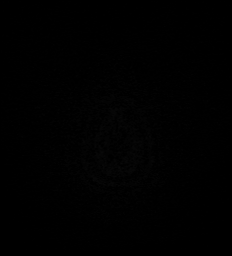

[Series 15: FLAIR · axial · 3.0mm · 0.53mm/px · z∈[-90,+67]mm · 3 of 55 slices shown]
[im 1/55]
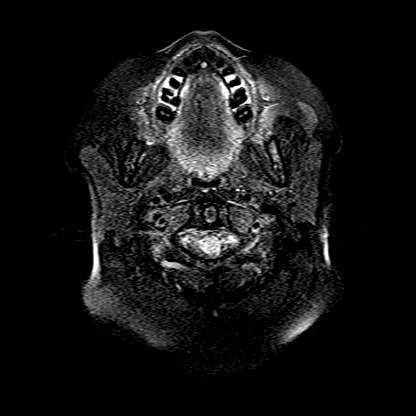
[im 28/55]
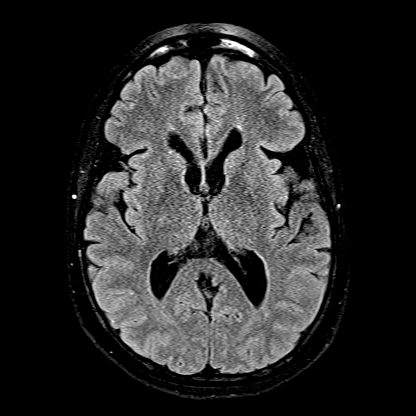
[im 55/55]
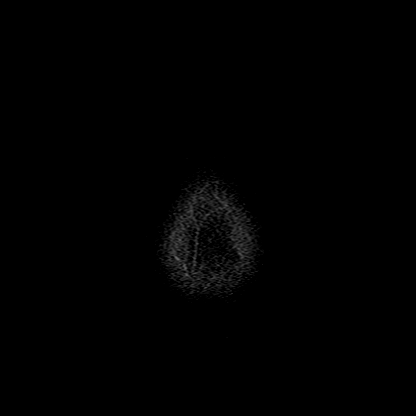

[Series 16: T1 · axial · 1.0mm · 0.98mm/px · z∈[-94,+76]mm · 10 of 176 slices shown (2 of 2)]
[im 1/176]
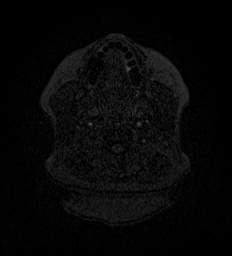
[im 20/176]
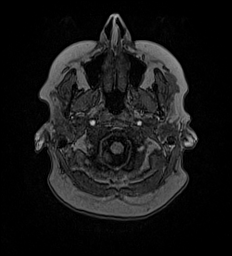
[im 39/176]
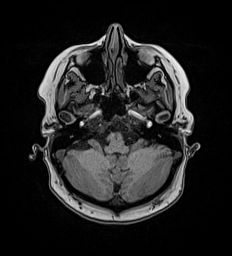
[im 59/176]
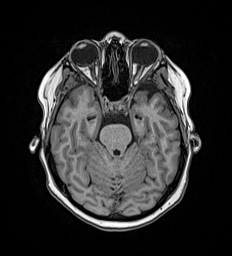
[im 78/176]
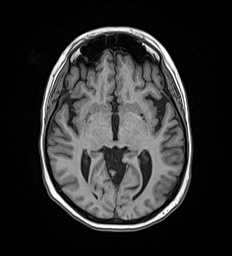
[im 98/176]
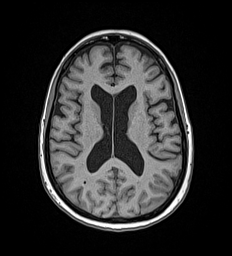
[im 117/176]
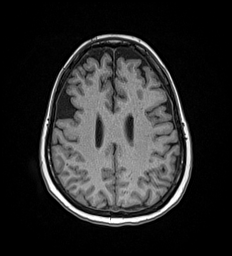
[im 137/176]
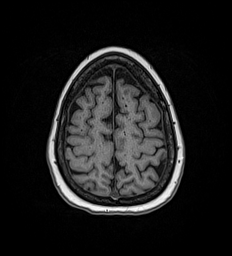
[im 156/176]
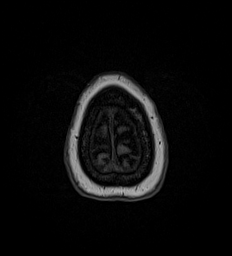
[im 176/176]
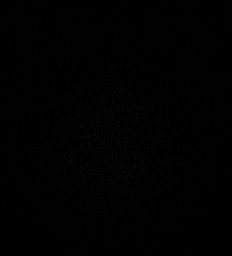

[Series 17: T2 post-contrast · coronal · 5.0mm · 0.57mm/px · 2 of 29 slices shown]
[im 1/29]
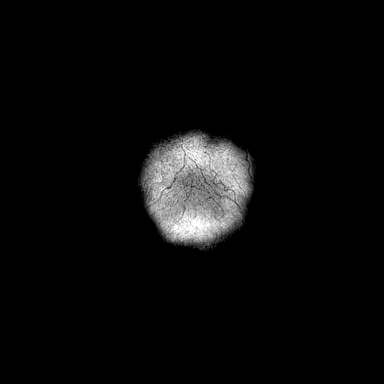
[im 29/29]
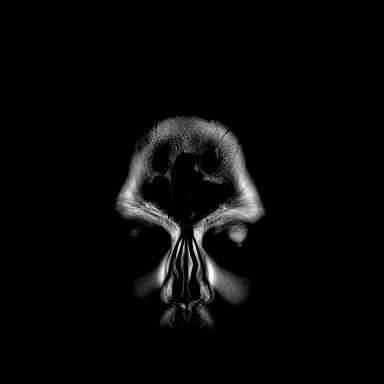

[Series 18: T1 post-contrast · axial · 1.0mm · 0.98mm/px · z∈[-94,+76]mm · 10 of 176 slices shown (1 of 3)]
[im 1/176]
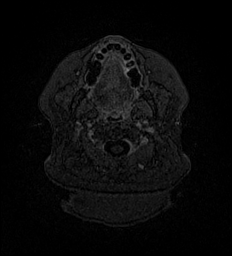
[im 20/176]
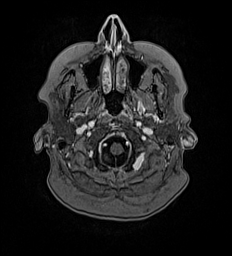
[im 39/176]
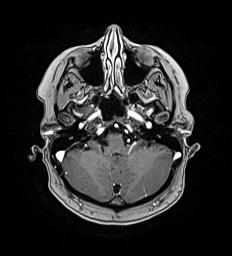
[im 59/176]
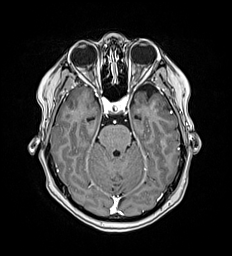
[im 78/176]
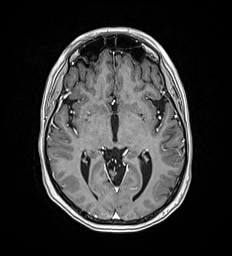
[im 98/176]
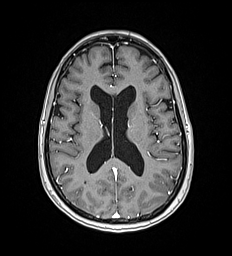
[im 117/176]
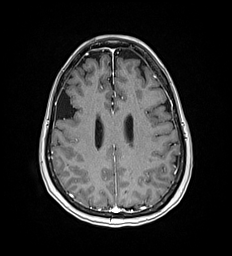
[im 137/176]
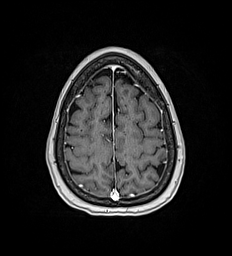
[im 156/176]
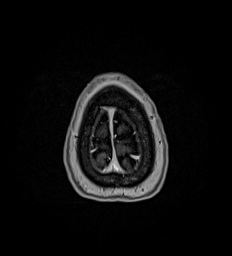
[im 176/176]
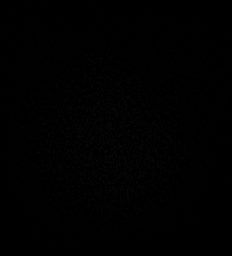

[Series 19: T1 post-contrast · coronal · 5.0mm · 0.57mm/px · 2 of 29 slices shown (2 of 3)]
[im 1/29]
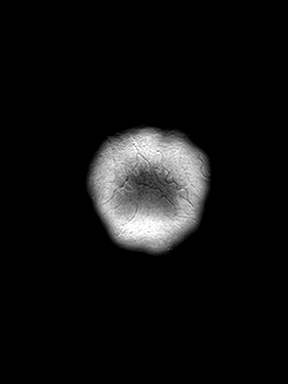
[im 29/29]
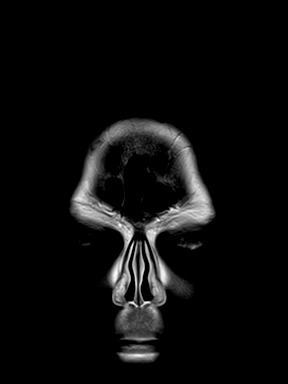

[Series 20: T1 post-contrast · sagittal · 5.0mm · 0.62mm/px · 1 of 22 slices shown (3 of 3)]
[im 1/22]
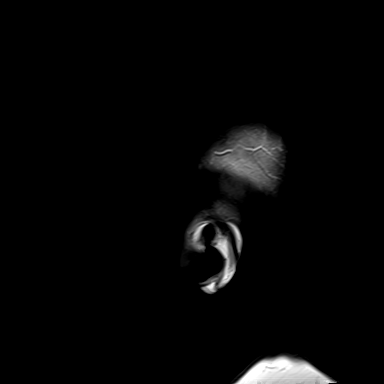

[48 of 48 positions shown; findings below may reference images not displayed]

FINDINGS: Brain: Mild atrophy. Mild prominence of the sylvian fissure and
ventricles bilaterally. Focal atrophy right lateral frontal lobe.
Hippocampal atrophy bilaterally.

Negative for acute or chronic infarct. Negative for hemorrhage or
mass. Normal enhancement following contrast infusion.

Vascular: Normal arterial flow voids.

Skull and upper cervical spine: Negative

Sinuses/Orbits: Negative

Other: None
IMPRESSION: Generalized cerebral atrophy. Hippocampal atrophy. Correlate with
symptoms of dementia.

No acute abnormality.

## 2020-02-27 ENCOUNTER — Telehealth (INDEPENDENT_AMBULATORY_CARE_PROVIDER_SITE_OTHER): Payer: 59 | Admitting: Family Medicine

## 2020-02-27 ENCOUNTER — Encounter: Payer: Self-pay | Admitting: Family Medicine

## 2020-02-27 VITALS — Temp 98.7°F | Ht 65.0 in

## 2020-02-27 DIAGNOSIS — S82202S Unspecified fracture of shaft of left tibia, sequela: Secondary | ICD-10-CM

## 2020-02-27 DIAGNOSIS — S82402D Unspecified fracture of shaft of left fibula, subsequent encounter for closed fracture with routine healing: Secondary | ICD-10-CM

## 2020-02-27 DIAGNOSIS — S82202D Unspecified fracture of shaft of left tibia, subsequent encounter for closed fracture with routine healing: Secondary | ICD-10-CM | POA: Diagnosis not present

## 2020-02-27 MED ORDER — HYDROCODONE-ACETAMINOPHEN 10-325 MG PO TABS: 1.0000 | ORAL_TABLET | ORAL | 0 refills | Status: AC | PRN

## 2020-02-27 NOTE — Progress Notes (Signed)
Virtual Visit via Video Note  I connected with [name] on 02/27/20 at  4:00 PM EDT by a video enabled telemedicine application and verified that I am speaking with the correct person using two identifiers. Location patient: home Location provider: work  Persons participating in the virtual visit: patient, provider  I discussed the limitations of evaluation and management by telemedicine and the availability of in person appointments. The patient expressed understanding and agreed to proceed.  Chief Complaint  Patient presents with  . Follow-up    follow up visit on surgery on fractured tibia and fibula patient still in lots of pain.      HPI: [name] is a 54 y.o. female is a previous pt of Dr. Zigmund Daniel. She was in [facility] on vacation, missed a step off the curb, and fractured her Lt lower leg. She states it was reduced in the [facility] ER and she had surgery on 02/22/20. She was discharged, came home to [facility], non-weightbearing x 6-8 wks. She was  She is a patient at [facility] Dr. Wylene Simmer and has appt on 03/03/20.  Pt states she has records from hospital including xrays and operative report and will take to ortho appt on Mon 5/24.   Past Medical History:  Diagnosis Date  . Allergy   . Dizziness   . Gastroparesis    [facility]  . Migraine    [facility]  . PONV (postoperative nausea and vomiting)   . Weight decrease     Past Surgical History:  Procedure Laterality Date  . ABDOMINAL HYSTERECTOMY    . abdominalplasty     s/p weight loss  . BLOOD PATCH  2019  . brachiplasty     s/p weight loss   . CHOLECYSTECTOMY  09/04/2012   Procedure: LAPAROSCOPIC CHOLECYSTECTOMY;  Surgeon: Harl Bowie, MD;  Location: [facility];  Service: General;  Laterality: N/A;  . COLONOSCOPY  08/21/12   few scattered sigmoid diverticula: one sessile right colon polyp removed by cold biopsies x2:  patchy erythema in the left colon multiple  random biopsies done:  normal therminal ileum  . COSMETIC SURGERY    . KNEE ARTHROSCOPY W/ ACL RECONSTRUCTION     in early 1990's  . LUMBAR PUNCTURE  2019  . TONSILLECTOMY      Family History  Problem Relation Age of Onset  . Diabetes Mother   . Hypertension Mother   . Thyroid disease Mother   . Cancer Father   . Diabetes Father   . Hypertension Father   . Thyroid disease Sister   . Diabetes Brother   . Diabetes Maternal Grandmother   . Cancer Maternal Grandfather     Social History   Tobacco Use  . Smoking status: Never Smoker  . Smokeless tobacco: Never Used  Substance Use Topics  . Alcohol use: No  . Drug use: No     Current Outpatient Medications:  .  AJOVY 225 MG/1.5ML SOSY, , Disp: , Rfl:  .  aspirin 325 MG EC tablet, , Disp: , Rfl:  .  buPROPion (WELLBUTRIN SR) 200 MG 12 hr tablet, , Disp: , Rfl:  .  dexlansoprazole (DEXILANT) 60 MG capsule, Take 60 mg by mouth daily., Disp: , Rfl:  .  donepezil (ARICEPT) 5 MG tablet, Take 5 mg by mouth daily., Disp: , Rfl:  .  OnabotulinumtoxinA (BOTOX IM), Inject into the muscle., Disp: , Rfl:  .  ondansetron (ZOFRAN-ODT) 8 MG disintegrating tablet,  Take 1 tablet by mouth as needed., Disp: , Rfl:  .  promethazine (PHENERGAN) 25 MG tablet, Take 25 mg by mouth daily as needed for nausea or vomiting. , Disp: , Rfl:  .  rizatriptan (MAXALT) 10 MG tablet, Take 10 mg by mouth See admin instructions. Take 1 tablet (10 mg) by mouth as needed for migraine headache, May repeat in 2 hours if needed, Disp: , Rfl:   Current Facility-Administered Medications:  .  albuterol (PROVENTIL) (2.5 MG/3ML) 0.083% nebulizer solution 2.5 mg, 2.5 mg, Nebulization, Once, Daub, Loura Back, MD  Allergies  Allergen Reactions  . Codeine Nausea And Vomiting and Other (See Comments)    Severe Vomiting for days.  All forms of codeine, including synthetic.  Pt can take hydrocodone with food.  Update 08/15/18 pt now reports she does ok with the medication,  just need some food. If taking more than a few days, she gets a headache. Severe Vomiting for days.  All forms of codeine, including synthetic.  Pt can take hydrocodone with food. Severe Vomiting for days.  All forms of codeine, including synthetic.  Pt can take hydrocodone with food.  Update 08/15/18 pt now reports she does ok with the medication, just need some food. If taking more than a few days, she gets a headache. Severe Vomiting for days.  All forms of codeine, including synthetic.  Pt can take hydrocodone with food. Severe Vomiting for days.  All forms of codeine, including synthetic.  Pt can take hydrocodone with food.  Update 08/15/18 pt now reports she does ok with the medication, just need some food. If taking more than a few days, she gets a headache. Severe Vomiting for days.  All forms of codeine, including synthetic.  Pt can take hydrocodone with food.  Update 08/15/18 pt now reports she does ok with the medication, just need some food. If taking more than a few days, she gets a headache.   . Lasmiditan Palpitations    Sleeplessness, Anxiety  . Ondansetron Palpitations    Reaction to tablets - ok with I.V. Denies allergy Denies allergy Denies allergy Denies allergy Reaction to tablets - ok with I.V.   . Oxycodone-Acetaminophen Nausea And Vomiting    ANYTHING WITH CODEINE  . Erythromycin Rash  . Erythromycin Base Diarrhea  . Hydrocodone-Acetaminophen Nausea And Vomiting  . Penicillins Diarrhea and Other (See Comments)    Has patient had a PCN reaction causing immediate rash, facial/tongue/throat swelling, SOB or lightheadedness with hypotension: No Has patient had a PCN reaction causing severe rash involving mucus membranes or skin necrosis: No Has patient had a PCN reaction that required hospitalization: No Has patient had a PCN reaction occurring within the last 10 years: No If all of the above answers are "NO", then may proceed with Cephalosporin use. Has  patient had a PCN reaction causing immediate rash, facial/tongue/throat swelling, SOB or lightheadedness with hypotension: No Has patient had a PCN reaction causing severe rash involving mucus membranes or skin necrosis: No Has patient had a PCN reaction that required hospitalization: No Has patient had a PCN reaction occurring within the last 10 years: No If all of the above answers are "NO", then may proceed with Cephalosporin use.  . Acetaminophen-Codeine Nausea Only and Other (See Comments)      ROS: See pertinent positives and negatives per HPI.   EXAM:  VITALS per patient if applicable: Temp [id number] F (37.1 C) (Tympanic)   Ht 5\' 5"  (1.651 m)   BMI 34.11 kg/m  GENERAL: alert, oriented, appears well and in no acute distress  NECK: normal movements of the head and neck  LUNGS: on inspection no signs of respiratory distress, breathing rate appears normal, no obvious gross SOB, gasping or wheezing, no conversational dyspnea  CV: no obvious cyanosis  MS: moves all visible extremities without noticeable abnormality  PSYCH/NEURO: pleasant and cooperative, speech and thought processing grossly intact   ASSESSMENT AND PLAN:  1. Closed fracture of left tibia and fibula, sequela - stepped awkwardly off a curb and suffered a Lt tib/fib fracture, s/p surgical repair on 02/22/20 - has appt with [facility] Dr. Doran Durand on 5/24 for post-surgical f/u and she will bring hospital records with her to that appt Rx: - HYDROcodone-acetaminophen (Relampago) 10-325 MG tablet; Take 1 tablet by mouth every 4 (four) hours as needed for up to 7 days.  Dispense: 20 tablet; Refill: 0 - f/u PRN Discussed plan and reviewed medications with patient, including risks, benefits, and potential side effects. Pt expressed understand. All questions answered.      I discussed the assessment and treatment plan with the patient. The patient was provided an opportunity to ask questions and all were answered. The  patient agreed with the plan and demonstrated an understanding of the instructions.   The patient was advised to call back or seek an in-person evaluation if the symptoms worsen or if the condition fails to improve as anticipated.   Letta Median, DO

## 2020-05-06 ENCOUNTER — Other Ambulatory Visit: Payer: Self-pay

## 2020-05-06 ENCOUNTER — Encounter: Payer: Self-pay | Admitting: Physical Therapy

## 2020-05-06 ENCOUNTER — Ambulatory Visit: Payer: 59 | Attending: Student | Admitting: Physical Therapy

## 2020-05-06 DIAGNOSIS — R252 Cramp and spasm: Secondary | ICD-10-CM

## 2020-05-06 DIAGNOSIS — R262 Difficulty in walking, not elsewhere classified: Secondary | ICD-10-CM | POA: Diagnosis present

## 2020-05-06 DIAGNOSIS — M25672 Stiffness of left ankle, not elsewhere classified: Secondary | ICD-10-CM | POA: Diagnosis present

## 2020-05-06 DIAGNOSIS — M25572 Pain in left ankle and joints of left foot: Secondary | ICD-10-CM | POA: Diagnosis not present

## 2020-05-07 NOTE — Therapy (Signed)
[place] [address] [place], [place], [address] Phone: [phone]   Fax:  [phone]  Physical Therapy Evaluation  Patient Details  Name: [name] MRN: [id number] Date of Birth: 1966/03/18 Referring Provider (PT): Linna Darner   Encounter Date: 05/06/2020   PT End of Session - 05/07/20 1044    Visit Number 1    Date for PT Re-Evaluation 07/07/20    PT Start Time 1615    PT Stop Time 1700    PT Time Calculation (min) 45 min    Activity Tolerance Patient tolerated treatment well;Patient limited by pain    Behavior During Therapy [facility] for tasks assessed/performed           Past Medical History:  Diagnosis Date  . Allergy   . Dizziness   . Gastroparesis    [facility]  . Migraine    [facility]  . PONV (postoperative nausea and vomiting)   . Weight decrease     Past Surgical History:  Procedure Laterality Date  . ABDOMINAL HYSTERECTOMY    . abdominalplasty     s/p weight loss  . BLOOD PATCH  2019  . brachiplasty     s/p weight loss   . CHOLECYSTECTOMY  09/04/2012   Procedure: LAPAROSCOPIC CHOLECYSTECTOMY;  Surgeon: Harl Bowie, MD;  Location: [facility];  Service: General;  Laterality: N/A;  . COLONOSCOPY  08/21/12   few scattered sigmoid diverticula: one sessile right colon polyp removed by cold biopsies x2:  patchy erythema in the left colon multiple random biopsies done:  normal therminal ileum  . COSMETIC SURGERY    . KNEE ARTHROSCOPY W/ ACL RECONSTRUCTION     in early 1990's  . LUMBAR PUNCTURE  2019  . TONSILLECTOMY      There were no vitals filed for this visit.    Subjective Assessment - 05/06/20 1620    Subjective Pt reports trimalleolar closed fx of the L ankle on 02/21/2020. Pt had surgery on 02/22/2020. Pt injured foot when stepping off a curb while on a trip to [facility]. Pt reports that she is full WB per MD; states that it is still very painful to put full  weight on L foot. Pt reports she is also having B knee pain which was limiting her ability to stand/walk prior to injury. Pt ambulating with RW today; reports she did not use AD prior to injury.    Pertinent History pt report early onset dementia; B knee OA    Limitations Standing;Walking;House hold activities    Diagnostic tests xrays    Patient Stated Goals have less pain and be able to do all activities without pain    Currently in Pain? Yes    Pain Score 5     Pain Location Ankle    Pain Orientation Left    Pain Descriptors / Indicators Aching;Sharp    Pain Type Acute pain;Surgical pain    Pain Onset More than a month ago    Pain Frequency Intermittent    Aggravating Factors  walking, standing, WB activities, ankle ROM    Pain Relieving Factors ibuprofen, rest, ice              [facility] PT Assessment - 05/07/20 0001      Assessment   Medical Diagnosis L trimalleolar ankle fx    Referring Provider (PT) Jusin Ollis    Onset Date/Surgical Date 02/21/20    Prior Therapy PT for shoulder  Precautions   Precautions None      Restrictions   Weight Bearing Restrictions No      Balance Screen   Has the patient fallen in the past 6 months Yes    How many times? 1    Has the patient had a decrease in activity level because of a fear of falling?  No    Is the patient reluctant to leave their home because of a fear of falling?  No      Home Environment   Home Access Stairs to enter    Home Layout Two level    Additional Comments do housework      Prior Function   Level of Independence Independent with community mobility without device    Vocation Unemployed;Self employed    Vocation Requirements some book keeping    Leisure walks the dog      Observation/Other Assessments-Edema    Edema Figure 8   equivalent on B ankle     Sensation   Light Touch --   impaired around surgical site     Functional Tests   Functional tests Sit to Stand      Sit to Stand   Comments  leaning to R; not equal WB      Posture/Postural Control   Posture Comments leaning to R and excessive WB through UE with standing activties      ROM / Strength   AROM / PROM / Strength AROM;PROM      AROM   AROM Assessment Site Ankle    Right/Left Ankle Left    Left Ankle Dorsiflexion -15    Left Ankle Plantar Flexion 45    Left Ankle Inversion 10    Left Ankle Eversion 10      PROM   PROM Assessment Site Ankle    Right/Left Ankle Left    Left Ankle Dorsiflexion -10    Left Ankle Plantar Flexion 45    Left Ankle Inversion 15    Left Ankle Eversion 20      Strength   Overall Strength Comments MMT testing next rx      Palpation   Palpation comment tender to palpation gastroc      Transfers   Five time sit to stand comments  leaning to R LE    Comments antalgic gait with excessive leaning on UE with RW                      Objective measurements completed on examination: See above findings.               PT Education - 05/07/20 1044    Education Details Pt educated on POC and HEP    Person(s) Educated Patient    Methods Explanation;Demonstration;Handout    Comprehension Verbalized understanding;Returned demonstration            PT Short Term Goals - 05/07/20 1055      PT SHORT TERM GOAL #1   Title Patient will be independent with home exercise program for self management.    Time 2    Period Weeks    Status New    Target Date 05/21/20             PT Long Term Goals - 05/07/20 1056      PT LONG TERM GOAL #1   Title Pt will demonstrate gait WFL without AD and equal WB    Time 8    Period Weeks  Status New    Target Date 07/02/20      PT LONG TERM GOAL #2   Title Pt will demo L ankle ROM equivalent to R ankle    Time 8    Period Weeks    Status New    Target Date 07/02/20      PT LONG TERM GOAL #3   Title Pt will demo L ankle pain decreased by 50%    Time 8    Period Weeks    Status New    Target Date 07/02/20       PT LONG TERM GOAL #4   Title Pt will demo SLS on L LE for 10 sec with no increase in pain    Time 8    Period Weeks    Status New    Target Date 07/02/20                  Plan - 05/07/20 1045    Clinical Impression Statement Pt presents to clinic s/p trimalleolar fx of L ankle on 02/21/20 and subsequent surgery on 02/22/2020 after stepping off curb at [facility]. Pt reports she is full weightbearing per MD. Pt was given air cast to wear but reports blister on L ankle that makes it very painful to have on; reports to clinic with ankle brace instead. Pt demos decreased ROM of L foot, sensation deficits L ankle, pain with palpation to medial gastroc. Pt ambulating into clinic with RW and antalgic gait; unable to weight shift onto L foot without putting excessive weight through UEs on walker. Pt would benefit from skilled PT to address the above deficits.    Personal Factors and Comorbidities Comorbidity 2    Comorbidities migraine headaches, dizziness    Examination-Activity Limitations Squat;Stairs;Stand;Locomotion Level    Examination-Participation Restrictions Community Activity;Interpersonal Relationship    Stability/Clinical Decision Making Evolving/Moderate complexity    Clinical Decision Making Moderate    Rehab Potential Good    PT Frequency 2x / week    PT Duration 8 weeks    PT Treatment/Interventions ADLs/Self Care Home Management;Cryotherapy;Electrical Stimulation;Iontophoresis 4mg /ml Dexamethasone;Ultrasound;Therapeutic activities;Therapeutic exercise;Neuromuscular re-education;Manual techniques;Patient/family education;Dry needling;Gait training;Stair training;Functional mobility training;Balance training;Passive range of motion;Scar mobilization;Taping;Vasopneumatic Device    PT Next Visit Plan TUG, initiate gait, ROM, and LE strength    PT Home Exercise Plan ankle ROM, seated heel/toe raises, STS, standing weight shifts    Consulted and Agree with Plan of Care Patient            Patient will benefit from skilled therapeutic intervention in order to improve the following deficits and impairments:  Decreased range of motion, Difficulty walking, Increased muscle spasms, Decreased activity tolerance, Pain, Decreased balance, Decreased mobility, Decreased strength  Visit Diagnosis: Pain in left ankle and joints of left foot  Stiffness of left ankle, not elsewhere classified  Cramp and spasm  Difficulty in walking, not elsewhere classified     Problem List Patient Active Problem List   Diagnosis Date Noted  . Acute maxillary sinusitis 10/18/2019  . Memory loss 06/12/2018  . Biliary dyskinesia 08/28/2012  . Tobacco use 07/02/2012  . Migraine headache 07/02/2012   Amador Cunas, PT, DPT Donald Prose Andrez Lieurance 05/07/2020, 11:14 AM  [place] [address] [place], [place], [address] Phone: [phone]   Fax:  [phone]  Name: NYEMA HACHEY MRN: [id number] Date of Birth: Mar 01, 1966

## 2020-05-13 ENCOUNTER — Ambulatory Visit: Payer: 59 | Admitting: Physical Therapy

## 2020-05-15 ENCOUNTER — Ambulatory Visit: Payer: 59 | Attending: Student | Admitting: Physical Therapy

## 2020-05-15 ENCOUNTER — Other Ambulatory Visit: Payer: Self-pay

## 2020-05-15 ENCOUNTER — Encounter: Payer: Self-pay | Admitting: Physical Therapy

## 2020-05-15 DIAGNOSIS — M25572 Pain in left ankle and joints of left foot: Secondary | ICD-10-CM | POA: Insufficient documentation

## 2020-05-15 DIAGNOSIS — M25672 Stiffness of left ankle, not elsewhere classified: Secondary | ICD-10-CM | POA: Diagnosis not present

## 2020-05-15 DIAGNOSIS — M25512 Pain in left shoulder: Secondary | ICD-10-CM | POA: Diagnosis present

## 2020-05-15 DIAGNOSIS — R262 Difficulty in walking, not elsewhere classified: Secondary | ICD-10-CM | POA: Diagnosis present

## 2020-05-15 DIAGNOSIS — R252 Cramp and spasm: Secondary | ICD-10-CM | POA: Diagnosis present

## 2020-05-15 NOTE — Therapy (Signed)
[place], [place], [address] Phone: [phone]   Fax:  [phone]  Physical Therapy Treatment  Patient Details  Name: [name] MRN: [id number] Date of Birth: 1966-06-17 Referring Provider (PT): Linna Darner   Encounter Date: 05/15/2020   PT End of Session - 05/15/20 1514    Visit Number 2    Date for PT Re-Evaluation 07/07/20    PT Start Time 1430    PT Stop Time 1521    PT Time Calculation (min) 51 min    Activity Tolerance Patient limited by pain    Behavior During Therapy [facility] for tasks assessed/performed           Past Medical History:  Diagnosis Date  . Allergy   . Dizziness   . Gastroparesis    [facility]  . Migraine    [facility]  . PONV (postoperative nausea and vomiting)   . Weight decrease     Past Surgical History:  Procedure Laterality Date  . ABDOMINAL HYSTERECTOMY    . abdominalplasty     s/p weight loss  . BLOOD PATCH  2019  . brachiplasty     s/p weight loss   . CHOLECYSTECTOMY  09/04/2012   Procedure: LAPAROSCOPIC CHOLECYSTECTOMY;  Surgeon: Harl Bowie, MD;  Location: [facility];  Service: General;  Laterality: N/A;  . COLONOSCOPY  08/21/12   few scattered sigmoid diverticula: one sessile right colon polyp removed by cold biopsies x2:  patchy erythema in the left colon multiple random biopsies done:  normal therminal ileum  . COSMETIC SURGERY    . KNEE ARTHROSCOPY W/ ACL RECONSTRUCTION     in early 1990's  . LUMBAR PUNCTURE  2019  . TONSILLECTOMY      There were no vitals filed for this visit.   Subjective Assessment - 05/15/20 1426    Subjective Pt reports a bad day today seeing [facility] neurologist "Just ok, no matter what I use it hurts to walk"    Currently in Pain? Yes   when she puts weight on it   Pain Score 8     Pain Location Ankle    Pain Orientation Left                             [facility] Adult PT  Treatment/Exercise - 05/15/20 0001      Exercises   Exercises Ankle      Modalities   Modalities Cryotherapy;Electrical Stimulation      Cryotherapy   Number Minutes Cryotherapy 12 Minutes    Cryotherapy Location Ankle    Type of Cryotherapy Ice pack      Electrical Stimulation   Electrical Stimulation Location L ankle    Electrical Stimulation Action pre mod    Electrical Stimulation Parameters supine    Electrical Stimulation Goals Pain      Manual Therapy   Manual Therapy Passive ROM    Passive ROM L ankle all motions      Ankle Exercises: Aerobic   Nustep L4 x 6 min      Ankle Exercises: Standing   Other Standing Ankle Exercises Standing marches UE and therapist assist.    Other Standing Ankle Exercises Standing wt shifts      Ankle Exercises: Seated   ABC's 1 rep    Ankle Circles/Pumps AROM;Left;20 reps    Other Seated Ankle Exercises 4 way PRE's red  x 10 each         LAQ LLE x10           PT Short Term Goals - 05/07/20 1055      PT SHORT TERM GOAL #1   Title Patient will be independent with home exercise program for self management.    Time 2    Period Weeks    Status New    Target Date 05/21/20             PT Long Term Goals - 05/07/20 1056      PT LONG TERM GOAL #1   Title Pt will demonstrate gait WFL without AD and equal WB    Time 8    Period Weeks    Status New    Target Date 07/02/20      PT LONG TERM GOAL #2   Title Pt will demo L ankle ROM equivalent to R ankle    Time 8    Period Weeks    Status New    Target Date 07/02/20      PT LONG TERM GOAL #3   Title Pt will demo L ankle pain decreased by 50%    Time 8    Period Weeks    Status New    Target Date 07/02/20      PT LONG TERM GOAL #4   Title Pt will demo SLS on L LE for 10 sec with no increase in pain    Time 8    Period Weeks    Status New    Target Date 07/02/20                 Plan - 05/15/20 1514    Clinical Impression Statement L ankle pain  with all wt bearing activities, encouragement and assist needed with marches and weight shifts. Pt is very hyper verbal about things going in her life. Cues at time needed to redirect pt attentions. Pain with passive and active inversion.    Personal Factors and Comorbidities Comorbidity 2    Comorbidities migraine headaches, dizziness    Examination-Activity Limitations Squat;Stairs;Stand;Locomotion Level    Examination-Participation Restrictions Community Activity;Interpersonal Relationship    Stability/Clinical Decision Making Evolving/Moderate complexity    Rehab Potential Good    PT Duration 8 weeks    PT Treatment/Interventions ADLs/Self Care Home Management;Cryotherapy;Electrical Stimulation;Iontophoresis 4mg /ml Dexamethasone;Ultrasound;Therapeutic activities;Therapeutic exercise;Neuromuscular re-education;Manual techniques;Patient/family education;Dry needling;Gait training;Stair training;Functional mobility training;Balance training;Passive range of motion;Scar mobilization;Taping;Vasopneumatic Device    PT Next Visit Plan TUG, initiate gait, ROM, and LE strength           Patient will benefit from skilled therapeutic intervention in order to improve the following deficits and impairments:  Decreased range of motion, Difficulty walking, Increased muscle spasms, Decreased activity tolerance, Pain, Decreased balance, Decreased mobility, Decreased strength  Visit Diagnosis: No diagnosis found.     Problem List Patient Active Problem List   Diagnosis Date Noted  . Acute maxillary sinusitis 10/18/2019  . Memory loss 06/12/2018  . Biliary dyskinesia 08/28/2012  . Tobacco use 07/02/2012  . Migraine headache 07/02/2012    Scot Jun 05/15/2020, 3:25 PM  [place] [address] [place], [place], [address] Phone: [phone]   Fax:  [phone]  Name: [name] MRN: [id number] Date of Birth:  [birth year]

## 2020-05-19 ENCOUNTER — Other Ambulatory Visit: Payer: Self-pay

## 2020-05-19 ENCOUNTER — Encounter: Payer: Self-pay | Admitting: Physical Therapy

## 2020-05-19 ENCOUNTER — Ambulatory Visit: Payer: 59 | Admitting: Physical Therapy

## 2020-05-19 DIAGNOSIS — M25672 Stiffness of left ankle, not elsewhere classified: Secondary | ICD-10-CM

## 2020-05-19 DIAGNOSIS — R262 Difficulty in walking, not elsewhere classified: Secondary | ICD-10-CM

## 2020-05-19 DIAGNOSIS — M25572 Pain in left ankle and joints of left foot: Secondary | ICD-10-CM

## 2020-05-19 DIAGNOSIS — R252 Cramp and spasm: Secondary | ICD-10-CM

## 2020-05-19 NOTE — Therapy (Signed)
[place], [place], [address] Phone: [phone]   Fax:  [phone]  Physical Therapy Treatment  Patient Details  Name: [name] MRN: [id number] Date of Birth: [birth year] Referring Provider (PT): Linna Darner   Encounter Date: 05/19/2020   PT End of Session - 05/19/20 1623    Visit Number 3    Date for PT Re-Evaluation 07/07/20    PT Start Time 5366    PT Stop Time 1615    PT Time Calculation (min) 40 min    Activity Tolerance Patient limited by pain    Behavior During Therapy [facility] for tasks assessed/performed           Past Medical History:  Diagnosis Date  . Allergy   . Dizziness   . Gastroparesis    [facility]  . Migraine    [facility]  . PONV (postoperative nausea and vomiting)   . Weight decrease     Past Surgical History:  Procedure Laterality Date  . ABDOMINAL HYSTERECTOMY    . abdominalplasty     s/p weight loss  . BLOOD PATCH  2019  . brachiplasty     s/p weight loss   . CHOLECYSTECTOMY  09/04/2012   Procedure: LAPAROSCOPIC CHOLECYSTECTOMY;  Surgeon: Harl Bowie, MD;  Location: [facility];  Service: General;  Laterality: N/A;  . COLONOSCOPY  08/21/12   few scattered sigmoid diverticula: one sessile right colon polyp removed by cold biopsies x2:  patchy erythema in the left colon multiple random biopsies done:  normal therminal ileum  . COSMETIC SURGERY    . KNEE ARTHROSCOPY W/ ACL RECONSTRUCTION     in early 1990's  . LUMBAR PUNCTURE  2019  . TONSILLECTOMY      There were no vitals filed for this visit.   Subjective Assessment - 05/19/20 1623    Subjective Pt reports feeling slightly better; ambulates into clinic with SPC. States it still hurts to walk.    Currently in Pain? Yes    Pain Score 6     Pain Location Ankle    Pain Orientation Left                             [facility] Adult PT Treatment/Exercise - 05/19/20 0001       Transfers   Comments gait x75 ft around clinic with [facility]; cues for forward flexed posture, equal WB, and R lateral lean      Ankle Exercises: Aerobic   Nustep L5 x 7 min      Ankle Exercises: Seated   ABC's 1 rep    Heel Raises Both;15 reps    Toe Raise 15 reps    Heel Slides 10 reps   red TB   Other Seated Ankle Exercises dyna disc seated with weight through LLE DF/PF and inv/ev x15 each direction    Other Seated Ankle Exercises STS 2x5 with elevated LE on dynadisc      Ankle Exercises: Standing   Other Standing Ankle Exercises Standing wt shifts                    PT Short Term Goals - 05/19/20 1627      PT SHORT TERM GOAL #1   Title Patient will be independent with home exercise program for self management.    Time 2    Period Weeks  Status Achieved    Target Date 05/21/20             PT Long Term Goals - 05/19/20 1627      PT LONG TERM GOAL #1   Title Pt will demonstrate gait WFL without AD and equal WB    Baseline using SPC; decreased stance time on LLE    Time 8    Period Weeks    Status On-going      PT LONG TERM GOAL #2   Title Pt will demo L ankle ROM equivalent to R ankle    Time 8    Period Weeks    Status On-going      PT LONG TERM GOAL #3   Title Pt will demo L ankle pain decreased by 50%    Time 8    Period Weeks    Status On-going      PT LONG TERM GOAL #4   Title Pt will demo SLS on L LE for 10 sec with no increase in pain    Baseline unable to maintain SLS >1sec on LLE    Time 8    Period Weeks    Status On-going                 Plan - 05/19/20 1624    Clinical Impression Statement Pt ambulates into clinic with [facility] today; antalgic gait with lateral lean to RLE and needed cues for forward flexed posture. Pt reports pain with standing weight shifts but reports able to tolerate better than last rx. Hesitant with standing WB ex's.    PT Treatment/Interventions ADLs/Self Care Home Management;Cryotherapy;Electrical  Stimulation;Iontophoresis 4mg /ml Dexamethasone;Ultrasound;Therapeutic activities;Therapeutic exercise;Neuromuscular re-education;Manual techniques;Patient/family education;Dry needling;Gait training;Stair training;Functional mobility training;Balance training;Passive range of motion;Scar mobilization;Taping;Vasopneumatic Device    PT Next Visit Plan gait training with LRAD, ROM, LE strength    Consulted and Agree with Plan of Care Patient           Patient will benefit from skilled therapeutic intervention in order to improve the following deficits and impairments:  Decreased range of motion, Difficulty walking, Increased muscle spasms, Decreased activity tolerance, Pain, Decreased balance, Decreased mobility, Decreased strength  Visit Diagnosis: Stiffness of left ankle, not elsewhere classified  Cramp and spasm  Difficulty in walking, not elsewhere classified  Pain in left ankle and joints of left foot     Problem List Patient Active Problem List   Diagnosis Date Noted  . Acute maxillary sinusitis 10/18/2019  . Memory loss 06/12/2018  . Biliary dyskinesia 08/28/2012  . Tobacco use 07/02/2012  . Migraine headache 07/02/2012   Amador Cunas, PT, DPT Donald Prose Jaloni Sorber 05/19/2020, 4:28 PM  [address] [address] [place], [place], [address] Phone: [phone]   Fax:  [phone]  Name: ZELLIE JENNING MRN: [id number] Date of Birth: Mar 13, 1966

## 2020-05-26 ENCOUNTER — Other Ambulatory Visit: Payer: Self-pay

## 2020-05-26 ENCOUNTER — Encounter: Payer: Self-pay | Admitting: Physical Therapy

## 2020-05-26 ENCOUNTER — Ambulatory Visit: Payer: 59 | Admitting: Physical Therapy

## 2020-05-26 DIAGNOSIS — M25512 Pain in left shoulder: Secondary | ICD-10-CM

## 2020-05-26 DIAGNOSIS — M25672 Stiffness of left ankle, not elsewhere classified: Secondary | ICD-10-CM | POA: Diagnosis not present

## 2020-05-26 DIAGNOSIS — R252 Cramp and spasm: Secondary | ICD-10-CM

## 2020-05-26 DIAGNOSIS — R262 Difficulty in walking, not elsewhere classified: Secondary | ICD-10-CM

## 2020-05-26 DIAGNOSIS — M25572 Pain in left ankle and joints of left foot: Secondary | ICD-10-CM

## 2020-05-26 NOTE — Therapy (Signed)
[place] [address] [place], [place], [address] Phone: [phone]   Fax:  [phone]  Physical Therapy Treatment  Patient Details  Name: [name] MRN: [id number] Date of Birth: May 12, 1966 Referring Provider (PT): Linna Darner   Encounter Date: 05/26/2020   PT End of Session - 05/26/20 1642    Visit Number 4    Date for PT Re-Evaluation 07/07/20    PT Start Time 1600    PT Stop Time 1642    PT Time Calculation (min) 42 min    Activity Tolerance Patient tolerated treatment well    Behavior During Therapy [facility] for tasks assessed/performed           Past Medical History:  Diagnosis Date  . Allergy   . Dizziness   . Gastroparesis    [facility]  . Migraine    [facility]  . PONV (postoperative nausea and vomiting)   . Weight decrease     Past Surgical History:  Procedure Laterality Date  . ABDOMINAL HYSTERECTOMY    . abdominalplasty     s/p weight loss  . BLOOD PATCH  2019  . brachiplasty     s/p weight loss   . CHOLECYSTECTOMY  09/04/2012   Procedure: LAPAROSCOPIC CHOLECYSTECTOMY;  Surgeon: Harl Bowie, MD;  Location: [place];  Service: General;  Laterality: N/A;  . COLONOSCOPY  08/21/12   few scattered sigmoid diverticula: one sessile right colon polyp removed by cold biopsies x2:  patchy erythema in the left colon multiple random biopsies done:  normal therminal ileum  . COSMETIC SURGERY    . KNEE ARTHROSCOPY W/ ACL RECONSTRUCTION     in early 1990's  . LUMBAR PUNCTURE  2019  . TONSILLECTOMY      There were no vitals filed for this visit.   Subjective Assessment - 05/26/20 1604    Subjective Foot feels good today, knee is the main issue.    Currently in Pain? No/denies    Pain Score 5     Pain Location Knee    Pain Orientation Left                             [facility] Adult PT Treatment/Exercise - 05/26/20 0001      Manual Therapy   Manual Therapy  Passive ROM    Manual therapy comments some PROM taken to end range and held    Passive ROM L ankle all motions      Ankle Exercises: Aerobic   Recumbent Bike L0 x3 min     Nustep L4 x 6 min      Ankle Exercises: Seated   ABC's 1 rep    Ankle Circles/Pumps AROM;Left;20 reps    Other Seated Ankle Exercises dyna disc seated with weight through LLE DF/PF and inv/ev x15 each direction    Other Seated Ankle Exercises Sit to stand from UBE 2x10       Ankle Exercises: Standing   Other Standing Ankle Exercises heel raises 2x15; Standing marches     Other Standing Ankle Exercises Standing wt shifts                    PT Short Term Goals - 05/19/20 1627      PT SHORT TERM GOAL #1   Title Patient will be independent with home exercise program for self management.    Time 2  Period Weeks    Status Achieved    Target Date 05/21/20             PT Long Term Goals - 05/19/20 1627      PT LONG TERM GOAL #1   Title Pt will demonstrate gait WFL without AD and equal WB    Baseline using SPC; decreased stance time on LLE    Time 8    Period Weeks    Status On-going      PT LONG TERM GOAL #2   Title Pt will demo L ankle ROM equivalent to R ankle    Time 8    Period Weeks    Status On-going      PT LONG TERM GOAL #3   Title Pt will demo L ankle pain decreased by 50%    Time 8    Period Weeks    Status On-going      PT LONG TERM GOAL #4   Title Pt will demo SLS on L LE for 10 sec with no increase in pain    Baseline unable to maintain SLS >1sec on LLE    Time 8    Period Weeks    Status On-going                 Plan - 05/26/20 1643    Clinical Impression Statement PT ambulate in with heavy lean on SPC avoiding weight on her LLE. Pt states her foot feel fine but her knee is giving her the most issues. She did well tolerating MT without much pain or guarding. Cues to complete full ROM with 4 way Tband ankle exercises. Pt did well with Wt shifts to her L side  with therapist blocking L knee she was able to lift RLE from floor.    Comorbidities migraine headaches, dizziness    Examination-Activity Limitations Squat;Stairs;Stand;Locomotion Level    Examination-Participation Restrictions Community Activity;Interpersonal Relationship    Stability/Clinical Decision Making Evolving/Moderate complexity    Rehab Potential Good    PT Frequency 2x / week    PT Duration 8 weeks    PT Treatment/Interventions ADLs/Self Care Home Management;Cryotherapy;Electrical Stimulation;Iontophoresis 4mg /ml Dexamethasone;Ultrasound;Therapeutic activities;Therapeutic exercise;Neuromuscular re-education;Manual techniques;Patient/family education;Dry needling;Gait training;Stair training;Functional mobility training;Balance training;Passive range of motion;Scar mobilization;Taping;Vasopneumatic Device    PT Next Visit Plan gait training with LRAD, ROM, LE strength           Patient will benefit from skilled therapeutic intervention in order to improve the following deficits and impairments:     Visit Diagnosis: Difficulty in walking, not elsewhere classified  Cramp and spasm  Stiffness of left ankle, not elsewhere classified  Pain in left ankle and joints of left foot  Acute pain of left shoulder     Problem List Patient Active Problem List   Diagnosis Date Noted  . Acute maxillary sinusitis 10/18/2019  . Memory loss 06/12/2018  . Biliary dyskinesia 08/28/2012  . Tobacco use 07/02/2012  . Migraine headache 07/02/2012    Scot Jun, PTA 05/26/2020, 4:46 PM  [address] [address] [place] [place], [place], [address] Phone: [phone]   Fax:  [phone]  Name: [name] MRN: [id number] Date of Birth: [birth year]

## 2020-05-28 ENCOUNTER — Encounter: Payer: 59 | Admitting: Physical Therapy

## 2020-05-30 ENCOUNTER — Ambulatory Visit: Payer: 59 | Admitting: Physical Therapy

## 2020-05-30 ENCOUNTER — Other Ambulatory Visit: Payer: Self-pay

## 2020-05-30 ENCOUNTER — Encounter: Payer: Self-pay | Admitting: Physical Therapy

## 2020-05-30 DIAGNOSIS — M25672 Stiffness of left ankle, not elsewhere classified: Secondary | ICD-10-CM | POA: Diagnosis not present

## 2020-05-30 DIAGNOSIS — M25572 Pain in left ankle and joints of left foot: Secondary | ICD-10-CM

## 2020-05-30 DIAGNOSIS — R252 Cramp and spasm: Secondary | ICD-10-CM

## 2020-05-30 DIAGNOSIS — R262 Difficulty in walking, not elsewhere classified: Secondary | ICD-10-CM

## 2020-05-30 NOTE — Therapy (Signed)
[place], [place], [address] Phone: [phone]   Fax:  [phone]  Physical Therapy Treatment  Patient Details  Name: [name] MRN: [id number] Date of Birth: 09/30/1966 Referring Provider (PT): Linna Darner   Encounter Date: 05/30/2020   PT End of Session - 05/30/20 1146    Visit Number 5    Date for PT Re-Evaluation 07/07/20    PT Start Time 1100    PT Stop Time 1145    PT Time Calculation (min) 45 min    Activity Tolerance Patient tolerated treatment well    Behavior During Therapy [facility] for tasks assessed/performed           Past Medical History:  Diagnosis Date  . Allergy   . Dizziness   . Gastroparesis    [facility]  . Migraine    [facility]  . PONV (postoperative nausea and vomiting)   . Weight decrease     Past Surgical History:  Procedure Laterality Date  . ABDOMINAL HYSTERECTOMY    . abdominalplasty     s/p weight loss  . BLOOD PATCH  2019  . brachiplasty     s/p weight loss   . CHOLECYSTECTOMY  09/04/2012   Procedure: LAPAROSCOPIC CHOLECYSTECTOMY;  Surgeon: Harl Bowie, MD;  Location: [place];  Service: General;  Laterality: N/A;  . COLONOSCOPY  08/21/12   few scattered sigmoid diverticula: one sessile right colon polyp removed by cold biopsies x2:  patchy erythema in the left colon multiple random biopsies done:  normal therminal ileum  . COSMETIC SURGERY    . KNEE ARTHROSCOPY W/ ACL RECONSTRUCTION     in early 1990's  . LUMBAR PUNCTURE  2019  . TONSILLECTOMY      There were no vitals filed for this visit.   Subjective Assessment - 05/30/20 1103    Subjective Pt states knee is primary concern today; hurting more than the ankle lately    Currently in Pain? Yes    Pain Score 8     Pain Location Knee    Pain Orientation Left    Multiple Pain Sites Yes    Pain Score 4    Pain Location Ankle    Pain Orientation Left               [facility] PT Assessment - 05/30/20 0001      Assessment   Next MD Visit 06/02/2020      ROM / Strength   AROM / PROM / Strength Strength      AROM   Left Ankle Dorsiflexion -10    Left Ankle Plantar Flexion 40    Left Ankle Inversion 20    Left Ankle Eversion 10      PROM   Left Ankle Dorsiflexion 5    Left Ankle Plantar Flexion 40    Left Ankle Inversion 25    Left Ankle Eversion 20      Strength   Overall Strength Comments 4/5 resisted L ankle eversion with some pain; 5/5 otherwise    Strength Assessment Site --    Right/Left Knee --      Palpation   Palpation comment very tender over L lateral malleous; otherwise [facility]                         [facility] Adult PT Treatment/Exercise - 05/30/20 0001      Transfers  Comments TUG x12 sec with SPC and without AD; lateral lean to RLE, decreased arm swing, antalgic gait      Ankle Exercises: Aerobic   Recumbent Bike x6 min    Nustep L5 x 7 min      Ankle Exercises: Seated   ABC's 1 rep    Other Seated Ankle Exercises dyna disc seated with weight through LLE DF/PF and inv/ev x15 each direction; LAQ with ball sqeeze x10 B    Other Seated Ankle Exercises Sit to stand from UBE 2x10       Ankle Exercises: Standing   Other Standing Ankle Exercises heel raises 2x15; Standing marches; hip abduction/extension    Other Standing Ankle Exercises Standing wt shifts                    PT Short Term Goals - 05/19/20 1627      PT SHORT TERM GOAL #1   Title Patient will be independent with home exercise program for self management.    Time 2    Period Weeks    Status Achieved    Target Date 05/21/20             PT Long Term Goals - 05/30/20 1107      PT LONG TERM GOAL #1   Title Pt will demonstrate gait WFL without AD and equal WB    Baseline using SPC; leaning to RLE and excessvie WB through RUE    Time 8    Period Weeks    Status On-going      PT LONG TERM GOAL #2   Title Pt will demo L ankle ROM  equivalent to R ankle    Time 8    Period Weeks    Status On-going      PT LONG TERM GOAL #3   Title Pt will demo L ankle pain decreased by 50%    Time 8    Period Weeks    Status Achieved      PT LONG TERM GOAL #4   Title Pt will demo SLS on L LE for 10 sec with no increase in pain    Baseline unable to maintain SLS >1sec on LLE    Time 8    Period Weeks    Status On-going                 Plan - 05/30/20 1146    Clinical Impression Statement Pt has made good progress with L ankle ROM and L ankle strength. Still has some ankle pain with resisted ankle eversion and palpation to lateral ankle. With WB ex's pt localizes majority of pain to L knee and reports L knee as primary complaint. Pt demos increased lateral lean to RLE, decreased arm swing, and antalgic gait. Pt L knee pain continues to be limited factor in rehab process.    PT Treatment/Interventions ADLs/Self Care Home Management;Cryotherapy;Electrical Stimulation;Iontophoresis 4mg /ml Dexamethasone;Ultrasound;Therapeutic activities;Therapeutic exercise;Neuromuscular re-education;Manual techniques;Patient/family education;Dry needling;Gait training;Stair training;Functional mobility training;Balance training;Passive range of motion;Scar mobilization;Taping;Vasopneumatic Device    PT Next Visit Plan gait training with LRAD, ROM, LE strength    Consulted and Agree with Plan of Care Patient           Patient will benefit from skilled therapeutic intervention in order to improve the following deficits and impairments:  Decreased range of motion, Difficulty walking, Increased muscle spasms, Decreased activity tolerance, Pain, Decreased balance, Decreased mobility, Decreased strength  Visit Diagnosis: Difficulty in walking, not elsewhere classified  Cramp and spasm  Stiffness of left ankle, not elsewhere classified  Pain in left ankle and joints of left foot     Problem List Patient Active Problem List   Diagnosis  Date Noted  . Acute maxillary sinusitis 10/18/2019  . Memory loss 06/12/2018  . Biliary dyskinesia 08/28/2012  . Tobacco use 07/02/2012  . Migraine headache 07/02/2012   Amador Cunas, PT, DPT [place]se Roby [place]away 05/30/2020, 11:48 AM  [address] [address] [place], [place], [address] Phone: [phone]   Fax:  [phone]  Name: [name] MRN: [id number] Date of Birth: 1966-06-13

## 2020-06-03 ENCOUNTER — Encounter: Payer: Self-pay | Admitting: Physical Therapy

## 2020-06-03 ENCOUNTER — Ambulatory Visit: Payer: 59 | Admitting: Physical Therapy

## 2020-06-03 ENCOUNTER — Other Ambulatory Visit: Payer: Self-pay

## 2020-06-03 DIAGNOSIS — M25672 Stiffness of left ankle, not elsewhere classified: Secondary | ICD-10-CM

## 2020-06-03 DIAGNOSIS — R252 Cramp and spasm: Secondary | ICD-10-CM

## 2020-06-03 DIAGNOSIS — R262 Difficulty in walking, not elsewhere classified: Secondary | ICD-10-CM

## 2020-06-03 DIAGNOSIS — M25512 Pain in left shoulder: Secondary | ICD-10-CM

## 2020-06-03 DIAGNOSIS — M25572 Pain in left ankle and joints of left foot: Secondary | ICD-10-CM

## 2020-06-03 NOTE — Therapy (Signed)
[place], [place], [address] Phone: [phone]   Fax:  [phone]  Physical Therapy Treatment  Patient Details  Name: [name] MRN: [id number] Date of Birth: Aug 14, 1966 Referring Provider (PT): Linna Darner   Encounter Date: 06/03/2020   PT End of Session - 06/03/20 1142    Visit Number 6    Date for PT Re-Evaluation 07/07/20    PT Start Time 1100    PT Stop Time 1143    PT Time Calculation (min) 43 min    Activity Tolerance Patient tolerated treatment well    Behavior During Therapy [facility] for tasks assessed/performed           Past Medical History:  Diagnosis Date  . Allergy   . Dizziness   . Gastroparesis    [facility]  . Migraine    [facility]  . PONV (postoperative nausea and vomiting)   . Weight decrease     Past Surgical History:  Procedure Laterality Date  . ABDOMINAL HYSTERECTOMY    . abdominalplasty     s/p weight loss  . BLOOD PATCH  2019  . brachiplasty     s/p weight loss   . CHOLECYSTECTOMY  09/04/2012   Procedure: LAPAROSCOPIC CHOLECYSTECTOMY;  Surgeon: Harl Bowie, MD;  Location: [place];  Service: General;  Laterality: N/A;  . COLONOSCOPY  08/21/12   few scattered sigmoid diverticula: one sessile right colon polyp removed by cold biopsies x2:  patchy erythema in the left colon multiple random biopsies done:  normal therminal ileum  . COSMETIC SURGERY    . KNEE ARTHROSCOPY W/ ACL RECONSTRUCTION     in early 1990's  . LUMBAR PUNCTURE  2019  . TONSILLECTOMY      There were no vitals filed for this visit.   Subjective Assessment - 06/03/20 1103    Subjective Amb in without SPC, MD was pleased, No pain    Currently in Pain? No/denies                             [facility] Adult PT Treatment/Exercise - 06/03/20 0001      Manual Therapy   Manual Therapy Passive ROM    Manual therapy comments some PROM taken to end range  and held    Passive ROM L ankle all motions      Ankle Exercises: Aerobic   Recumbent Bike x5 min    Nustep L5 x 6 min    Other Aerobic Tmill push x10 x5      Ankle Exercises: Seated   ABC's 1 rep    Other Seated Ankle Exercises 4 way PRE's blue x 15 each    Other Seated Ankle Exercises Sit to stand from UBE x10       Ankle Exercises: Standing   Other Standing Ankle Exercises heel raises 2x15 black bar; Standing marches    Other Standing Ankle Exercises resisted gait 30lb fwd/bak x3 each                    PT Short Term Goals - 05/19/20 1627      PT SHORT TERM GOAL #1   Title Patient will be independent with home exercise program for self management.    Time 2    Period Weeks    Status Achieved    Target Date 05/21/20  PT Long Term Goals - 05/30/20 1107      PT LONG TERM GOAL #1   Title Pt will demonstrate gait WFL without AD and equal WB    Baseline using SPC; leaning to RLE and excessvie WB through RUE    Time 8    Period Weeks    Status On-going      PT LONG TERM GOAL #2   Title Pt will demo L ankle ROM equivalent to R ankle    Time 8    Period Weeks    Status On-going      PT LONG TERM GOAL #3   Title Pt will demo L ankle pain decreased by 50%    Time 8    Period Weeks    Status Achieved      PT LONG TERM GOAL #4   Title Pt will demo SLS on L LE for 10 sec with no increase in pain    Baseline unable to maintain SLS >1sec on LLE    Time 8    Period Weeks    Status On-going                 Plan - 06/03/20 1143    Clinical Impression Statement L ankle pain present with resisted eversion. She was able to complete all the interventions, but complains of L knee pain throughout with WT bearing activities. She amb with a limp, but reports that this is due to her knee,    Personal Factors and Comorbidities Comorbidity 2    Comorbidities migraine headaches, dizziness    Examination-Activity Limitations  Squat;Stairs;Stand;Locomotion Level    Examination-Participation Restrictions Community Activity;Interpersonal Relationship    Stability/Clinical Decision Making Evolving/Moderate complexity    PT Frequency 2x / week    PT Duration 8 weeks    PT Treatment/Interventions ADLs/Self Care Home Management;Cryotherapy;Electrical Stimulation;Iontophoresis 4mg /ml Dexamethasone;Ultrasound;Therapeutic activities;Therapeutic exercise;Neuromuscular re-education;Manual techniques;Patient/family education;Dry needling;Gait training;Stair training;Functional mobility training;Balance training;Passive range of motion;Scar mobilization;Taping;Vasopneumatic Device    PT Next Visit Plan gait training with LRAD, ROM, LE strength           Patient will benefit from skilled therapeutic intervention in order to improve the following deficits and impairments:  Decreased range of motion, Difficulty walking, Increased muscle spasms, Decreased activity tolerance, Pain, Decreased balance, Decreased mobility, Decreased strength  Visit Diagnosis: Difficulty in walking, not elsewhere classified  Pain in left ankle and joints of left foot  Acute pain of left shoulder  Stiffness of left ankle, not elsewhere classified  Cramp and spasm     Problem List Patient Active Problem List   Diagnosis Date Noted  . Acute maxillary sinusitis 10/18/2019  . Memory loss 06/12/2018  . Biliary dyskinesia 08/28/2012  . Tobacco use 07/02/2012  . Migraine headache 07/02/2012    Scot Jun, PTA 06/03/2020, 11:50 AM  [place] [place], [place], [address] Phone: [phone]   Fax:  [phone]  Name: [name] MRN: [id number] Date of Birth: 07/01/1966

## 2020-06-05 ENCOUNTER — Ambulatory Visit: Payer: 59 | Admitting: Physical Therapy

## 2020-06-10 ENCOUNTER — Other Ambulatory Visit: Payer: Self-pay

## 2020-06-10 ENCOUNTER — Encounter: Payer: Self-pay | Admitting: Physical Therapy

## 2020-06-10 ENCOUNTER — Ambulatory Visit: Payer: 59 | Admitting: Physical Therapy

## 2020-06-10 DIAGNOSIS — R262 Difficulty in walking, not elsewhere classified: Secondary | ICD-10-CM

## 2020-06-10 DIAGNOSIS — M25672 Stiffness of left ankle, not elsewhere classified: Secondary | ICD-10-CM | POA: Diagnosis not present

## 2020-06-10 DIAGNOSIS — M25572 Pain in left ankle and joints of left foot: Secondary | ICD-10-CM

## 2020-06-10 NOTE — Therapy (Signed)
[place], [place], [address] Phone: [phone]   Fax:  [phone]  Physical Therapy Treatment  Patient Details  Name: [name] MRN: [id number] Date of Birth: December 16, 1965 Referring Provider (PT): Linna Darner   Encounter Date: 06/10/2020   PT End of Session - 06/10/20 1417    Visit Number 7    Date for PT Re-Evaluation 07/07/20    PT Start Time 1345    PT Stop Time 1417    PT Time Calculation (min) 32 min    Activity Tolerance Patient tolerated treatment well    Behavior During Therapy [facility] for tasks assessed/performed           Past Medical History:  Diagnosis Date  . Allergy   . Dizziness   . Gastroparesis    [facility]  . Migraine    [facility]  . PONV (postoperative nausea and vomiting)   . Weight decrease     Past Surgical History:  Procedure Laterality Date  . ABDOMINAL HYSTERECTOMY    . abdominalplasty     s/p weight loss  . BLOOD PATCH  2019  . brachiplasty     s/p weight loss   . CHOLECYSTECTOMY  09/04/2012   Procedure: LAPAROSCOPIC CHOLECYSTECTOMY;  Surgeon: Harl Bowie, MD;  Location: [place];  Service: General;  Laterality: N/A;  . COLONOSCOPY  08/21/12   few scattered sigmoid diverticula: one sessile right colon polyp removed by cold biopsies x2:  patchy erythema in the left colon multiple random biopsies done:  normal therminal ileum  . COSMETIC SURGERY    . KNEE ARTHROSCOPY W/ ACL RECONSTRUCTION     in early 1990's  . LUMBAR PUNCTURE  2019  . TONSILLECTOMY      There were no vitals filed for this visit.   Subjective Assessment - 06/10/20 1352    Subjective "The knee is what's killing me today"    Currently in Pain? Yes    Pain Score 7     Pain Location Knee    Pain Orientation Left              [facility] PT Assessment - 06/10/20 0001      AROM   Left Ankle Dorsiflexion 7    Left Ankle Plantar Flexion 41    Left Ankle  Inversion 29    Left Ankle Eversion 20                         [facility] Adult PT Treatment/Exercise - 06/10/20 0001      Manual Therapy   Manual Therapy Passive ROM    Manual therapy comments some PROM taken to end range and held    Passive ROM L ankle all motions      Ankle Exercises: Aerobic   Recumbent Bike L0 x6 min      Ankle Exercises: Standing   Other Standing Ankle Exercises heel raises 2x15 black bar    Other Standing Ankle Exercises LLE dyna disk AROM       Ankle Exercises: Seated   Other Seated Ankle Exercises 4 way PRE's green x 20 each                    PT Short Term Goals - 05/19/20 1627      PT SHORT TERM GOAL #1   Title Patient will be independent with home exercise program for self management.  Time 2    Period Weeks    Status Achieved    Target Date 05/21/20             PT Long Term Goals - 05/30/20 1107      PT LONG TERM GOAL #1   Title Pt will demonstrate gait WFL without AD and equal WB    Baseline using SPC; leaning to RLE and excessvie WB through RUE    Time 8    Period Weeks    Status On-going      PT LONG TERM GOAL #2   Title Pt will demo L ankle ROM equivalent to R ankle    Time 8    Period Weeks    Status On-going      PT LONG TERM GOAL #3   Title Pt will demo L ankle pain decreased by 50%    Time 8    Period Weeks    Status Achieved      PT LONG TERM GOAL #4   Title Pt will demo SLS on L LE for 10 sec with no increase in pain    Baseline unable to maintain SLS >1sec on LLE    Time 8    Period Weeks    Status On-going                 Plan - 06/10/20 1418    Clinical Impression Statement Pt enters clinic reporting increase L knee pain. She has progressed increasing her L ankle AROM in all directions. Report some ankle pain with heel raises. Increase reps tolerated with 4 way ankle PRE's. Pt elected shorten treatment time dues to knee pain.    Personal Factors and Comorbidities Comorbidity 2     Comorbidities migraine headaches, dizziness    Examination-Participation Restrictions Community Activity;Interpersonal Relationship    Stability/Clinical Decision Making Evolving/Moderate complexity    Rehab Potential Good    PT Frequency 2x / week    PT Duration 8 weeks    PT Treatment/Interventions ADLs/Self Care Home Management;Cryotherapy;Electrical Stimulation;Iontophoresis 4mg /ml Dexamethasone;Ultrasound;Therapeutic activities;Therapeutic exercise;Neuromuscular re-education;Manual techniques;Patient/family education;Dry needling;Gait training;Stair training;Functional mobility training;Balance training;Passive range of motion;Scar mobilization;Taping;Vasopneumatic Device    PT Next Visit Plan gait training with LRAD, ROM, LE strength           Patient will benefit from skilled therapeutic intervention in order to improve the following deficits and impairments:  Decreased range of motion, Difficulty walking, Increased muscle spasms, Decreased activity tolerance, Pain, Decreased balance, Decreased mobility, Decreased strength  Visit Diagnosis: Pain in left ankle and joints of left foot  Difficulty in walking, not elsewhere classified  Stiffness of left ankle, not elsewhere classified     Problem List Patient Active Problem List   Diagnosis Date Noted  . Acute maxillary sinusitis 10/18/2019  . Memory loss 06/12/2018  . Biliary dyskinesia 08/28/2012  . Tobacco use 07/02/2012  . Migraine headache 07/02/2012    Scot Jun, PTA 06/10/2020, 2:21 PM  [place] [address] [place], [place], [address] Phone: [phone]   Fax:  [phone]  Name: [name] MRN: [id number] Date of Birth: 06-03-1966

## 2020-06-12 ENCOUNTER — Ambulatory Visit: Payer: 59 | Admitting: Physical Therapy

## 2020-06-17 ENCOUNTER — Ambulatory Visit: Payer: 59 | Attending: Student | Admitting: Physical Therapy

## 2020-06-17 ENCOUNTER — Other Ambulatory Visit: Payer: Self-pay

## 2020-06-17 DIAGNOSIS — M25572 Pain in left ankle and joints of left foot: Secondary | ICD-10-CM

## 2020-06-17 DIAGNOSIS — R262 Difficulty in walking, not elsewhere classified: Secondary | ICD-10-CM | POA: Diagnosis present

## 2020-06-17 DIAGNOSIS — M25512 Pain in left shoulder: Secondary | ICD-10-CM | POA: Diagnosis present

## 2020-06-17 DIAGNOSIS — M25672 Stiffness of left ankle, not elsewhere classified: Secondary | ICD-10-CM | POA: Diagnosis present

## 2020-06-17 NOTE — Therapy (Signed)
[place], [place], [address] Phone: [phone]   Fax:  [phone]  Physical Therapy Treatment  Patient Details  Name: [name] MRN: [id number] Date of Birth: May 29, 1966 Referring Provider (PT): Linna Darner   Encounter Date: 06/17/2020   PT End of Session - 06/17/20 1535    Visit Number 8    Date for PT Re-Evaluation 07/07/20    PT Start Time 5885    PT Stop Time 1528    PT Time Calculation (min) 43 min    Activity Tolerance Patient tolerated treatment well    Behavior During Therapy [name] for tasks assessed/performed           Past Medical History:  Diagnosis Date   Allergy    Dizziness    Gastroparesis    [facility]   Migraine    [facility]   PONV (postoperative nausea and vomiting)    Weight decrease     Past Surgical History:  Procedure Laterality Date   ABDOMINAL HYSTERECTOMY     abdominalplasty     s/p weight loss   BLOOD PATCH  2019   brachiplasty     s/p weight loss    CHOLECYSTECTOMY  09/04/2012   Procedure: LAPAROSCOPIC CHOLECYSTECTOMY;  Surgeon: Harl Bowie, MD;  Location: [facility];  Service: General;  Laterality: N/A;   COLONOSCOPY  08/21/12   few scattered sigmoid diverticula: one sessile right colon polyp removed by cold biopsies x2:  patchy erythema in the left colon multiple random biopsies done:  normal therminal ileum   COSMETIC SURGERY     KNEE ARTHROSCOPY W/ ACL RECONSTRUCTION     in early 1990's   LUMBAR PUNCTURE  2019   TONSILLECTOMY      There were no vitals filed for this visit.   Subjective Assessment - 06/17/20 1500    Subjective Pt reports knee is doing worse    Currently in Pain? Yes    Pain Score 8     Pain Location Knee    Pain Orientation Left    Pain Score 0    Pain Location Ankle    Pain Orientation Left                             [facility] Adult PT Treatment/Exercise - 06/17/20  0001      Ankle Exercises: Aerobic   Recumbent Bike L0 x 6 min    Nustep L5 x 6 min      Ankle Exercises: Seated   ABC's 1 rep    Other Seated Ankle Exercises STS from raised mat table 2x10      Ankle Exercises: Machines for Strengthening   Cybex Leg Press 20# 2x10 BLE      Ankle Exercises: Standing   Other Standing Ankle Exercises heel raises 2x10 20# leg press machine    Other Standing Ankle Exercises LLE dyna disk AROM    modified to seated d/t pt reports of knee pain                   PT Short Term Goals - 05/19/20 1627      PT SHORT TERM GOAL #1   Title Patient will be independent with home exercise program for self management.    Time 2    Period Weeks    Status Achieved    Target Date 05/21/20  PT Long Term Goals - 05/30/20 1107      PT LONG TERM GOAL #1   Title Pt will demonstrate gait WFL without AD and equal WB    Baseline using SPC; leaning to RLE and excessvie WB through RUE    Time 8    Period Weeks    Status On-going      PT LONG TERM GOAL #2   Title Pt will demo L ankle ROM equivalent to R ankle    Time 8    Period Weeks    Status On-going      PT LONG TERM GOAL #3   Title Pt will demo L ankle pain decreased by 50%    Time 8    Period Weeks    Status Achieved      PT LONG TERM GOAL #4   Title Pt will demo SLS on L LE for 10 sec with no increase in pain    Baseline unable to maintain SLS >1sec on LLE    Time 8    Period Weeks    Status On-going                 Plan - 06/17/20 1535    Clinical Impression Statement Pt presents to clinic with reports that L knee pain is no better after cortisone shot last week. Pt is planning for TKA in December of this year. Discussed reducing frequency of tx to 1x/week and updated HEP to include more ankle strengthening/flexibility ex's with pt verbalized understanding/agreement. Knee pain is primary limiting factor in making progress with ankle.    PT Treatment/Interventions  ADLs/Self Care Home Management;Cryotherapy;Electrical Stimulation;Iontophoresis 4mg /ml Dexamethasone;Ultrasound;Therapeutic activities;Therapeutic exercise;Neuromuscular re-education;Manual techniques;Patient/family education;Dry needling;Gait training;Stair training;Functional mobility training;Balance training;Passive range of motion;Scar mobilization;Taping;Vasopneumatic Device    PT Next Visit Plan gait training with LRAD, ROM, LE strength    Consulted and Agree with Plan of Care Patient           Patient will benefit from skilled therapeutic intervention in order to improve the following deficits and impairments:  Decreased range of motion, Difficulty walking, Increased muscle spasms, Decreased activity tolerance, Pain, Decreased balance, Decreased mobility, Decreased strength  Visit Diagnosis: Pain in left ankle and joints of left foot  Difficulty in walking, not elsewhere classified  Stiffness of left ankle, not elsewhere classified     Problem List Patient Active Problem List   Diagnosis Date Noted   Acute maxillary sinusitis 10/18/2019   Memory loss 06/12/2018   Biliary dyskinesia 08/28/2012   Tobacco use 07/02/2012   Migraine headache 07/02/2012   Amador Cunas, PT, DPT Donald Prose Tarius Stangelo 06/17/2020, 3:37 PM  [place] [address] [place], [place], [address] Phone: [phone]   Fax:  [phone]  Name: [name] MRN: [id number] Date of Birth: 1966-04-03

## 2020-06-24 ENCOUNTER — Encounter: Payer: Self-pay | Admitting: Physical Therapy

## 2020-06-24 ENCOUNTER — Other Ambulatory Visit: Payer: Self-pay

## 2020-06-24 ENCOUNTER — Ambulatory Visit: Payer: 59 | Admitting: Physical Therapy

## 2020-06-24 DIAGNOSIS — R262 Difficulty in walking, not elsewhere classified: Secondary | ICD-10-CM

## 2020-06-24 DIAGNOSIS — M25672 Stiffness of left ankle, not elsewhere classified: Secondary | ICD-10-CM

## 2020-06-24 DIAGNOSIS — M25572 Pain in left ankle and joints of left foot: Secondary | ICD-10-CM | POA: Diagnosis not present

## 2020-06-24 NOTE — Therapy (Signed)
[place], [place], [address] Phone: [phone]   Fax:  [phone]  Physical Therapy Treatment  Patient Details  Name: [name] MRN: [id number] Date of Birth: [birth year] Referring Provider (PT): Linna Darner   Encounter Date: 06/24/2020   PT End of Session - 06/24/20 1513    Visit Number 9    Date for PT Re-Evaluation 07/07/20    PT Start Time 1430    PT Stop Time 1515    PT Time Calculation (min) 45 min    Activity Tolerance Patient tolerated treatment well    Behavior During Therapy [facility] for tasks assessed/performed           Past Medical History:  Diagnosis Date   Allergy    Dizziness    Gastroparesis    [facility]   Migraine    [facility]   PONV (postoperative nausea and vomiting)    Weight decrease     Past Surgical History:  Procedure Laterality Date   ABDOMINAL HYSTERECTOMY     abdominalplasty     s/p weight loss   BLOOD PATCH  2019   brachiplasty     s/p weight loss    CHOLECYSTECTOMY  09/04/2012   Procedure: LAPAROSCOPIC CHOLECYSTECTOMY;  Surgeon: Harl Bowie, MD;  Location: [facility];  Service: General;  Laterality: N/A;   COLONOSCOPY  08/21/12   few scattered sigmoid diverticula: one sessile right colon polyp removed by cold biopsies x2:  patchy erythema in the left colon multiple random biopsies done:  normal therminal ileum   COSMETIC SURGERY     KNEE ARTHROSCOPY W/ ACL RECONSTRUCTION     in early 1990's   LUMBAR PUNCTURE  2019   TONSILLECTOMY      There were no vitals filed for this visit.   Subjective Assessment - 06/24/20 1435    Subjective Difficulty walking up inclines, knee is the only thing hurting    Currently in Pain? Yes    Pain Score 5     Pain Location Knee    Pain Orientation Left                             [facility] Adult PT Treatment/Exercise - 06/24/20 0001      High Level Balance    High Level Balance Comments side sto on and off Airex      Ankle Exercises: Aerobic   Recumbent Bike L0 x 4 min    Nustep L5 x 6 min      Ankle Exercises: Standing   Other Standing Ankle Exercises heel raises 2x10     Other Standing Ankle Exercises resisted gait 30lb fwd/bak x3 each; Toe & Heel walking HHA x 2       Ankle Exercises: Machines for Strengthening   Cybex Leg Press 30# 2x10 BLE, #30lb heel raises 2x15      Ankle Exercises: Seated   Other Seated Ankle Exercises STS from mat table on airex 2x10                     PT Short Term Goals - 05/19/20 1627      PT SHORT TERM GOAL #1   Title Patient will be independent with home exercise program for self management.    Time 2    Period Weeks    Status Achieved    Target Date 05/21/20  PT Long Term Goals - 05/30/20 1107      PT LONG TERM GOAL #1   Title Pt will demonstrate gait WFL without AD and equal WB    Baseline using SPC; leaning to RLE and excessvie WB through RUE    Time 8    Period Weeks    Status On-going      PT LONG TERM GOAL #2   Title Pt will demo L ankle ROM equivalent to R ankle    Time 8    Period Weeks    Status On-going      PT LONG TERM GOAL #3   Title Pt will demo L ankle pain decreased by 50%    Time 8    Period Weeks    Status Achieved      PT LONG TERM GOAL #4   Title Pt will demo SLS on L LE for 10 sec with no increase in pain    Baseline unable to maintain SLS >1sec on LLE    Time 8    Period Weeks    Status On-going                 Plan - 06/24/20 1514    Clinical Impression Statement L knee pain seem to more of a limiting factor. Cues not to drag LE with resisted gait. Some soreness from doing ankle exercises at home. Weakness noted with toe and heel walking. Some instability with non compliant surfaces.    Personal Factors and Comorbidities Comorbidity 2    Comorbidities migraine headaches, dizziness    Examination-Activity Limitations  Squat;Stairs;Stand;Locomotion Level    Examination-Participation Restrictions Community Activity;Interpersonal Relationship    Stability/Clinical Decision Making Evolving/Moderate complexity    Rehab Potential Good    PT Frequency 2x / week    PT Treatment/Interventions ADLs/Self Care Home Management;Cryotherapy;Electrical Stimulation;Iontophoresis 4mg /ml Dexamethasone;Ultrasound;Therapeutic activities;Therapeutic exercise;Neuromuscular re-education;Manual techniques;Patient/family education;Dry needling;Gait training;Stair training;Functional mobility training;Balance training;Passive range of motion;Scar mobilization;Taping;Vasopneumatic Device    PT Next Visit Plan gait training with LRAD, ROM, LE strength           Patient will benefit from skilled therapeutic intervention in order to improve the following deficits and impairments:  Decreased range of motion, Difficulty walking, Increased muscle spasms, Decreased activity tolerance, Pain, Decreased balance, Decreased mobility, Decreased strength  Visit Diagnosis: Stiffness of left ankle, not elsewhere classified  Difficulty in walking, not elsewhere classified  Pain in left ankle and joints of left foot     Problem List Patient Active Problem List   Diagnosis Date Noted   Acute maxillary sinusitis 10/18/2019   Memory loss 06/12/2018   Biliary dyskinesia 08/28/2012   Tobacco use 07/02/2012   Migraine headache 07/02/2012    Scot Jun, PTA 06/24/2020, 3:16 PM  [place], [place], [address] Phone: [phone]   Fax:  [phone]  Name: SHANETA CERVENKA MRN: [id number] Date of Birth: [birth year]

## 2020-06-26 ENCOUNTER — Encounter: Payer: 59 | Admitting: Physical Therapy

## 2020-07-01 ENCOUNTER — Ambulatory Visit: Payer: 59 | Admitting: Physical Therapy

## 2020-07-01 ENCOUNTER — Other Ambulatory Visit: Payer: Self-pay

## 2020-07-01 ENCOUNTER — Encounter: Payer: Self-pay | Admitting: Physical Therapy

## 2020-07-01 DIAGNOSIS — R262 Difficulty in walking, not elsewhere classified: Secondary | ICD-10-CM

## 2020-07-01 DIAGNOSIS — M25672 Stiffness of left ankle, not elsewhere classified: Secondary | ICD-10-CM

## 2020-07-01 DIAGNOSIS — M25512 Pain in left shoulder: Secondary | ICD-10-CM

## 2020-07-01 DIAGNOSIS — M25572 Pain in left ankle and joints of left foot: Secondary | ICD-10-CM

## 2020-07-01 NOTE — Therapy (Signed)
[place], [place], [address] Phone: [phone]   Fax:  [phone]  Physical Therapy Treatment  Patient Details  Name: [name] MRN: [id number] Date of Birth: [birth year] Referring Provider (PT): Linna Darner   Encounter Date: 07/01/2020   PT End of Session - 07/01/20 1525    Visit Number 10    Date for PT Re-Evaluation 07/07/20    PT Start Time 1430    PT Stop Time 1513    PT Time Calculation (min) 43 min    Activity Tolerance Patient tolerated treatment well    Behavior During Therapy [facility] for tasks assessed/performed           Past Medical History:  Diagnosis Date  . Allergy   . Dizziness   . Gastroparesis    [facility]  . Migraine    [facility]  . PONV (postoperative nausea and vomiting)   . Weight decrease     Past Surgical History:  Procedure Laterality Date  . ABDOMINAL HYSTERECTOMY    . abdominalplasty     s/p weight loss  . BLOOD PATCH  2019  . brachiplasty     s/p weight loss   . CHOLECYSTECTOMY  09/04/2012   Procedure: LAPAROSCOPIC CHOLECYSTECTOMY;  Surgeon: Harl Bowie, MD;  Location: [facility];  Service: General;  Laterality: N/A;  . COLONOSCOPY  08/21/12   few scattered sigmoid diverticula: one sessile right colon polyp removed by cold biopsies x2:  patchy erythema in the left colon multiple random biopsies done:  normal therminal ileum  . COSMETIC SURGERY    . KNEE ARTHROSCOPY W/ ACL RECONSTRUCTION     in early 1990's  . LUMBAR PUNCTURE  2019  . TONSILLECTOMY      There were no vitals filed for this visit.   Subjective Assessment - 07/01/20 1435    Subjective I give the ankle maybe a three today, the knee a 4    Currently in Pain? Yes    Pain Score 4     Pain Location --   ankle and knee   Pain Orientation Left              [facility] PT Assessment - 07/01/20 0001      AROM   Left Ankle Dorsiflexion 10    Left Ankle Plantar Flexion 50     Left Ankle Inversion 31    Left Ankle Eversion 22                         [facility] Adult PT Treatment/Exercise - 07/01/20 0001      Ankle Exercises: Aerobic   Recumbent Bike L0 x 4 min    Nustep L5 x 6 min      Ankle Exercises: Machines for Strengthening   Cybex Leg Press 40# 2x10 BLE, #40lb heel raises 2x15      Ankle Exercises: Seated   Ankle Circles/Pumps AROM;Left;20 reps    Other Seated Ankle Exercises STS from mat table on airex 2x10       Ankle Exercises: Standing   Other Standing Ankle Exercises heel raises 2x10    Other Standing Ankle Exercises resisted gait 30lb fwd & side to side x3 each; Toe & Heel walking                     PT Short Term Goals - 05/19/20 1627      PT  SHORT TERM GOAL #1   Title Patient will be independent with home exercise program for self management.    Time 2    Period Weeks    Status Achieved    Target Date 05/21/20             PT Long Term Goals - 07/01/20 1526      PT LONG TERM GOAL #1   Title Pt will demonstrate gait WFL without AD and equal WB    Status Partially Met      PT LONG TERM GOAL #2   Title Pt will demo L ankle ROM equivalent to R ankle    Status Partially Met      PT LONG TERM GOAL #3   Title Pt will demo L ankle pain decreased by 50%    Status Achieved      PT LONG TERM GOAL #4   Title Pt will demo SLS on L LE for 10 sec with no increase in pain    Status On-going                 Plan - 07/01/20 1527    Clinical Impression Statement Overall pt was able to complete the interventions. She has progressed increasing her L ankle AROM some. She reports some uncertainty with non compliant surfaces. Cues to complete the full ROM with heel raises. Knee pain continues to be a limiting issue    Personal Factors and Comorbidities Comorbidity 2    Comorbidities migraine headaches, dizziness    Examination-Activity Limitations Squat;Stairs;Stand;Locomotion Level    Examination-Participation  Restrictions Community Activity;Interpersonal Relationship    Stability/Clinical Decision Making Evolving/Moderate complexity    PT Frequency 2x / week    PT Duration 8 weeks    PT Treatment/Interventions ADLs/Self Care Home Management;Cryotherapy;Electrical Stimulation;Iontophoresis 38m/ml Dexamethasone;Ultrasound;Therapeutic activities;Therapeutic exercise;Neuromuscular re-education;Manual techniques;Patient/family education;Dry needling;Gait training;Stair training;Functional mobility training;Balance training;Passive range of motion;Scar mobilization;Taping;Vasopneumatic Device           Patient will benefit from skilled therapeutic intervention in order to improve the following deficits and impairments:  Decreased range of motion, Difficulty walking, Increased muscle spasms, Decreased activity tolerance, Pain, Decreased balance, Decreased mobility, Decreased strength  Visit Diagnosis: Pain in left ankle and joints of left foot  Acute pain of left shoulder  Difficulty in walking, not elsewhere classified  Stiffness of left ankle, not elsewhere classified     Problem List Patient Active Problem List   Diagnosis Date Noted  . Acute maxillary sinusitis 10/18/2019  . Memory loss 06/12/2018  . Biliary dyskinesia 08/28/2012  . Tobacco use 07/02/2012  . Migraine headache 07/02/2012    RScot Jun PTA 07/01/2020, 3:36 PM  [place] [address]Phone: [phone]  Fax:  [phone] Name: Betty TILSONMRN: [id number] of Birth: 3Jul 28, 1967

## 2020-07-03 ENCOUNTER — Encounter: Payer: 59 | Admitting: Physical Therapy

## 2020-07-23 ENCOUNTER — Encounter: Payer: 59 | Admitting: Physical Therapy

## 2020-12-03 ENCOUNTER — Encounter: Payer: Self-pay | Admitting: Dermatology

## 2020-12-03 ENCOUNTER — Other Ambulatory Visit: Payer: Self-pay

## 2020-12-03 ENCOUNTER — Ambulatory Visit: Payer: 59 | Admitting: Dermatology

## 2020-12-03 DIAGNOSIS — L821 Other seborrheic keratosis: Secondary | ICD-10-CM

## 2020-12-03 DIAGNOSIS — D1800 Hemangioma unspecified site: Secondary | ICD-10-CM

## 2020-12-03 DIAGNOSIS — R238 Other skin changes: Secondary | ICD-10-CM

## 2020-12-03 DIAGNOSIS — L950 Livedoid vasculitis: Secondary | ICD-10-CM | POA: Diagnosis not present

## 2020-12-14 ENCOUNTER — Encounter: Payer: Self-pay | Admitting: Dermatology

## 2020-12-14 NOTE — Progress Notes (Signed)
   Follow-Up Visit   Subjective  [name] is a 55 y.o. female who presents for the following: Skin Problem (Dark Spot behind left ear x months, no bleeding./Thin out light spot on left ankle, painful to touch, thin skin that bleeds.).  Dark spot back of left ear, sore spot on left ankle. Location:  Duration:  Quality:  Associated Signs/Symptoms: Modifying Factors:  Severity:  Timing: Context:   Objective  Well appearing patient in no apparent distress; mood and affect are within normal limits. Objective  Right Zygomatic Area: Light brown subtly textured 8 mm papule; dermoscopy shows no atypia.  Objective  Left Postauricular Area: Soft bluish 8mm papule easily blanches with pressure  Objective  Left Foot - Anterior: Subtle 9 mm area which is hypopigmented and slightly atrophic.  Strong pedal pulses.  No signs of venous insufficiency.  No other areas on either leg suggestive of vasculopathy.  No historical evidence of vasculopathy or coagulopathy.    A focused examination was performed including Head and neck, arms, fingernails and nailbeds, lower extremities, vascular examination.. Relevant physical exam findings are noted in the Assessment and Plan.   Assessment & Plan    Seborrheic keratosis Right Zygomatic Area  Okay to leave if stable; to discuss the option of light freeze for aesthetic purposes.  Patient chooses to leave this for now  Venous lake Left Postauricular Area  Okay to leave it stable  Livedo vasculitis Left Foot - Anterior  We discussed the positives and negatives of obtaining biopsies and mutually we feel for now the negatives may outweigh the benefits.  Beyond the questionable benefit of a low-dose aspirin therapy, for now there is no indication for intervention.  I definitely would like her to return if she sees new lower leg changes appear.      I, Lavonna Monarch, MD, have reviewed all documentation for this visit.  The documentation on  12/14/20 for the exam, diagnosis, procedures, and orders are all accurate and complete.

## 2020-12-31 DIAGNOSIS — K589 Irritable bowel syndrome without diarrhea: Secondary | ICD-10-CM | POA: Insufficient documentation

## 2021-11-12 ENCOUNTER — Other Ambulatory Visit: Payer: Self-pay | Admitting: Obstetrics and Gynecology

## 2021-11-13 ENCOUNTER — Other Ambulatory Visit: Payer: Self-pay | Admitting: Obstetrics and Gynecology

## 2021-11-13 DIAGNOSIS — Z800 Family history of malignant neoplasm of digestive organs: Secondary | ICD-10-CM

## 2021-11-19 ENCOUNTER — Telehealth: Payer: Self-pay

## 2021-11-19 NOTE — Progress Notes (Signed)
Phone call to patient to review instructions for 13 hr prep for MRI w/ contrast on 12/02/21 at 1130 AM. Prescription called into [facility]. Pt aware and verbalized understanding of instructions. Prescription: 12/01/21 @ 10:30 PM 12/02/21 @ 4:30 AM- 50mg  Prednisone 12/02/21 @ 10:30 AM- 50mg  Prednisone and 50mg  Benadryl   Pt reports she has benadryl at home and did not wish for this to be called in as a prescription.

## 2021-12-02 ENCOUNTER — Ambulatory Visit
Admission: RE | Admit: 2021-12-02 | Discharge: 2021-12-02 | Disposition: A | Discharge status: Home / Self Care | Payer: 59 | Source: Ambulatory Visit | Attending: Obstetrics and Gynecology | Admitting: Obstetrics and Gynecology

## 2021-12-02 DIAGNOSIS — Z800 Family history of malignant neoplasm of digestive organs: Secondary | ICD-10-CM

## 2021-12-02 MED ORDER — GADOBENATE DIMEGLUMINE 529 MG/ML IV SOLN
14.0000 mL | Freq: Once | INTRAVENOUS | Status: AC | PRN
  Administered 2021-12-02: 14 mL via INTRAVENOUS

## 2021-12-21 ENCOUNTER — Encounter: Payer: Self-pay | Admitting: Surgery

## 2021-12-21 ENCOUNTER — Ambulatory Visit: Payer: Self-pay | Admitting: Surgery

## 2021-12-21 DIAGNOSIS — Z8719 Personal history of other diseases of the digestive system: Secondary | ICD-10-CM | POA: Insufficient documentation

## 2021-12-21 DIAGNOSIS — Z800 Family history of malignant neoplasm of digestive organs: Secondary | ICD-10-CM

## 2021-12-21 DIAGNOSIS — R739 Hyperglycemia, unspecified: Secondary | ICD-10-CM

## 2021-12-21 DIAGNOSIS — K56699 Other intestinal obstruction unspecified as to partial versus complete obstruction: Secondary | ICD-10-CM | POA: Insufficient documentation

## 2021-12-21 HISTORY — DX: Personal history of other diseases of the digestive system: Z87.19

## 2021-12-21 HISTORY — DX: Family history of malignant neoplasm of digestive organs: Z80.0

## 2022-01-21 ENCOUNTER — Ambulatory Visit: Payer: 59 | Admitting: Family Medicine

## 2022-01-21 ENCOUNTER — Encounter: Payer: Self-pay | Admitting: Family Medicine

## 2022-01-21 VITALS — BP 130/70 | HR 78 | Temp 97.1°F | Ht 64.0 in | Wt 160.0 lb

## 2022-01-21 DIAGNOSIS — Z23 Encounter for immunization: Secondary | ICD-10-CM | POA: Diagnosis not present

## 2022-01-21 DIAGNOSIS — K56699 Other intestinal obstruction unspecified as to partial versus complete obstruction: Secondary | ICD-10-CM

## 2022-01-21 DIAGNOSIS — G4700 Insomnia, unspecified: Secondary | ICD-10-CM

## 2022-01-21 DIAGNOSIS — K5792 Diverticulitis of intestine, part unspecified, without perforation or abscess without bleeding: Secondary | ICD-10-CM | POA: Diagnosis not present

## 2022-01-21 DIAGNOSIS — F419 Anxiety disorder, unspecified: Secondary | ICD-10-CM

## 2022-01-21 DIAGNOSIS — F5102 Adjustment insomnia: Secondary | ICD-10-CM | POA: Diagnosis not present

## 2022-01-21 DIAGNOSIS — G3184 Mild cognitive impairment, so stated: Secondary | ICD-10-CM

## 2022-01-21 DIAGNOSIS — K921 Melena: Secondary | ICD-10-CM

## 2022-01-21 MED ORDER — SULFAMETHOXAZOLE-TRIMETHOPRIM 800-160 MG PO TABS: 1.0000 | ORAL_TABLET | Freq: Two times a day (BID) | ORAL | 0 refills | Status: AC

## 2022-01-21 MED ORDER — DAYVIGO 10 MG PO TABS: 1.0000 | ORAL_TABLET | Freq: Every day | ORAL | 3 refills | Status: DC | PRN

## 2022-01-21 MED ORDER — METRONIDAZOLE 500 MG PO TABS: 500.0000 mg | ORAL_TABLET | Freq: Three times a day (TID) | ORAL | 0 refills | Status: AC

## 2022-01-21 NOTE — Progress Notes (Signed)
? ?  [name] is a 56 y.o. female who presents today for an office visit. ? ?Assessment/Plan:  ?New/Acute Problems: ?none ? ?Chronic Problems Addressed Today: ?Diverticulitis ?Associated with hematochezia and colonic stricture.  Already referred to [facility] colonic surgery for possible resection.  Has follow-up in fall 2020 ?Has flares on occasion, would like an antibiotic, has difficulty getting hold GI sometimes.  Has multiple allergies to ciprofloxacin and penicillins ?Discussed having 1 course of antibiotics on hand, Bactrim/Flagyl, stressed the seriousness of following up with GI and how future flare should be handled by them ? ?MCI (mild cognitive impairment) ?Follows w/ neurology ?Stable on Arecept.  ? ?Anxiety ?Triggers for mother-in-law.  Follows with psychiatry controlled on diazepam prn ? ?Insomnia ?Stable ?Controlled on day Vigo 10 mg nightly, refill for 3 months ? ? ?  ?Subjective:  ?HPI: ? ?[name] is a 56 y.o. female  has a past medical history of Allergy, Anxiety, Arthritis, Depression, Dizziness, Family history of malignant neoplasm of digestive organs (12/21/2021), Gastroparesis, GERD (gastroesophageal reflux disease), History of colonic diverticulitis - recurrent with stricture (12/21/2021), Migraine, PONV (postoperative nausea and vomiting), and Weight decrease. ?who presents with  ?Chief Complaint  ?Patient presents with  ? Establish Care  ?  Np. Est care. Diverticulitis question    ? ?  ? ?   ?  ?Objective:  ?Physical Exam: ?BP 130/70 (BP Location: Left Arm, Patient Position: Sitting, Cuff Size: Normal)   Pulse 78   Temp (!) 97.1 ?F (36.2 ?C) (Temporal)   Ht '5\' 4"'$  (1.626 m)   Wt 160 lb (72.6 kg)   SpO2 99%   BMI 27.46 kg/m?   ?Gen: No acute distress, resting comfortably ?CV: Regular rate and rhythm with no murmurs appreciated ?Pulm: Normal work of breathing, clear to auscultation bilaterally with no crackles, wheezes, or rhonchi ?Abdominal: No tenderness, soft in all quadrants ?Neuro:  Grossly normal, moves all extremities ?Psych: Normal affect and thought content ? ?   ? ?[name] ?01/21/2022 11:47 AM  ?

## 2022-01-21 NOTE — Assessment & Plan Note (Addendum)
Associated with hematochezia and colonic stricture.  Already referred to [facility] colonic surgery for possible resection.  Has follow-up in fall 2023 ?Has flares on occasion, would like an antibiotic, has difficulty getting hold GI sometimes.  Has multiple allergies to ciprofloxacin and penicillins ?Discussed having 1 course of antibiotics on hand, Bactrim/Flagyl, stressed the seriousness of following up with GI and how future flare should be handled by them ?

## 2022-01-21 NOTE — Assessment & Plan Note (Addendum)
Triggers for mother-in-law.  Follows with psychiatry controlled on diazepam prn ?

## 2022-01-21 NOTE — Assessment & Plan Note (Signed)
Stable ?Controlled on day Vigo 10 mg nightly, refill for 3 months ?

## 2022-01-21 NOTE — Patient Instructions (Signed)
We refilled your [name]. ? ?Take Bactrim/Flagyl combination if you do have a flare of diverticulitis.  Please follow-up with your GI/surgeon and let us know anything else is going on. ? ?

## 2022-01-21 NOTE — Assessment & Plan Note (Signed)
Follows w/ neurology ?Stable on Arecept.  ?

## 2022-04-22 ENCOUNTER — Ambulatory Visit (INDEPENDENT_AMBULATORY_CARE_PROVIDER_SITE_OTHER): Payer: 59 | Admitting: Family Medicine

## 2022-04-22 ENCOUNTER — Encounter: Payer: Self-pay | Admitting: Family Medicine

## 2022-04-22 VITALS — BP 124/82 | HR 69 | Temp 98.0°F | Wt 174.0 lb

## 2022-04-22 DIAGNOSIS — Z23 Encounter for immunization: Secondary | ICD-10-CM

## 2022-04-22 DIAGNOSIS — M5442 Lumbago with sciatica, left side: Secondary | ICD-10-CM

## 2022-04-22 DIAGNOSIS — G8929 Other chronic pain: Secondary | ICD-10-CM

## 2022-04-22 DIAGNOSIS — F5101 Primary insomnia: Secondary | ICD-10-CM | POA: Diagnosis not present

## 2022-04-22 MED ORDER — DAYVIGO 10 MG PO TABS: 1.0000 | ORAL_TABLET | Freq: Every day | ORAL | 2 refills | Status: AC | PRN

## 2022-04-22 MED ORDER — DULOXETINE HCL 20 MG PO CPEP: 20.0000 mg | ORAL_CAPSULE | Freq: Every day | ORAL | 0 refills | Status: DC

## 2022-04-22 NOTE — Patient Instructions (Addendum)
Take Cymbalta for mood and pain F/U in 1 month or later if adjusted by neurology

## 2022-04-22 NOTE — Assessment & Plan Note (Signed)
Chronic, stable Continue day Vigo 10 mg nightly Follow-up in 3 months

## 2022-04-22 NOTE — Progress Notes (Signed)
Assessment/Plan:   Problem List Items Addressed This Visit       Nervous and Auditory   Chronic midline low back pain with left-sided sciatica - Primary    Although she follows with pain management, she and she has follow-up with neurology Believe it is reasonable to try low-dose duloxetine to see if she does get any relief from the neuropathic side of the pain especially given the fact that she is stopped taking the Wellbutrin and gabapentin 20 mg daily Follow-up 1 month or later depending on recommendations of specialists      Relevant Medications   Lemborexant ([name]) 10 MG TABS   DULoxetine (CYMBALTA) 20 MG capsule     Other   Insomnia    Chronic, stable Continue day Vigo 10 mg nightly Follow-up in 3 months      Relevant Medications   Lemborexant ([name]) 10 MG TABS   Other Visit Diagnoses     Need for shingles vaccine       Relevant Orders   Varicella-zoster vaccine IM (Shingrix) (Completed)          Subjective:  HPI:  [name] is a 56 y.o. female who has Chronic migraine without aura; Irritable bowel syndrome; Stricture of sigmoid colon ([place]); Diverticulitis; MCI (mild cognitive impairment); Insomnia; Anxiety; Hematochezia; and Chronic midline low back pain with left-sided sciatica on their problem list..   She  has a past medical history of Allergy, Anxiety, Arthritis, Depression, Dizziness, Family history of malignant neoplasm of digestive organs (12/21/2021), Gastroparesis, GERD (gastroesophageal reflux disease), History of colonic diverticulitis - recurrent with stricture (12/21/2021), Migraine, PONV (postoperative nausea and vomiting), and Weight decrease..   She presents with chief complaint of Follow-up (3 month follow up on insomnia. Patient states it's the same.) .   Patient follow-up on insomnia.  She has been stable on day Vigo 10 mg nightly night.  Does not have any side effect from this medication such as drowsiness during the  day.  Patient has had follow-up with neurosurgery for ongoing lumbar disc disease.  She does have sciatica that runs on the left leg.  Does not not have any weakness in this leg.  She has been on gabapentin which does help some.  However she is hesitant to take it, because her mother had significant side effects from it.  Patient  Patient is a history of anxiety depression and has followed with psychiatry.  She has self weaned herself off Wellbutrin and has not noted any significant change in mood.    Past Surgical History:  Procedure Laterality Date   ABDOMINAL HYSTERECTOMY     abdominalplasty     s/p weight loss   BLOOD PATCH  2019   brachiplasty     s/p weight loss    CHOLECYSTECTOMY  09/04/2012   Procedure: LAPAROSCOPIC CHOLECYSTECTOMY;  Surgeon: Harl Bowie, MD;  Location: [place];  Service: General;  Laterality: N/A;   COLONOSCOPY  08/21/12   few scattered sigmoid diverticula: one sessile right colon polyp removed by cold biopsies x2:  patchy erythema in the left colon multiple random biopsies done:  normal therminal ileum   COSMETIC SURGERY     KNEE ARTHROSCOPY W/ ACL RECONSTRUCTION     in early 1990's   LUMBAR PUNCTURE  2019   TONSILLECTOMY      Outpatient Medications Prior to Visit  Medication Sig Dispense Refill   buPROPion (WELLBUTRIN SR) 200 MG 12 hr tablet      dexlansoprazole (DEXILANT) 60  MG capsule Take 60 mg by mouth daily.     donepezil (ARICEPT) 5 MG tablet Take 5 mg by mouth daily.     rizatriptan (MAXALT) 10 MG tablet Take 10 mg by mouth See admin instructions. Take 1 tablet (10 mg) by mouth as needed for migraine headache, May repeat in 2 hours if needed     Lemborexant ([name]) 10 MG TABS Take 1 tablet by mouth daily as needed. 30 tablet 3   ondansetron (ZOFRAN-ODT) 8 MG disintegrating tablet Take 1 tablet by mouth as needed.     OnabotulinumtoxinA (BOTOX IM) Inject into the muscle. (Patient not taking: Reported on 04/22/2022)     AJOVY 225 MG/1.5ML  SOSY  (Patient not taking: Reported on 04/22/2022)     aspirin 325 MG EC tablet  (Patient not taking: Reported on 04/22/2022)     promethazine (PHENERGAN) 25 MG tablet Take 25 mg by mouth daily as needed for nausea or vomiting.  (Patient not taking: Reported on 04/22/2022)     albuterol (PROVENTIL) (2.5 MG/3ML) 0.083% nebulizer solution 2.5 mg      No facility-administered medications prior to visit.    Family History  Problem Relation Age of Onset   Diabetes Mother    Hypertension Mother    Thyroid disease Mother    Cancer Father    Diabetes Father    Hypertension Father    Thyroid disease Sister    Diabetes Brother    Diabetes Maternal Grandmother    Cancer Maternal Grandfather     Social History   Socioeconomic History   Marital status: Married    Spouse name: Not on file   Number of children: 2   Years of education: 14   Highest education level: Some college, no degree  Occupational History   Not on file  Tobacco Use   Smoking status: Never   Smokeless tobacco: Never  Vaping Use   Vaping Use: Never used  Substance and Sexual Activity   Alcohol use: No   Drug use: No   Sexual activity: Not Currently  Other Topics Concern   Not on file  Social History Narrative   Lives at home with husband and children   Social Determinants of Health   Financial Resource Strain: Not on file  Food Insecurity: Not on file  Transportation Needs: Not on file  Physical Activity: Not on file  Stress: Not on file  Social Connections: Not on file  Intimate Partner Violence: Not on file                                                                                                 Objective:  Physical Exam: BP 124/82 (BP Location: Left Arm, Patient Position: Sitting, Cuff Size: Large)   Pulse 69   Temp 98 F (36.7 C) (Temporal)   Wt 174 lb (78.9 kg)   SpO2 99%   BMI 29.87 kg/m     General: No acute distress. Awake and conversant.  Eyes: Normal conjunctiva, anicteric.  Round symmetric pupils.  ENT: Hearing grossly intact. No nasal discharge.  Neck: Neck is supple. No  masses or thyromegaly.  Respiratory: Respirations are non-labored. No auditory wheezing.  Skin: Warm. No rashes or ulcers.  Psych: Alert and oriented. Cooperative, Appropriate mood and affect, Normal judgment.  CV: No cyanosis or JVD MSK: Normal ambulation. No clubbing  Neuro: Sensation and CN II-XII grossly normal.        Alesia Banda, MD, MS

## 2022-04-22 NOTE — Assessment & Plan Note (Signed)
Although she follows with pain management, she and she has follow-up with neurology Believe it is reasonable to try low-dose duloxetine to see if she does get any relief from the neuropathic side of the pain especially given the fact that she is stopped taking the Wellbutrin and gabapentin 20 mg daily Follow-up 1 month or later depending on recommendations of specialists

## 2022-05-25 ENCOUNTER — Ambulatory Visit: Payer: 59 | Admitting: Family Medicine

## 2022-07-21 ENCOUNTER — Institutional Professional Consult (permissible substitution): Payer: 59 | Admitting: Plastic Surgery

## 2022-07-21 ENCOUNTER — Other Ambulatory Visit: Payer: Self-pay | Admitting: Neurological Surgery

## 2022-07-21 DIAGNOSIS — M5416 Radiculopathy, lumbar region: Secondary | ICD-10-CM

## 2022-07-26 ENCOUNTER — Encounter: Payer: Self-pay | Admitting: Family Medicine

## 2022-07-27 ENCOUNTER — Telehealth (INDEPENDENT_AMBULATORY_CARE_PROVIDER_SITE_OTHER): Payer: 59 | Admitting: Family Medicine

## 2022-07-27 DIAGNOSIS — F5101 Primary insomnia: Secondary | ICD-10-CM

## 2022-07-27 MED ORDER — DAYVIGO 10 MG PO TABS: 1.0000 | ORAL_TABLET | Freq: Every evening | ORAL | 5 refills | Status: AC | PRN

## 2022-07-27 NOTE — Progress Notes (Signed)
Virtual Visit via Telephone Note  I connected with [name] on 07/27/22 at 11:00 AM EDT by telephone and verified that I am speaking with the correct person using two identifiers.  Location: Patient: home Provider: office   I discussed the limitations of evaluation and management by telemedicine and the availability of in person appointments. The patient expressed understanding and agreed to proceed.  History of Present Illness: Chief Complaint  Patient presents with   Follow-up    [name] yearly prior authorization   Patient follow-up on insomnia.  Patient has insomnia for 30 years. She patient hs been taking [name] 10 mg nightly night for 5 years. It is improves insomnia. She is tolerating it well without side effects. She has tried amitriptyline and Ambien in the past, but did not tolerate them due to lack of improvement and worsening migraines.    Observations/Objective: Resp: no wheezing, able to   Assessment and Plan: Problem List Items Addressed This Visit       Other   Insomnia - Primary    Chronic, stable Continue day Vigo 10 mg nightly prn, refilled Follow-up in 6 months      Relevant Medications   Lemborexant ([name]) 10 MG TABS     Follow Up Instructions:    I discussed the assessment and treatment plan with the patient. The patient was provided an opportunity to ask questions and all were answered. The patient agreed with the plan and demonstrated an understanding of the instructions.   The patient was advised to call back or seek an in-person evaluation if the symptoms worsen or if the condition fails to improve as anticipated.  I provided 15 minutes of non-face-to-face time during this encounter.   Bonnita Hollow, MD

## 2022-07-27 NOTE — Assessment & Plan Note (Signed)
Chronic, stable Continue day Vigo 10 mg nightly prn, refilled Follow-up in 6 months

## 2022-08-08 ENCOUNTER — Ambulatory Visit
Admission: RE | Admit: 2022-08-08 | Discharge: 2022-08-08 | Disposition: A | Discharge status: Home / Self Care | Payer: 59 | Source: Ambulatory Visit | Attending: Neurological Surgery | Admitting: Neurological Surgery

## 2022-08-08 DIAGNOSIS — M5416 Radiculopathy, lumbar region: Secondary | ICD-10-CM

## 2022-08-08 MED ORDER — GADOPICLENOL 0.5 MMOL/ML IV SOLN
7.5000 mL | Freq: Once | INTRAVENOUS | Status: AC | PRN
  Administered 2022-08-08: 7.5 mL via INTRAVENOUS

## 2022-08-12 DIAGNOSIS — M5416 Radiculopathy, lumbar region: Secondary | ICD-10-CM

## 2022-08-12 HISTORY — DX: Radiculopathy, lumbar region: M54.16

## 2023-01-31 ENCOUNTER — Telehealth: Payer: Self-pay

## 2023-01-31 NOTE — Telephone Encounter (Signed)
Received medication refill for [name] 10 mg tablet to be sent to [facility].   LOV:07/27/2022.   Contacted Patient and scheduled them on 02/01/2023 at 02:20pm for 6 month insomnia follow up  with PCP.  Ok for refill?

## 2023-02-01 ENCOUNTER — Encounter: Payer: Self-pay | Admitting: Family Medicine

## 2023-02-01 ENCOUNTER — Ambulatory Visit: Payer: 59 | Admitting: Family Medicine

## 2023-02-01 VITALS — BP 116/78 | HR 62 | Temp 97.5°F | Wt 189.4 lb

## 2023-02-01 DIAGNOSIS — W57XXXA Bitten or stung by nonvenomous insect and other nonvenomous arthropods, initial encounter: Secondary | ICD-10-CM | POA: Insufficient documentation

## 2023-02-01 DIAGNOSIS — S20361A Insect bite (nonvenomous) of right front wall of thorax, initial encounter: Secondary | ICD-10-CM

## 2023-02-01 DIAGNOSIS — G8929 Other chronic pain: Secondary | ICD-10-CM

## 2023-02-01 DIAGNOSIS — M5442 Lumbago with sciatica, left side: Secondary | ICD-10-CM

## 2023-02-01 DIAGNOSIS — G43709 Chronic migraine without aura, not intractable, without status migrainosus: Secondary | ICD-10-CM

## 2023-02-01 DIAGNOSIS — M25552 Pain in left hip: Secondary | ICD-10-CM | POA: Diagnosis not present

## 2023-02-01 DIAGNOSIS — K3184 Gastroparesis: Secondary | ICD-10-CM | POA: Diagnosis not present

## 2023-02-01 DIAGNOSIS — G4700 Insomnia, unspecified: Secondary | ICD-10-CM

## 2023-02-01 DIAGNOSIS — E669 Obesity, unspecified: Secondary | ICD-10-CM | POA: Diagnosis not present

## 2023-02-01 MED ORDER — TRIAMCINOLONE ACETONIDE 0.1 % EX CREA: 1.0000 | TOPICAL_CREAM | Freq: Two times a day (BID) | CUTANEOUS | 0 refills | Status: DC

## 2023-02-01 MED ORDER — IBUPROFEN 200 MG PO TABS: 400.0000 mg | ORAL_TABLET | ORAL | 0 refills | Status: DC | PRN

## 2023-02-01 MED ORDER — DAYVIGO 10 MG PO TABS: 10.0000 mg | ORAL_TABLET | Freq: Every evening | ORAL | 5 refills | Status: AC | PRN

## 2023-02-01 NOTE — Progress Notes (Unsigned)
Assessment/Plan:   Problem List Items Addressed This Visit       Cardiovascular and Mediastinum   Chronic migraine without aura   Relevant Medications   ibuprofen (ADVIL) 200 MG tablet   tiZANidine (ZANAFLEX) 4 MG capsule     Digestive   Gastroparesis     Nervous and Auditory   Chronic midline low back pain with left-sided sciatica   Relevant Medications   ibuprofen (ADVIL) 200 MG tablet   diazepam (VALIUM) 10 MG tablet   Lemborexant ([name]) 10 MG TABS   tiZANidine (ZANAFLEX) 4 MG capsule     Musculoskeletal and Integument   Tick bite of right front wall of thorax   Relevant Medications   triamcinolone cream (KENALOG) 0.1 %     Other   Insomnia   Relevant Medications   Lemborexant ([name]) 10 MG TABS   Class 1 obesity in adult   Relevant Orders   Amb Referral to Bariatric Surgery   Acute hip pain, left - Primary   Relevant Medications   ibuprofen (ADVIL) 200 MG tablet  Offered hip xray and orthopedic referral, patient declined at this moment  Medications Discontinued During This Encounter  Medication Reason   tiZANidine (ZANAFLEX) 4 MG capsule    tiZANidine (ZANAFLEX) 4 MG capsule Reorder    No follow-ups on file.    Subjective:   Encounter date: 02/01/2023  [name] is a 57 y.o. female who has Chronic migraine without aura; Irritable bowel syndrome; Stricture of sigmoid colon; Diverticulitis; MCI (mild cognitive impairment); Insomnia; Anxiety; Hematochezia; Chronic midline low back pain with left-sided sciatica; Tick bite of right front wall of thorax; Gastroparesis; Class 1 obesity in adult; and Acute hip pain, left on their problem list..   She  has a past medical history of Allergy, Anxiety, Arthritis, Depression, Dizziness, Family history of malignant neoplasm of digestive organs (12/21/2021), Gastroparesis, GERD (gastroesophageal reflux disease), History of colonic diverticulitis - recurrent with stricture (12/21/2021), Lumbar radiculopathy  (08/12/2022), Migraine, PONV (postoperative nausea and vomiting), and Weight decrease..   She presents with chief complaint of Medical Management of Chronic Issues (Insomnia 6 month follow up. [name] 10 mg refill.) .   HPI:   Review of Systems  Constitutional:  Negative for fever.  Musculoskeletal:  Positive for back pain (Chronic, unchanged) and joint pain (Left hip pain).  Skin:  Positive for itching (Right upper chest tick bite).  Psychiatric/Behavioral:  The patient has insomnia (Stable on [name]).     Past Surgical History:  Procedure Laterality Date   ABDOMINAL HYSTERECTOMY     abdominalplasty     s/p weight loss   BLOOD PATCH  2019   brachiplasty     s/p weight loss    CHOLECYSTECTOMY  09/04/2012   Procedure: LAPAROSCOPIC CHOLECYSTECTOMY;  Surgeon: Shelly Rubenstein, MD;  Location: [facility];  Service: General;  Laterality: N/A;   COLONOSCOPY  08/21/12   few scattered sigmoid diverticula: one sessile right colon polyp removed by cold biopsies x2:  patchy erythema in the left colon multiple random biopsies done:  normal therminal ileum   COSMETIC SURGERY     KNEE ARTHROSCOPY W/ ACL RECONSTRUCTION     in early 1990's   LUMBAR PUNCTURE  2019   TONSILLECTOMY      Outpatient Medications Prior to Visit  Medication Sig Dispense Refill   buPROPion (WELLBUTRIN SR) 200 MG 12 hr tablet      dexlansoprazole (DEXILANT) 60 MG capsule Take 60 mg by mouth daily.  diazepam (VALIUM) 10 MG tablet Take 10 mg by mouth every 6 (six) hours as needed for anxiety.     donepezil (ARICEPT) 5 MG tablet Take 5 mg by mouth daily.     rizatriptan (MAXALT) 10 MG tablet Take 10 mg by mouth See admin instructions. Take 1 tablet (10 mg) by mouth as needed for migraine headache, May repeat in 2 hours if needed     tiZANidine (ZANAFLEX) 4 MG capsule Take 4 mg by mouth 3 (three) times daily as needed for muscle spasms.     tiZANidine (ZANAFLEX) 4 MG capsule Take 4 mg by mouth 3 (three) times daily as  needed for muscle spasms.     DULoxetine (CYMBALTA) 20 MG capsule Take 1 capsule (20 mg total) by mouth daily. 30 capsule 0   tiZANidine (ZANAFLEX) 4 MG capsule Take 1 capsule (4 mg total) by mouth 3 (three) times daily as needed for muscle spasms.     No facility-administered medications prior to visit.    Family History  Problem Relation Age of Onset   Diabetes Mother    Hypertension Mother    Thyroid disease Mother    Cancer Father    Diabetes Father    Hypertension Father    Thyroid disease Sister    Diabetes Brother    Diabetes Maternal Grandmother    Cancer Maternal Grandfather     Social History   Socioeconomic History   Marital status: Married    Spouse name: Not on file   Number of children: 2   Years of education: 14   Highest education level: Some college, no degree  Occupational History   Not on file  Tobacco Use   Smoking status: Never   Smokeless tobacco: Never  Vaping Use   Vaping Use: Never used  Substance and Sexual Activity   Alcohol use: No   Drug use: No   Sexual activity: Not Currently  Other Topics Concern   Not on file  Social History Narrative   Lives at home with husband and children   Social Determinants of Health   Financial Resource Strain: Not on file  Food Insecurity: Not on file  Transportation Needs: Not on file  Physical Activity: Not on file  Stress: Not on file  Social Connections: Not on file  Intimate Partner Violence: Not on file                                                                                                  Objective:  Physical Exam: BP 116/78 (BP Location: Left Arm, Patient Position: Sitting, Cuff Size: Large)   Pulse 62   Temp (!) 97.5 F (36.4 C) (Temporal)   Wt 189 lb 6.4 oz (85.9 kg)   SpO2 99%   BMI 32.51 kg/m     Physical Exam Constitutional:      General: She is not in acute distress.    Appearance: Normal appearance. She is not ill-appearing or toxic-appearing.  HENT:     Head:  Normocephalic and atraumatic.     Nose: Nose normal. No congestion.  Eyes:  General: No scleral icterus.    Extraocular Movements: Extraocular movements intact.  Cardiovascular:     Rate and Rhythm: Normal rate and regular rhythm.     Pulses: Normal pulses.     Heart sounds: Normal heart sounds.  Pulmonary:     Effort: Pulmonary effort is normal. No respiratory distress.     Breath sounds: Normal breath sounds.  Abdominal:     General: Abdomen is flat. Bowel sounds are normal.     Palpations: Abdomen is soft.  Musculoskeletal:     Left hip: No tenderness or crepitus. Normal range of motion.     Comments: Negative FABER and FADIR on left  Lymphadenopathy:     Cervical: No cervical adenopathy.  Skin:    General: Skin is warm and dry.     Findings: Erythema and rash present. Rash is nodular (Right upper chest) and scaling.  Neurological:     General: No focal deficit present.     Mental Status: She is alert and oriented to person, place, and time. Mental status is at baseline.  Psychiatric:        Mood and Affect: Mood normal.        Behavior: Behavior normal.        Thought Content: Thought content normal.        Judgment: Judgment normal.     No results found.  No results found for this or any previous visit (from the past 2160 hour(s)).      Garner Nash, MD, MS

## 2023-02-03 NOTE — Assessment & Plan Note (Signed)
Patient with obesity and history of gastroparesis, raising concern about side effects of GLP-1's.  Recommend referral to bariatric medicine to discuss options further.

## 2023-02-03 NOTE — Assessment & Plan Note (Signed)
The affected area still presents with itching and erythema, but systemic symptoms are absent.   Plan:  Apply triamcinolone cream (0.1%) topically to reduce local irritation and inflammation. Monitor for any development of systemic symptoms.

## 2023-02-03 NOTE — Assessment & Plan Note (Signed)
The hip pain has been worsening over 4 weeks, physical examination did not suggest bony abnormalities.   Differential diagnosis:  Myofascial pain Ligamentous sprain/strain   Plan: Consider over-the-counter ibuprofen for pain relief. Continue home exercises and stretches as advised by a physical therapist. Frequent re-evaluation based on symptoms; imaging deferred at this time by patient preference. Discuss referral for orthopedic evaluation if no resolution or worsening symptoms occur.

## 2023-02-03 NOTE — Assessment & Plan Note (Signed)
Status unchanged from baseline.   Plan:  Continue current migraine management strategies including rizatriptan as needed.

## 2023-02-03 NOTE — Assessment & Plan Note (Signed)
Chronic, stable Continue day Vigo 10 mg nightly prn, refilled Follow-up in 6 months 

## 2023-03-22 DIAGNOSIS — Z0289 Encounter for other administrative examinations: Secondary | ICD-10-CM

## 2023-03-23 ENCOUNTER — Encounter (INDEPENDENT_AMBULATORY_CARE_PROVIDER_SITE_OTHER): Payer: Self-pay | Admitting: Adult Health

## 2023-03-23 ENCOUNTER — Ambulatory Visit (INDEPENDENT_AMBULATORY_CARE_PROVIDER_SITE_OTHER): Payer: 59 | Admitting: Adult Health

## 2023-03-23 VITALS — BP 91/62 | HR 89 | Temp 97.4°F | Ht 64.0 in | Wt 193.0 lb

## 2023-03-23 DIAGNOSIS — K3184 Gastroparesis: Secondary | ICD-10-CM | POA: Diagnosis not present

## 2023-03-23 DIAGNOSIS — E669 Obesity, unspecified: Secondary | ICD-10-CM | POA: Diagnosis not present

## 2023-03-23 DIAGNOSIS — Z6833 Body mass index (BMI) 33.0-33.9, adult: Secondary | ICD-10-CM | POA: Diagnosis not present

## 2023-03-23 NOTE — Progress Notes (Signed)
Office: [phone]  /  Fax: [phone]   Initial Visit  [name] was seen in clinic today to evaluate for obesity. She is interested in losing weight to improve overall health and reduce the risk of weight related complications. She presents today to review program treatment options, initial physical assessment, and evaluation.     She was referred by: PCP  When asked what else they would like to accomplish? She states: Improve appearance, Improve self-confidence, and Lose a target amount of weight : Loss down to 160 lbs, currently 193 lbs  Weight history: She reports gaining weight since after high school  When asked how has your weight affected you? She states: Has affected self-esteem, Having fatigue, Having poor endurance, and Other: Poor sleep, ie; frequent waking  Some associated conditions: None  Contributing factors: Stress, Reduced physical activity, and Eating patterns  Weight promoting medications identified: Other: Zanaflex for chronic back pain  Current nutrition plan: None and Other: Modified 56 Day Plan  Current level of physical activity: Walking and NEAT  Current or previous pharmacotherapy: Buproprion and Other: Naltrexone  Response to medication: Other: Naltrexone caused increased Migraine HA activity   Past medical history includes:   Past Medical History:  Diagnosis Date   Allergy    Anxiety    Arthritis    Depression    Dizziness    Family history of malignant neoplasm of digestive organs 12/21/2021   Gastroparesis    [facility]   GERD (gastroesophageal reflux disease)    History of colonic diverticulitis - recurrent with stricture 12/21/2021   Lumbar radiculopathy 08/12/2022   Marikay Alar, MD  [facility] [place])   Migraine    [facility]   PONV (postoperative nausea and vomiting)    Weight decrease      Objective:   BP 91/62   Pulse 89   Temp (!) 97.4 F (36.3 C)   Ht 5\' 4"  (1.626 m)   Wt 193 lb  (87.5 kg)   SpO2 100%   BMI 33.13 kg/m  She was weighed on the bioimpedance scale: Body mass index is 33.13 kg/m.  Peak Weight:220 , Body Fat%:44.3, Visceral Fat Rating:12, Weight trend over the last 12 months: Increasing  General:  Alert, oriented and cooperative. Patient is in no acute distress.  Respiratory: Normal respiratory effort, no problems with respiration noted   Gait: able to ambulate independently  Mental Status: Normal mood and affect. Normal behavior. Normal judgment and thought content.   DIAGNOSTIC DATA REVIEWED:  BMET    Component Value Date/Time   NA 137 07/18/2019 1316   NA 142 06/05/2018 0837   K 4.2 07/18/2019 1316   CL 101 07/18/2019 1316   CO2 26 07/18/2019 1316   GLUCOSE 99 07/18/2019 1316   BUN 15 07/18/2019 1316   BUN 12 06/05/2018 0837   CREATININE 0.70 07/18/2019 1316   CREATININE 0.83 07/27/2012 1202   CALCIUM 10.0 07/18/2019 1316   GFRNONAA 95 06/05/2018 0837   GFRAA 110 06/05/2018 0837   Lab Results  Component Value Date   HGBA1C 4.7 07/27/2012   No results found for: "INSULIN" CBC    Component Value Date/Time   WBC 7.2 06/05/2018 0837   WBC 12.7 (H) 05/30/2017 1615   RBC 4.31 06/05/2018 0837   RBC 4.52 05/30/2017 1615   HGB 13.9 06/05/2018 0837   HCT 41.5 06/05/2018 0837   PLT 269 06/05/2018 0837   MCV 96 06/05/2018 0837   MCH 32.3 06/05/2018  0837   MCH 32.5 05/30/2017 1615   MCHC 33.5 06/05/2018 0837   MCHC 33.6 05/30/2017 1615   RDW 12.0 (L) 06/05/2018 0837   Iron/TIBC/Ferritin/ %Sat    Component Value Date/Time   IRON 76 07/18/2019 1316   TIBC 343 07/18/2019 1316   FERRITIN 111 07/18/2019 1316   IRONPCTSAT 22 07/18/2019 1316   Lipid Panel  No results found for: "CHOL", "TRIG", "HDL", "CHOLHDL", "VLDL", "LDLCALC", "LDLDIRECT" Hepatic Function Panel     Component Value Date/Time   PROT 7.1 07/18/2019 1316   PROT 6.5 06/05/2018 0837   ALBUMIN 4.6 07/18/2019 1316   ALBUMIN 4.4 06/05/2018 0837   AST 17 07/18/2019  1316   ALT 18 07/18/2019 1316   ALKPHOS 138 (H) 07/18/2019 1316   BILITOT 0.5 07/18/2019 1316   BILITOT 0.2 06/05/2018 0837      Component Value Date/Time   TSH 0.78 07/18/2019 1316     Assessment and Plan:   Gastroparesis  Obesity (BMI 30-39.9), Starting BMI 33.2  ESTABLISH WITH HWW    Obesity Treatment / Action Plan:  Patient will work on garnering support from family and friends to begin weight loss journey. Will work on eliminating or reducing the presence of highly palatable, calorie dense foods in the home. Will complete provided nutritional and psychosocial assessment questionnaire before the next appointment. Will be scheduled for indirect calorimetry to determine resting energy expenditure in a fasting state.  This will allow [place] to create a reduced calorie, high-protein meal plan to promote loss of fat mass while preserving muscle mass. Counseled on the health benefits of losing 5%-15% of total body weight. Was counseled on nutritional approaches to weight loss and benefits of reducing processed foods and consuming plant-based foods and high quality protein as part of nutritional weight management. Was counseled on pharmacotherapy and role as an adjunct in weight management.   Obesity Education Performed Today:  She was weighed on the bioimpedance scale and results were discussed and documented in the synopsis.  We discussed obesity as a disease and the importance of a more detailed evaluation of all the factors contributing to the disease.  We discussed the importance of long term lifestyle changes which include nutrition, exercise and behavioral modifications as well as the importance of customizing this to her specific health and social needs.  We discussed the benefits of reaching a healthier weight to alleviate the symptoms of existing conditions and reduce the risks of the biomechanical, metabolic and psychological effects of obesity.  [name] appears to  be in the action stage of change and states they are ready to start intensive lifestyle modifications and behavioral modifications.  30 minutes was spent today on this visit including the above counseling, pre-visit chart review, and post-visit documentation.  Reviewed by clinician on day of visit: allergies, medications, problem list, medical history, surgical history, family history, social history, and previous encounter notes pertinent to obesity diagnosis.   Previn Jian d. Taria Castrillo, NP-C

## 2023-04-04 ENCOUNTER — Other Ambulatory Visit (HOSPITAL_COMMUNITY): Payer: Self-pay | Admitting: Student

## 2023-04-04 DIAGNOSIS — R413 Other amnesia: Secondary | ICD-10-CM

## 2023-04-12 ENCOUNTER — Ambulatory Visit (HOSPITAL_COMMUNITY)
Admission: RE | Admit: 2023-04-12 | Discharge: 2023-04-12 | Disposition: A | Discharge status: Home / Self Care | Payer: 59 | Source: Ambulatory Visit | Attending: Student | Admitting: Student

## 2023-04-12 DIAGNOSIS — R413 Other amnesia: Secondary | ICD-10-CM | POA: Diagnosis present

## 2023-04-19 ENCOUNTER — Ambulatory Visit (INDEPENDENT_AMBULATORY_CARE_PROVIDER_SITE_OTHER): Payer: 59 | Admitting: Family Medicine

## 2023-04-19 ENCOUNTER — Encounter (INDEPENDENT_AMBULATORY_CARE_PROVIDER_SITE_OTHER): Payer: Self-pay | Admitting: Family Medicine

## 2023-04-19 VITALS — BP 104/72 | HR 61 | Temp 98.0°F | Ht 64.0 in | Wt 195.0 lb

## 2023-04-19 DIAGNOSIS — R5383 Other fatigue: Secondary | ICD-10-CM | POA: Insufficient documentation

## 2023-04-19 DIAGNOSIS — K3184 Gastroparesis: Secondary | ICD-10-CM

## 2023-04-19 DIAGNOSIS — Z1331 Encounter for screening for depression: Secondary | ICD-10-CM | POA: Diagnosis not present

## 2023-04-19 DIAGNOSIS — F32A Depression, unspecified: Secondary | ICD-10-CM | POA: Insufficient documentation

## 2023-04-19 DIAGNOSIS — M5416 Radiculopathy, lumbar region: Secondary | ICD-10-CM

## 2023-04-19 DIAGNOSIS — G3184 Mild cognitive impairment, so stated: Secondary | ICD-10-CM

## 2023-04-19 DIAGNOSIS — F419 Anxiety disorder, unspecified: Secondary | ICD-10-CM

## 2023-04-19 DIAGNOSIS — E669 Obesity, unspecified: Secondary | ICD-10-CM

## 2023-04-19 DIAGNOSIS — R0602 Shortness of breath: Secondary | ICD-10-CM

## 2023-04-19 DIAGNOSIS — R7303 Prediabetes: Secondary | ICD-10-CM | POA: Diagnosis not present

## 2023-04-19 DIAGNOSIS — Z6833 Body mass index (BMI) 33.0-33.9, adult: Secondary | ICD-10-CM

## 2023-04-19 NOTE — Progress Notes (Signed)
Chief Complaint:   OBESITY [name] (MR# [id number]) is a 57 y.o. female who presents for evaluation and treatment of obesity and related comorbidities. Current BMI is Body mass index is 33.47 kg/m. [name] has been struggling with her weight for many years and has been unsuccessful in either losing weight, maintaining weight loss, or reaching her healthy weight goal.  [name] is currently in the action stage of change and ready to dedicate time achieving and maintaining a healthier weight. [name] is interested in becoming our patient and working on intensive lifestyle modifications including (but not limited to) diet and exercise for weight loss.  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  Lives with her husband and 2 grown kids.  Averaging 4000 steps per day.  Diet is limited by diverticulitis and gastroparesis.  Admits to overeating and stress eating.  Lost down to 150 pounds on Next 50 day last year.  Regained weight after back flareup.  [name]'s habits were reviewed today and are as follows: Her family eats meals together, she thinks her family will eat healthier with her, her desired weight loss is 35 lbs, she has been heavy most of her life, she started gaining weight during childhood, her heaviest weight ever was 220 pounds, she is a picky eater and doesn't like to eat healthier foods, she has significant food cravings issues, she snacks frequently in the evenings, she skips meals frequently, she frequently makes poor food choices, she frequently eats larger portions than normal, and she struggles with emotional eating.  Depression Screen [name]'s Food and Mood (modified PHQ-9) score was 12.  Subjective:   1. Other fatigue [name] admits to daytime somnolence and admits to waking up still tired. Patient has a history of symptoms of daytime fatigue, morning fatigue, and morning headache. [name] generally gets 6 hours of sleep per night, and states that she has nightime awakenings.  Snoring is not present. Apneic episodes are not present. Epworth Sleepiness Score is 1.   2. SOBOE (shortness of breath on exertion) [name] notes increasing shortness of breath with exercising and seems to be worsening over time with weight gain. She notes getting out of breath sooner with activity than she used to. This has not gotten worse recently. [name] denies shortness of breath at rest or orthopnea.  3. Gastroparesis Idiopathic, diagnosed in 2014.  Patient cannot tolerate solid, dairy, and limits cheese.  4. Anxiety and depression Patient's bariatric PHQ-9 score is 12.  She is on Wellbutrin SR 200 mg every morning.  5. Pre-diabetes Patient has a history of prediabetes prior to losing weight in the past.  She has a family history of type 2 diabetes mellitus with her mom and dad.  6. MCI (mild cognitive impairment) Patient is managed by [facility] memory clinic.  She is on Aricept 10 mg once daily.  7. Lumbar radiculopathy Patient notes prolonged standing hurts.  She is status post L5-S1 microdiscectomy.  No limitations with exercise.  Assessment/Plan:   1. Other fatigue [name] does feel that her weight is causing her energy to be lower than it should be. Fatigue may be related to obesity, depression or many other causes. Labs will be ordered, and in the meanwhile, [name] will focus on self care including making healthy food choices, increasing physical activity and focusing on stress reduction.  - EKG 12-Lead - VITAMIN D 25 Hydroxy (Vit-D Deficiency, Fractures) - Comprehensive metabolic panel - Lipid panel - Vitamin B12 - CBC - TSH Rfx  on Abnormal to Free T4 - Hemoglobin A1c - Insulin, random - Folate  2. SOBOE (shortness of breath on exertion) [name] does feel that she gets out of breath more easily that she used to when she exercises. [name]'s shortness of breath appears to be obesity related and exercise induced. She has agreed to work on weight loss and gradually increase exercise  to treat her exercise induced shortness of breath. Will continue to monitor closely.  3. Gastroparesis Patient is to avoid solid, and greens.  Eat 4-5 small meals per day.  4. Anxiety and depression Patient will work on stress reduction, and improving sleep quality.  5. Pre-diabetes We will check labs today, and we will follow-up at her next office visit.  Patient will begin her prescribed meal plan.  - Hemoglobin A1c - Insulin, random  6. MCI (mild cognitive impairment) Patient will continue Aricept 10 mg once daily; treat underlying insulin resistance.   7. Lumbar radiculopathy Patient is to add in 30 minutes of light walking 3 days/week.  8. Depression screen [name] had a positive depression screening. Depression is commonly associated with obesity and often results in emotional eating behaviors. We will monitor this closely and work on CBT to help improve the non-hunger eating patterns. Referral to Psychology may be required if no improvement is seen as she continues in our clinic.  9. BMI 33.0-33.9,adult  10. Generalized Obesity with starting BMI 33.6 [name] is currently in the action stage of change and her goal is to continue with weight loss efforts. I recommend [name] begin the structured treatment plan as follows:  She has agreed to the Category 2 Plan.  100 calorie snack list was given.   Exercise goals: As is.    Behavioral modification strategies: increasing lean protein intake, increasing vegetables, increasing water intake, decreasing liquid calories, decreasing eating out, no skipping meals, meal planning and cooking strategies, keeping healthy foods in the home, better snacking choices, planning for success, and decreasing junk food.  She was informed of the importance of frequent follow-up visits to maximize her success with intensive lifestyle modifications for her multiple health conditions. She was informed we would discuss her lab results at her next visit  unless there is a critical issue that needs to be addressed sooner. [name] agreed to keep her next visit at the agreed upon time to discuss these results.  Objective:   Blood pressure 104/72, pulse 61, temperature 98 F (36.7 C), height 5\' 4"  (1.626 m), weight 195 lb (88.5 kg), SpO2 99 %. Body mass index is 33.47 kg/m.  EKG: Normal sinus rhythm, rate 84 BPM.  Indirect Calorimeter completed today shows a VO2 of 204 and a REE of 1411.  Her calculated basal metabolic rate is 9528 thus her basal metabolic rate is worse than expected.  General: Cooperative, alert, well developed, in no acute distress. HEENT: Conjunctivae and lids unremarkable. Cardiovascular: Regular rhythm.  Lungs: Normal work of breathing. Neurologic: No focal deficits.   Lab Results  Component Value Date   CREATININE 0.70 07/18/2019   BUN 15 07/18/2019   NA 137 07/18/2019   K 4.2 07/18/2019   CL 101 07/18/2019   CO2 26 07/18/2019   Lab Results  Component Value Date   ALT 18 07/18/2019   AST 17 07/18/2019   ALKPHOS 138 (H) 07/18/2019   BILITOT 0.5 07/18/2019   Lab Results  Component Value Date   HGBA1C 4.7 07/27/2012   No results found for: "INSULIN" Lab Results  Component Value Date  TSH 0.78 07/18/2019   No results found for: "CHOL", "HDL", "LDLCALC", "LDLDIRECT", "TRIG", "CHOLHDL" Lab Results  Component Value Date   WBC 7.2 06/05/2018   HGB 13.9 06/05/2018   HCT 41.5 06/05/2018   MCV 96 06/05/2018   PLT 269 06/05/2018   Lab Results  Component Value Date   IRON 76 07/18/2019   TIBC 343 07/18/2019   FERRITIN 111 07/18/2019   Attestation Statements:   Reviewed by clinician on day of visit: allergies, medications, problem list, medical history, surgical history, family history, social history, and previous encounter notes.  Time spent on visit including pre-visit chart review and post-visit charting and care was 45 minutes.   Trude Mcburney, am acting as transcriptionist for Seymour Bars,  DO.  I have reviewed the above documentation for accuracy and completeness, and I agree with the above. Seymour Bars, DO

## 2023-04-20 LAB — COMPREHENSIVE METABOLIC PANEL
ALT: 21 IU/L (ref 0–32)
AST: 17 IU/L (ref 0–40)
Albumin: 4.6 g/dL (ref 3.8–4.9)
Alkaline Phosphatase: 137 IU/L — ABNORMAL HIGH (ref 44–121)
BUN/Creatinine Ratio: 20 (ref 9–23)
BUN: 15 mg/dL (ref 6–24)
Bilirubin Total: 0.3 mg/dL (ref 0.0–1.2)
CO2: 25 mmol/L (ref 20–29)
Calcium: 10.1 mg/dL (ref 8.7–10.2)
Chloride: 100 mmol/L (ref 96–106)
Creatinine, Ser: 0.75 mg/dL (ref 0.57–1.00)
Globulin, Total: 2.6 g/dL (ref 1.5–4.5)
Glucose: 97 mg/dL (ref 70–99)
Potassium: 4.7 mmol/L (ref 3.5–5.2)
Sodium: 141 mmol/L (ref 134–144)
Total Protein: 7.2 g/dL (ref 6.0–8.5)
eGFR: 93 mL/min/{1.73_m2} (ref >59)

## 2023-04-20 LAB — LIPID PANEL
Chol/HDL Ratio: 4.3 ratio (ref 0.0–4.4)
Cholesterol, Total: 287 mg/dL — ABNORMAL HIGH (ref 100–199)
HDL: 66 mg/dL (ref >39)
LDL Chol Calc (NIH): 185 mg/dL — ABNORMAL HIGH (ref 0–99)
Triglycerides: 192 mg/dL — ABNORMAL HIGH (ref 0–149)
VLDL Cholesterol Cal: 36 mg/dL (ref 5–40)

## 2023-04-20 LAB — CBC
Hematocrit: 44.6 % (ref 34.0–46.6)
Hemoglobin: 14.7 g/dL (ref 11.1–15.9)
MCH: 31.7 pg (ref 26.6–33.0)
MCHC: 33 g/dL (ref 31.5–35.7)
MCV: 96 fL (ref 79–97)
Platelets: 285 10*3/uL (ref 150–450)
RBC: 4.64 x10E6/uL (ref 3.77–5.28)
RDW: 12.5 % (ref 11.7–15.4)
WBC: 8.1 10*3/uL (ref 3.4–10.8)

## 2023-04-20 LAB — TSH RFX ON ABNORMAL TO FREE T4: TSH: 1.35 u[IU]/mL (ref 0.450–4.500)

## 2023-04-20 LAB — VITAMIN D 25 HYDROXY (VIT D DEFICIENCY, FRACTURES): Vit D, 25-Hydroxy: 29 ng/mL — ABNORMAL LOW (ref 30.0–100.0)

## 2023-04-20 LAB — INSULIN, RANDOM: INSULIN: 17.8 u[IU]/mL (ref 2.6–24.9)

## 2023-04-20 LAB — HEMOGLOBIN A1C
Est. average glucose Bld gHb Est-mCnc: 108 mg/dL
Hgb A1c MFr Bld: 5.4 % (ref 4.8–5.6)

## 2023-04-20 LAB — FOLATE: Folate: 6.6 ng/mL (ref >3.0)

## 2023-04-20 LAB — VITAMIN B12: Vitamin B-12: 448 pg/mL (ref 232–1245)

## 2023-05-04 ENCOUNTER — Ambulatory Visit (INDEPENDENT_AMBULATORY_CARE_PROVIDER_SITE_OTHER): Payer: 59 | Admitting: Family Medicine

## 2023-05-04 ENCOUNTER — Encounter (INDEPENDENT_AMBULATORY_CARE_PROVIDER_SITE_OTHER): Payer: Self-pay | Admitting: Family Medicine

## 2023-05-04 VITALS — BP 96/71 | HR 100 | Temp 97.6°F | Ht 64.0 in | Wt 193.0 lb

## 2023-05-04 DIAGNOSIS — E559 Vitamin D deficiency, unspecified: Secondary | ICD-10-CM | POA: Diagnosis not present

## 2023-05-04 DIAGNOSIS — E669 Obesity, unspecified: Secondary | ICD-10-CM

## 2023-05-04 DIAGNOSIS — E782 Mixed hyperlipidemia: Secondary | ICD-10-CM

## 2023-05-04 DIAGNOSIS — K3184 Gastroparesis: Secondary | ICD-10-CM | POA: Diagnosis not present

## 2023-05-04 DIAGNOSIS — Z6833 Body mass index (BMI) 33.0-33.9, adult: Secondary | ICD-10-CM

## 2023-05-04 DIAGNOSIS — E88819 Insulin resistance, unspecified: Secondary | ICD-10-CM | POA: Diagnosis not present

## 2023-05-04 NOTE — Assessment & Plan Note (Signed)
Reviewed lab results with patient Fasting insulin high at 17.8 We discussed hyperinsulinemia and how this can lead to prediabetes and type II diabetes, increased hunger and cravings. She has not been able to ramp up exercise due to feeling poorly She is working to reduce intake of starches and sweets  We discussed possibly adding metformin while working on healthy lifestyle changes but she has had side effects of multiple different drugs and has lots of GI issues without it.  Will hold off on metformin use.

## 2023-05-04 NOTE — Assessment & Plan Note (Signed)
Lab Results  Component Value Date   CHOL 287 (H) 04/19/2023   HDL 66 04/19/2023   LDLCALC 185 (H) 04/19/2023   TRIG 192 (H) 04/19/2023   CHOLHDL 4.3 04/19/2023   Reviewed lab results.  She has + fam hx of HLD and personal hx of HLD that reportedly has improved with dietary changes in past.  She has been resistant to statin use.  Her 10 year risk score is low.  Recheck in 4-6 mos  The 10-year ASCVD risk score (Arnett DK, et al., 2019) is: 1.7%   Values used to calculate the score:     Age: 58 years     Sex: Female     Is Non-Hispanic African American: No     Diabetic: No     Tobacco smoker: No     Systolic Blood Pressure: 96 mmHg     Is BP treated: No     HDL Cholesterol: 66 mg/dL     Total Cholesterol: 287 mg/dL

## 2023-05-04 NOTE — Progress Notes (Signed)
Office: [phone]  /  Fax: [phone]  WEIGHT SUMMARY AND BIOMETRICS  Starting Date: 04/19/23  Starting Weight: 195lb   Weight Lost Since Last Visit: 2lb   Vitals Temp: 97.6 F (36.4 C) BP: 96/71 Pulse Rate: 100 SpO2: 97 %   Body Composition  Body Fat %: 42 % Fat Mass (lbs): 81.2 lbs Muscle Mass (lbs): 106.6 lbs Total Body Water (lbs): 74 lbs Visceral Fat Rating : 11     HPI  Chief Complaint: OBESITY  [name] is here to discuss her progress with her obesity treatment plan. She is on the the Category 2 Plan and states she is following her eating plan approximately 50 % of the time. She states she is exercising 30-45 minutes 3 times per week.   Interval History:  Since last office visit she is down 2 lb She is up 1.2 lb of muscle and down 3.4 lb of body fat in the past 2 weeks She is having a hard time with meal plan due to her gastroparesis and diverticulosis She is still eating out frequently She cannot tolerate any dairy Most recent flare up came after eating a brisket burger   Eating out guide given Reports previous SE on Topamax for migraines  Pharmacotherapy: none  PHYSICAL EXAM:  Blood pressure 96/71, pulse 100, temperature 97.6 F (36.4 C), height 5\' 4"  (1.626 m), weight 193 lb (87.5 kg), SpO2 97%. Body mass index is 33.13 kg/m.  General: She is overweight, cooperative, alert, well developed, and in no acute distress. PSYCH: Has normal mood, affect and thought process.   Lungs: Normal breathing effort, no conversational dyspnea.   ASSESSMENT AND PLAN  TREATMENT PLAN FOR OBESITY:  Recommended Dietary Goals  [name] is currently in the action stage of change. As such, her goal is to continue weight management plan. She has agreed to keeping a food journal and adhering to recommended goals of 1400 calories and 90g protein. -changed from cat 2 meal plan  Behavioral Intervention  We discussed the following Behavioral Modification  Strategies today: increasing lean protein intake, decreasing simple carbohydrates , increasing vegetables, increasing lower glycemic fruits, increasing water intake, work on meal planning and preparation, keeping healthy foods at home, continue to practice mindfulness when eating, and planning for success. - avoid high fat foods that are contributing to slowed gastric emptying - limit food volumes of high fiber foods - eat 4-5 small meals per day  Additional resources provided today: NA  Recommended Physical Activity Goals  [name] has been advised to work up to 150 minutes of moderate intensity aerobic activity a week and strengthening exercises 2-3 times per week for cardiovascular health, weight loss maintenance and preservation of muscle mass.   She has agreed to Think about ways to increase daily physical activity and overcoming barriers to exercise  Pharmacotherapy changes for the treatment of obesity: none  ASSOCIATED CONDITIONS ADDRESSED TODAY  Gastroparesis Assessment & Plan: Awaiting a visit with [facility], scheduled for Feb. She reports having idiopathic gastroparesis x 10 years, greatly bothered by multiple food sources, not just timing and volume of food intake This has affected her ability to eat on plan Due to her gastroparesis, she does limit late night eating and large volumes of food This can also cause her to skip meals and graze more on snack foods  Stop prescribed meal plan due to complaints of worsening abdominal pain eating foods on plan Switch to dietary logging only Keep upcoming visit with GI   Mixed  hyperlipidemia Assessment & Plan: Lab Results  Component Value Date   CHOL 287 (H) 04/19/2023   HDL 66 04/19/2023   LDLCALC 185 (H) 04/19/2023   TRIG 192 (H) 04/19/2023   CHOLHDL 4.3 04/19/2023   Reviewed lab results.  She has + fam hx of HLD and personal hx of HLD that reportedly has improved with dietary changes in past.  She has been resistant to  statin use.  Her 10 year risk score is low.  Recheck in 4-6 mos  The 10-year ASCVD risk score (Arnett DK, et al., 2019) is: 1.7%   Values used to calculate the score:     Age: 57 years     Sex: Female     Is Non-Hispanic African American: No     Diabetic: No     Tobacco smoker: No     Systolic Blood Pressure: 96 mmHg     Is BP treated: No     HDL Cholesterol: 66 mg/dL     Total Cholesterol: 287 mg/dL    Insulin resistance Assessment & Plan: Reviewed lab results with patient Fasting insulin high at 17.8 We discussed hyperinsulinemia and how this can lead to prediabetes and type II diabetes, increased hunger and cravings. She has not been able to ramp up exercise due to feeling poorly She is working to reduce intake of starches and sweets  We discussed possibly adding metformin while working on healthy lifestyle changes but she has had side effects of multiple different drugs and has lots of GI issues without it.  Will hold off on metformin use.   Vitamin D deficiency Assessment & Plan: Last vitamin D Lab Results  Component Value Date   VD25OH 29.0 (L) 04/19/2023   Reviewed lab results with pt We discussed vitamin D's role in energy level, immune function and bone health. She is worried about SE on high dose vitamin D  Begin liquid vitamin D 5,000 international units  daily and recheck level in 4 mos   Generalized Obesity with starting BMI 33.6  BMI 33.0-33.9,adult      She was informed of the importance of frequent follow up visits to maximize her success with intensive lifestyle modifications for her multiple health conditions.   ATTESTASTION STATEMENTS:  Reviewed by clinician on day of visit: allergies, medications, problem list, medical history, surgical history, family history, social history, and previous encounter notes pertinent to obesity diagnosis.   I have personally spent 30 minutes total time today in preparation, patient care, nutritional counseling  and documentation for this visit, including the following: review of clinical lab tests; review of medical tests/procedures/services.      Betty Brink, DO DABFM, DABOM [facility] [address] [place], [place] [address] [phone]

## 2023-05-04 NOTE — Assessment & Plan Note (Signed)
Awaiting a visit with [facility], scheduled for Feb. She reports having idiopathic gastroparesis x 10 years, greatly bothered by multiple food sources, not just timing and volume of food intake This has affected her ability to eat on plan Due to her gastroparesis, she does limit late night eating and large volumes of food This can also cause her to skip meals and graze more on snack foods  Stop prescribed meal plan due to complaints of worsening abdominal pain eating foods on plan Switch to dietary logging only Keep upcoming visit with GI

## 2023-05-04 NOTE — Assessment & Plan Note (Signed)
Last vitamin D Lab Results  Component Value Date   VD25OH 29.0 (L) 04/19/2023   Reviewed lab results with pt We discussed vitamin D's role in energy level, immune function and bone health. She is worried about SE on high dose vitamin D  Begin liquid vitamin D 5,000 international units  daily and recheck level in 4 mos

## 2023-06-07 ENCOUNTER — Ambulatory Visit (INDEPENDENT_AMBULATORY_CARE_PROVIDER_SITE_OTHER): Payer: 59 | Admitting: Family Medicine

## 2023-08-01 ENCOUNTER — Telehealth (INDEPENDENT_AMBULATORY_CARE_PROVIDER_SITE_OTHER): Payer: 59 | Admitting: Family Medicine

## 2023-08-01 DIAGNOSIS — F5101 Primary insomnia: Secondary | ICD-10-CM

## 2023-08-01 DIAGNOSIS — M5416 Radiculopathy, lumbar region: Secondary | ICD-10-CM | POA: Diagnosis not present

## 2023-08-01 DIAGNOSIS — K3184 Gastroparesis: Secondary | ICD-10-CM

## 2023-08-01 DIAGNOSIS — G43709 Chronic migraine without aura, not intractable, without status migrainosus: Secondary | ICD-10-CM | POA: Diagnosis not present

## 2023-08-01 MED ORDER — LEMBOREXANT 10 MG PO TABS: 1.0000 | ORAL_TABLET | Freq: Every evening | ORAL | 5 refills | Status: DC | PRN

## 2023-08-01 NOTE — Progress Notes (Unsigned)
Virtual Visit via Video Note  I connected with [name] on 08/01/2023 at  9:20 AM EDT by a video enabled telemedicine application and verified that I am speaking with the correct person using two identifiers.  Location: Patient: home Provider: office   I discussed the limitations of evaluation and management by telemedicine and the availability of in person appointments. The patient expressed understanding and agreed to proceed.  History of Present Illness:  Chief Complaint  Patient presents with   Medical Management of Chronic Issues    6 month follow up and rx refill on dayvigo.    History of Present Illness (HPI):  Headache: Patient reports ongoing headaches. The patient notes that these headaches have been chronic and bothersome, currently uncontrolled. There is an associated history of nausea and gastroparesis affecting the patient's ability to eat. Follows with neurology  Gastroparesis: Patient has a known history of gastroparesis, which has been contributing to nausea and dietary restrictions. The patient described challenges in managing dietary intake due to the condition. The symptoms have been persistent and impacting daily life. Has upcoming follow up with new GI.   Lower Back Pain: The patient experienced chronic lower back pain. The pain was being managed with ibuprofen and tizanidine, which provided some relief. The patient did not report any new exacerbations during this visit.  Insomnia: Chronic insomnia has been stable with [name] (10 mg at night as needed) with stable results. The patient did not report any changes or new problems related to insomnia.  Review of Systems (ROS):  General: Positive for chronic headaches, nausea. Gastrointestinal: Positive for gastroparesis. Musculoskeletal: Positive for lower back pain. Neurological: Positive for headaches. Psychiatric: Positive for chronic insomnia. Remainder of ROS negative.  Observations/Objective: There  were no vitals filed for this visit.   Gen: NAD, resting comfortably HEENT: EOMI Pulm: NWOB Skin: no rash on face Neuro: no facial asymmetry or dysmetria Psych: Normal affect   Assessment and Plan: Problem List Items Addressed This Visit       Digestive   Gastroparesis, Idiopathic (Chronic)     Nervous and Auditory   Lumbar radiculopathy   Relevant Medications   Lemborexant 10 MG TABS     Other   Insomnia - Primary   Relevant Medications   Lemborexant 10 MG TABS    There are no discontinued medications.   Follow Up Instructions: No follow-ups on file.    I discussed the assessment and treatment plan with the patient. The patient was provided an opportunity to ask questions and all were answered. The patient agreed with the plan and demonstrated an understanding of the instructions.   The patient was advised to call back or seek an in-person evaluation if the symptoms worsen or if the condition fails to improve as anticipated.  I provided *** minutes of non-face-to-face time during this encounter.   Garnette Gunner, MD

## 2023-08-02 NOTE — Assessment & Plan Note (Signed)
Chronic, stable Continue day Vigo 10 mg nightly prn, refilled Follow-up in 6 months

## 2023-08-02 NOTE — Assessment & Plan Note (Signed)
Stable. Continue to montior

## 2023-08-02 NOTE — Assessment & Plan Note (Signed)
Continue to monitor symptoms and follow up with gastroenterology

## 2023-08-02 NOTE — Assessment & Plan Note (Signed)
Status unchanged from baseline.   Plan:  Continue current migraine management strategies including rizatriptan as needed.

## 2023-08-08 ENCOUNTER — Telehealth: Payer: Self-pay

## 2023-08-08 ENCOUNTER — Other Ambulatory Visit (HOSPITAL_COMMUNITY): Payer: Self-pay

## 2023-08-08 NOTE — Telephone Encounter (Signed)
Pharmacy Patient Advocate Encounter  Received notification from [facility] that Prior Authorization for [name] 10MG  tablets has been APPROVED from 08/08/23 to 08/07/24. Ran test claim, Copay is $0. This test claim was processed through [facility]- copay amounts may vary at other pharmacies due to pharmacy/plan contracts, or as the patient moves through the different stages of their insurance plan.   PA #/Case ID/Reference #: [id number]   Pharmacy aware

## 2023-08-08 NOTE — Telephone Encounter (Signed)
Pharmacy Patient Advocate Encounter   Received notification from CoverMyMeds that prior authorization for [name] 10MG  tablets is required/requested.   Insurance verification completed.   The patient is insured through [facility] .   Per test claim: PA required; PA submitted to [facility] via CoverMyMeds Key/confirmation #/EOC [id number] Status is pending

## 2023-08-12 ENCOUNTER — Encounter (INDEPENDENT_AMBULATORY_CARE_PROVIDER_SITE_OTHER): Payer: Self-pay | Admitting: Otolaryngology

## 2023-08-17 ENCOUNTER — Other Ambulatory Visit: Payer: Self-pay | Admitting: Obstetrics and Gynecology

## 2023-08-17 DIAGNOSIS — Z800 Family history of malignant neoplasm of digestive organs: Secondary | ICD-10-CM

## 2023-09-02 ENCOUNTER — Ambulatory Visit
Admission: RE | Admit: 2023-09-02 | Discharge: 2023-09-02 | Disposition: A | Discharge status: Home / Self Care | Payer: 59 | Source: Ambulatory Visit | Attending: Obstetrics and Gynecology | Admitting: Obstetrics and Gynecology

## 2023-09-02 DIAGNOSIS — Z800 Family history of malignant neoplasm of digestive organs: Secondary | ICD-10-CM

## 2023-09-02 MED ORDER — GADOPICLENOL 0.5 MMOL/ML IV SOLN
9.0000 mL | Freq: Once | INTRAVENOUS | Status: AC | PRN
  Administered 2023-09-02: 9 mL via INTRAVENOUS

## 2023-09-19 ENCOUNTER — Ambulatory Visit (INDEPENDENT_AMBULATORY_CARE_PROVIDER_SITE_OTHER): Payer: 59 | Admitting: Otolaryngology

## 2023-09-19 ENCOUNTER — Ambulatory Visit (INDEPENDENT_AMBULATORY_CARE_PROVIDER_SITE_OTHER): Payer: 59 | Admitting: Audiology

## 2023-09-19 ENCOUNTER — Encounter (INDEPENDENT_AMBULATORY_CARE_PROVIDER_SITE_OTHER): Payer: Self-pay

## 2023-09-19 VITALS — Ht 64.0 in | Wt 196.0 lb

## 2023-09-19 DIAGNOSIS — H903 Sensorineural hearing loss, bilateral: Secondary | ICD-10-CM | POA: Diagnosis not present

## 2023-09-19 DIAGNOSIS — R42 Dizziness and giddiness: Secondary | ICD-10-CM | POA: Insufficient documentation

## 2023-09-19 DIAGNOSIS — H9312 Tinnitus, left ear: Secondary | ICD-10-CM

## 2023-09-19 DIAGNOSIS — H8111 Benign paroxysmal vertigo, right ear: Secondary | ICD-10-CM

## 2023-09-19 NOTE — Progress Notes (Signed)
  [address] [place], [place] [address] [phone]  Audiological Evaluation    Name: [name]     DOB:   [birth year]      MRN:   [id number]                                                                                     Service Date: 09/19/2023     Accompanied by: unaccompanied   Patient comes today after Dr. Suszanne Conners, ENT sent a referral for a hearing evaluation due to concerns with hearing loss.   Symptoms Yes Details  Hearing loss  [x]  Both ears  Tinnitus  [x]  Left ear and doe snot correlate it with the vertigo  Ear pain/ Ear infections  []    Balance problems  [x]  Vertigo history, recently only is when she positions her head in the salon for a hair wash. Reports that only lasts seconds.  Noise exposure  []    Previous ear surgeries  []    Family history  [x]  Father and brother who also reportedly had noise exposure  Amplification  [x]  Never tried, reports is not ready  Other  []      Otoscopy: Right ear: Clear external ear canals and notable landmarks visualized on the tympanic membrane. Left ear:  Clear external ear canals and notable landmarks visualized on the tympanic membrane.  Tympanometry: Right ear: Type A- Normal external ear canal volume with normal middle ear pressure and tympanic membrane compliance Left ear: Type A- Normal external ear canal volume with normal middle ear pressure and tympanic membrane compliance    Pure tone Audiometry: Both ears: Normal hearing from 125-250 sloping from mild to moderate from [id number] Hz, rising to normal from 3000-8000 Hz.    The hearing test results were completed under headphones and results are deemed to be of good reliability. Test technique:  conventional     Speech Audiometry: Right ear- Speech Reception Threshold (SRT) was obtained at 35 dBHL Left ear-Speech Reception Threshold (SRT) was obtained at 30 dBHL   Word Recognition Score Tested using NU-6 (MLV) Right ear: 96% was obtained at a  presentation level of 70 dBHL with contralateral masking which is deemed as  excellent Left ear: 96% was obtained at a presentation level of 70 dBHL with contralateral masking which is deemed as  excellent    Impression: There is not a significant difference in pure-tone thresholds between ears. There is not a significant difference in the word recognition score in between ears.    Recommendations: Follow up with ENT as scheduled for today. Return for a hearing evaluation if concerns with hearing changes arise or per MD recommendation. Consider a communication needs assessment after medical clearance for hearing aids is obtained.   Marcus Groll MARIE LEROUX-MARTINEZ, AUD

## 2023-09-19 NOTE — Addendum Note (Signed)
Addended byNewman Pies on: 09/19/2023 05:12 PM   Modules accepted: Orders

## 2023-09-19 NOTE — Progress Notes (Signed)
Patient ID: [name], female   DOB: February 01, 1966, 57 y.o.   MRN: [id number]  CC: Recurrent dizziness  HPI:  [name] is a 57 y.o. female who presents today complaining of recurrent recurrent dizziness for several years.  She was previously diagnosed with bilateral benign paroxysmal positional vertigo.  She was treated by a physical therapist at [facility] on multiple occasions.  She was treated with multiple Epley maneuvers.  The treatments were initially successful.  However, lately, she has noted increasing dizziness, even after her most recent treatment.  In addition, she also complains of intermittent left ear tinnitus.  She describes the tinnitus as a high-pitched ringing noise.  It is nonpulsatile.  She denies any otalgia, otorrhea, or vertigo.  She has a history of hearing loss.  She underwent adenotonsillectomy surgery at age 23.  She has no other ENT surgery.  It should be noted that the patient has a history of early onset dementia.  She underwent multiple MRI scans.  No significant intracranial abnormality was noted.  She is currently on Aricept.  Past Medical History:  Diagnosis Date   Allergy    Anxiety    Arthritis    Back pain    Constipation    Depression    Dizziness    Family history of malignant neoplasm of digestive organs 12/21/2021   Gastroparesis    [facility]   GERD (gastroesophageal reflux disease)    Heartburn    High cholesterol    History of colonic diverticulitis - recurrent with stricture 12/21/2021   IBS (irritable bowel syndrome)    Lactose intolerance    Lumbar radiculopathy 08/12/2022   Betty Alar, MD  [facility] [place])   Migraine    [facility]   Osteoarthritis    PONV (postoperative nausea and vomiting)    Pre-diabetes    Stomach ulcer    Weight decrease     Past Surgical History:  Procedure Laterality Date   ABDOMINAL HYSTERECTOMY     abdominalplasty     s/p weight loss   BLOOD PATCH   2019   brachiplasty     s/p weight loss    CHOLECYSTECTOMY  09/04/2012   Procedure: LAPAROSCOPIC CHOLECYSTECTOMY;  Surgeon: Shelly Rubenstein, MD;  Location: [facility] OR;  Service: General;  Laterality: N/A;   COLONOSCOPY  08/21/12   few scattered sigmoid diverticula: one sessile right colon polyp removed by cold biopsies x2:  patchy erythema in the left colon multiple random biopsies done:  normal therminal ileum   COSMETIC SURGERY     KNEE ARTHROSCOPY W/ ACL RECONSTRUCTION     in early 1990's   LUMBAR PUNCTURE  2019   TONSILLECTOMY      Family History  Problem Relation Age of Onset   Hypertension Mother    Hyperlipidemia Mother    Diabetes Mother    Thyroid disease Mother    Kidney disease Mother    Depression Mother    Anxiety disorder Mother    Anxiety disorder Father    Stroke Father    Hyperlipidemia Father    Cancer Father    Diabetes Father    Hypertension Father    Thyroid disease Sister    Diabetes Brother    Diabetes Maternal Grandmother    Cancer Maternal Grandfather     Social History:  reports that she has never smoked. She has never used smokeless tobacco. She reports that she does not drink alcohol and does not  use drugs.  Allergies:  Allergies  Allergen Reactions   Codeine Nausea And Vomiting and Other (See Comments)    Severe Vomiting for days.  All forms of codeine, including synthetic.  Pt can take hydrocodone with food.   Lasmiditan Palpitations    Sleeplessness, Anxiety   Erythromycin Rash   Hydrocodone-Acetaminophen Nausea And Vomiting   Penicillins Diarrhea and Other (See Comments)    Has patient had a PCN reaction causing immediate rash, facial/tongue/throat swelling, SOB or lightheadedness with hypotension: No Has patient had a PCN reaction causing severe rash involving mucus membranes or skin necrosis: No Has patient had a PCN reaction that required hospitalization: No Has patient had a PCN reaction occurring within the last 10 years: No I    Methocarbamol Other (See Comments)    Other reaction(s): GI Bleed, Other (See Comments)   Gadavist [Gadobutrol] Itching, Nausea And Vomiting and Rash   Ondansetron Palpitations    Reaction to tablets - ok with I.V. Denies allergy     Prior to Admission medications   Medication Sig Start Date End Date Taking? Authorizing Provider  buPROPion (WELLBUTRIN SR) 200 MG 12 hr tablet    Yes [provider]  dexlansoprazole (DEXILANT) 60 MG capsule Take 60 mg by mouth daily.   Yes [provider]  donepezil (ARICEPT) 5 MG tablet Take 5 mg by mouth daily. 02/01/20  Yes [provider]  Lemborexant 10 MG TABS Take 1 tablet (10 mg total) by mouth at bedtime as needed (insomnia). 08/01/23 01/28/24 Yes Garnette Gunner, MD  ondansetron [facility]) 8 MG tablet  10/26/15  Yes [provider]  rizatriptan (MAXALT) 10 MG tablet Take 10 mg by mouth See admin instructions. Take 1 tablet (10 mg) by mouth as needed for migraine headache, May repeat in 2 hours if needed   Yes [provider]  tiZANidine (ZANAFLEX) 4 MG capsule Take 1 capsule (4 mg total) by mouth 3 (three) times daily as needed for muscle spasms. 02/01/23  Yes [provider]  diazepam (VALIUM) 10 MG tablet Take 10 mg by mouth every 6 (six) hours as needed for anxiety. Patient not taking: Reported on 08/01/2023    [provider]  nortriptyline (PAMELOR) 75 MG capsule Take 75 mg by mouth daily. Patient not taking: Reported on 08/01/2023 03/29/23   [provider]  Rimegepant Sulfate 75 MG TBDP 1 tablet by mouth as needed for headache rescue. Max of 75mg  in 24 hours Patient not taking: Reported on 08/01/2023 03/29/23   [provider]    Height 5\' 4"  (1.626 m), weight 196 lb (88.9 kg). Exam: General: Communicates without difficulty, well nourished, no acute distress. Head: Normocephalic, no evidence injury, no tenderness, facial buttresses intact without stepoff. Face/sinus:  No tenderness to palpation and percussion. Facial movement is normal and symmetric. Eyes: PERRL, EOMI. No scleral icterus, conjunctivae clear. Neuro: CN II exam reveals vision grossly intact.  No nystagmus at any point of gaze. Ears: Auricles well formed without lesions.  Ear canals are intact without mass or lesion.  No erythema or edema is appreciated.  The TMs are intact without fluid. Nose: External evaluation reveals normal support and skin without lesions.  Dorsum is intact.  Anterior rhinoscopy reveals congested mucosa over anterior aspect of inferior turbinates and intact septum.  No purulence noted. Oral:  Oral cavity and oropharynx are intact, symmetric, without erythema or edema.  Mucosa is moist without lesions. Neck: Full range of motion without pain.  There is no  significant lymphadenopathy.  No masses palpable.  Thyroid bed within normal limits to palpation.  Parotid glands and submandibular glands equal bilaterally without mass.  Trachea is midline. Neuro:  CN 2-12 grossly intact.  Vestibular: Dix-Hallpike produces rotational nystagmus with right-sided positioning. Vestibular: There is no nystagmus with pneumatic pressure on either tympanic membrane or Valsalva. The cerebellar examination is unremarkable.    Procedure: Right-sided Epley maneuver  Anesthesia: None Indication: To treat the right BPPV Desciption:  The patient is first placed in a right-sided Dix-Hallpike position. After the vertigo has subsided, the head is gradually rotated from the right to the left, completing a 180 turn. The patient is then returned to the upright position. The patient tolerated the procedure well without any difficulty.    Her hearing test shows bilateral symmetric mid frequency sensorineural hearing loss.  Assessment: 1.  Recurrent dizziness, secondary to her right benign paroxysmal positional vertigo.  Her right Dix-Hallpike maneuver today is positive. 2.  Her ear canals, tympanic membranes, and  middle ear spaces are normal. 3.  Bilateral mid frequency sensorineural hearing loss. 4.  Her intermittent left ear tinnitus may be secondary to her hearing loss.  Plan: 1.  The physical exam findings and the hearing test results are reviewed with the patient. 2.  The right Epley maneuver is performed today without difficulty. 3.  Post Epley activity restrictions are discussed with the patient. 4.  The pathophysiology of dizziness and BPPV are extensively discussed with the patient.  Questions are invited and answered. 5.  The strategies to cope with tinnitus, including the use of masker, hearing aids, tinnitus retraining therapy, and avoidance of caffeine and alcohol are discussed.  6.  The patient will return for reevaluation in 4 weeks.  Kalika Smay W Melissia Lahman 09/19/2023, 3:41 PM

## 2023-10-20 ENCOUNTER — Encounter (INDEPENDENT_AMBULATORY_CARE_PROVIDER_SITE_OTHER): Payer: Self-pay

## 2023-10-20 ENCOUNTER — Ambulatory Visit (INDEPENDENT_AMBULATORY_CARE_PROVIDER_SITE_OTHER): Payer: BC Managed Care – PPO | Admitting: Otolaryngology

## 2023-10-20 VITALS — HR 88

## 2023-10-20 DIAGNOSIS — H9312 Tinnitus, left ear: Secondary | ICD-10-CM | POA: Diagnosis not present

## 2023-10-20 DIAGNOSIS — R42 Dizziness and giddiness: Secondary | ICD-10-CM

## 2023-10-20 DIAGNOSIS — H8111 Benign paroxysmal vertigo, right ear: Secondary | ICD-10-CM

## 2023-10-21 NOTE — Progress Notes (Signed)
 Patient ID: [name], female   DOB: 06/22/1966, 58 y.o.   MRN: [id number]  Follow-up: Recurrent dizziness, left-sided tinnitus  HPI: The patient is a 58 year old female who returns today for her follow-up evaluation.  The patient was last seen 1 month ago.  At that time, she was complaining of recurrent dizziness for several years. She was previously diagnosed with bilateral benign paroxysmal positional vertigo. She was treated by a physical therapist at [facility] on multiple occasions. She was treated with multiple Epley maneuvers. The treatments were initially successful. However, she has recently became symptomatic.  At her last visit, her right Dix-Hallpike maneuver was positive.  In addition, the patient also complained of intermittent left ear tinnitus.  Her ear canals, tympanic membranes, and middle ear spaces were all normal.  She was treated with right-sided Epley maneuver without difficulty.  The patient returns today reporting intermittent dizziness.  She continues to have occasional spinning vertigo, especially when she turns to her right side while in a supine position.  She denies any otalgia, otorrhea, or recent change in her hearing.  She is still having intermittent left ear tinnitus.  Exam: General: Communicates without difficulty, well nourished, no acute distress. Head: Normocephalic, no evidence injury, no tenderness, facial buttresses intact without stepoff. Face/sinus: No tenderness to palpation and percussion. Facial movement is normal and symmetric. Eyes: PERRL, EOMI. No scleral icterus, conjunctivae clear. Neuro: CN II exam reveals vision grossly intact.  No nystagmus at any point of gaze. Ears: Auricles well formed without lesions.  Ear canals are intact without mass or lesion.  No erythema or edema is appreciated.  The TMs are intact without fluid. Nose: External evaluation reveals normal support and skin without lesions.  Dorsum is intact.  Anterior rhinoscopy  reveals congested mucosa over anterior aspect of inferior turbinates and intact septum.  No purulence noted. Oral:  Oral cavity and oropharynx are intact, symmetric, without erythema or edema.  Mucosa is moist without lesions. Neck: Full range of motion without pain.  There is no significant lymphadenopathy.  No masses palpable.  Thyroid  bed within normal limits to palpation.  Parotid glands and submandibular glands equal bilaterally without mass.  Trachea is midline. Neuro:  CN 2-12 grossly intact.  Vestibular: Dix-Hallpike produces rotational nystagmus with right-sided positioning. Vestibular: There is no nystagmus with pneumatic pressure on either tympanic membrane or Valsalva. The cerebellar examination is unremarkable.     Procedure: Right-sided Epley maneuver  Anesthesia: None Indication: To treat the right BPPV Desciption:  The patient is first placed in a right-sided Dix-Hallpike position. After the vertigo has subsided, the head is gradually rotated from the right to the left, completing a 180 turn. The patient is then returned to the upright position. The patient tolerated the procedure well without any difficulty.    Assessment: 1.  Recurrent dizziness, secondary to her right BPPV.  Her right Dix-Hallpike maneuver is again positive today.  However, the severity of her nystagmus has significantly reduced compared to 1 month ago. 2.  Subjective intermittent left ear tinnitus. 3.  Her ear canals, tympanic membranes, and middle ear spaces are normal.  Plan: 1.  The physical exam findings are reviewed with the patient. 2.  The pathophysiology of BPPV and dizziness are discussed with the patient.  Questions are invited and answered. 3.  The right Epley maneuver is performed today without difficulty. 4.  Post Epley activity restrictions are discussed. 5.  The patient will return for reevaluation in 1 month.

## 2023-11-09 ENCOUNTER — Encounter: Payer: Self-pay | Admitting: Family Medicine

## 2023-11-09 DIAGNOSIS — F5101 Primary insomnia: Secondary | ICD-10-CM

## 2023-11-10 ENCOUNTER — Other Ambulatory Visit (HOSPITAL_COMMUNITY): Payer: Self-pay

## 2023-11-10 ENCOUNTER — Telehealth: Payer: Self-pay

## 2023-11-10 MED ORDER — LEMBOREXANT 10 MG PO TABS: 1.0000 | ORAL_TABLET | Freq: Every evening | ORAL | 2 refills | Status: DC | PRN

## 2023-11-10 NOTE — Telephone Encounter (Signed)
Pharmacy Patient Advocate Encounter   Received notification from CoverMyMeds that prior authorization for [name] 10 mg talets is required/requested.   Insurance verification completed.   The patient is insured through [employer] .   Per test claim: PA required; PA started via CoverMyMeds. KEY [id number] . Waiting for clinical questions to populate.

## 2023-11-10 NOTE — Telephone Encounter (Signed)
Pharmacy Patient Advocate Encounter   Received notification from Pt Calls Messages that prior authorization for [name] 10mg  is required/requested.   Insurance verification completed.   The patient is insured through [facility] .   Per test claim: PA required; PA submitted to above mentioned insurance via CoverMyMeds Key/confirmation #/EOC [id number] Status is pending

## 2023-11-11 NOTE — Telephone Encounter (Signed)
Medication is denied due to medication not being on the list for her insurance to cover  also patient has been advised the denial will be faxed to the office medication that is covered under her plan she has tried them and they have failed    case number [id number]      phone number [phone] option 3 then 1     vanessa s called in

## 2023-11-15 NOTE — Telephone Encounter (Addendum)
BCBS form faxed successfully

## 2023-11-15 NOTE — Telephone Encounter (Signed)
 Pharmacy Patient Advocate Encounter  Received notification from [facility] that Prior Authorization for [name]  10 mg tablets has been DENIED.  Full denial letter will be uploaded to the media tab. See denial reason below.   PA #/Case ID/Reference #:  [id number]

## 2023-11-18 NOTE — Telephone Encounter (Signed)
Patient advised via mychart message.

## 2023-11-21 ENCOUNTER — Telehealth (INDEPENDENT_AMBULATORY_CARE_PROVIDER_SITE_OTHER): Payer: Self-pay | Admitting: Otolaryngology

## 2023-11-21 NOTE — Telephone Encounter (Signed)
 confirmed appt & location [id number] afm

## 2023-11-22 ENCOUNTER — Ambulatory Visit (INDEPENDENT_AMBULATORY_CARE_PROVIDER_SITE_OTHER): Payer: BC Managed Care – PPO

## 2023-12-01 ENCOUNTER — Telehealth: Payer: BC Managed Care – PPO | Admitting: Family Medicine

## 2023-12-01 DIAGNOSIS — F5101 Primary insomnia: Secondary | ICD-10-CM

## 2023-12-01 DIAGNOSIS — G3184 Mild cognitive impairment, so stated: Secondary | ICD-10-CM

## 2023-12-01 DIAGNOSIS — G43709 Chronic migraine without aura, not intractable, without status migrainosus: Secondary | ICD-10-CM

## 2023-12-01 DIAGNOSIS — F419 Anxiety disorder, unspecified: Secondary | ICD-10-CM

## 2023-12-01 MED ORDER — ESZOPICLONE 1 MG PO TABS: 1.0000 mg | ORAL_TABLET | Freq: Every evening | ORAL | 0 refills | Status: DC | PRN

## 2023-12-01 NOTE — Progress Notes (Signed)
Virtual Visit via Video Note  I connected with [name] on 12/01/2023 at 11:00 AM EST by a video enabled telemedicine application and verified that I am speaking with the correct person using two identifiers.  Location: Patient: hom Provider: office   I discussed the limitations of evaluation and management by telemedicine and the availability of in person appointments. The patient expressed understanding and agreed to proceed.  History of Present Illness:  Chief Complaint  Patient presents with   Medical Management of Chronic Issues    [name] alternatives due to denial.   [name] is a 58 year old female who presents with difficulty sleeping after discontinuing [name].  She is experiencing significant difficulty sleeping after discontinuing [name], which she had been using for eight to nine years. Since stopping [name], she struggles to fall asleep and stay asleep, often waking up around 3 AM and having trouble returning to sleep. She estimates getting only four to five hours of sleep per night, which is impacting her daily functioning and causing headaches.  She has a history of trying multiple medications for sleep, including amitriptyline, Restoril, Lunesta, Ambien, doxepin, trazodone, Belsomra, and Ramelteon, none of which have been effective or well-tolerated. Ambien caused significant next-day sedation, and gabapentin made her dizzy. She has not tolerated quetiapine (Seroquel) in the past and has a bottle from 15 years ago that she did not finish. Benzodiazepines, such as diazepam, do not help her sleep and are used infrequently for anxiety, about one pill in the last three months.  She experiences worsening migraines, which she attributes to lack of sleep and changes in barometric pressure. She cannot take her usual migraine medications as they now cause nausea and vomiting. She has previously used Botox injections for headache prevention.  Her past medical history  includes back surgery, after which she was prescribed trazodone and gabapentin for sleep and pain management, respectively. She reports a 'funny reaction' to medications, noting that common sedatives like Benadryl do not make her sleepy.  She has undergone sleep studies in the past, which were negative for sleep apnea. She has also been evaluated for mild cognitive impairment, with recent tests and blood work indicating no markers for Alzheimer's disease. She is actively managing three businesses and reports no cognitive difficulties in her work.        Observations/Objective: There were no vitals filed for this visit.   Gen: NAD, resting comfortably HEENT: EOMI Pulm: NWOB Skin: no rash on face Neuro: no facial asymmetry or dysmetria Psych: Normal affect   Assessment and Plan:  Chronic Insomnia Chronic insomnia with difficulty maintaining sleep, particularly waking up around 3 AM. Previous medications ([name], amitriptyline, Restoril, Lunesta, Ambien, doxepin, trazodone, Belsomra, Ramelteon) were ineffective or had adverse effects. Currently experiencing significant sleep disruption. Discussed Lunesta risks (dependency, respiratory depression with benzodiazepines, increased dementia risk) and potential benefit of CBT-I. - Prescribe Lunesta 1 mg tablets, take one tablet at night - Increase to 2 mg at night if no improvement after three days - Follow up in one month to assess efficacy and adjust dosage - Consider referral to sleep medicine if Lunesta is ineffective - Discuss sleep hygiene techniques - Consider CBT-I  Chronic Headaches Chronic headaches exacerbated by sleep deprivation and barometric pressure changes. Adverse reactions to migraine medications (nausea, vomiting). Previous Botox injections for prevention. - Resume Botox injections for headache prevention - Monitor for efficacy and adverse effects  General Health Maintenance Advised on sleep hygiene and potential  benefits of CBT-I  for chronic insomnia. - Discuss sleep hygiene techniques - Consider CBT-I  Follow-up - Follow up in one month to assess Lunesta effectiveness and adjust dosage if necessary.        Medications Discontinued During This Encounter  Medication Reason   Lemborexant 10 MG TABS    nortriptyline (PAMELOR) 75 MG capsule    Rimegepant Sulfate 75 MG TBDP    rizatriptan (MAXALT) 10 MG tablet        I discussed the assessment and treatment plan with the patient. The patient was provided an opportunity to ask questions and all were answered. The patient agreed with the plan and demonstrated an understanding of the instructions.   The patient was advised to call back or seek an in-person evaluation if the symptoms worsen or if the condition fails to improve as anticipated. Garnette Gunner, MD

## 2023-12-22 ENCOUNTER — Telehealth: Payer: Self-pay

## 2023-12-22 NOTE — Telephone Encounter (Signed)
 Copied from CRM [id number]. Topic: Clinical - Medication Question >> Dec 22, 2023 11:19 AM Sim Boast F wrote: Reason for CRM: Patient wants to let Dr. Janee Morn know that the [facility] medication for sleep is not working, she tried both the 1 and 2mg , wants to know if she can be prescribed the Quviviq medication instead? If so, please sent to [facility] [address] - [place], [place] - [address] [place]

## 2023-12-28 ENCOUNTER — Ambulatory Visit (INDEPENDENT_AMBULATORY_CARE_PROVIDER_SITE_OTHER): Admitting: Family Medicine

## 2023-12-28 VITALS — BP 126/82 | Wt 199.6 lb

## 2023-12-28 DIAGNOSIS — M5442 Lumbago with sciatica, left side: Secondary | ICD-10-CM

## 2023-12-28 DIAGNOSIS — G8929 Other chronic pain: Secondary | ICD-10-CM

## 2023-12-28 DIAGNOSIS — F5101 Primary insomnia: Secondary | ICD-10-CM

## 2023-12-28 DIAGNOSIS — H538 Other visual disturbances: Secondary | ICD-10-CM | POA: Diagnosis not present

## 2023-12-28 DIAGNOSIS — G43709 Chronic migraine without aura, not intractable, without status migrainosus: Secondary | ICD-10-CM | POA: Diagnosis not present

## 2023-12-28 MED ORDER — ZALEPLON 5 MG PO CAPS: 5.0000 mg | ORAL_CAPSULE | Freq: Every evening | ORAL | 0 refills | Status: DC | PRN

## 2023-12-28 MED ORDER — ZOLPIDEM TARTRATE ER 6.25 MG PO TBCR: 6.2500 mg | EXTENDED_RELEASE_TABLET | Freq: Every evening | ORAL | 0 refills | Status: DC | PRN

## 2023-12-28 NOTE — Progress Notes (Signed)
 Assessment/Plan:       Insomnia Chronic insomnia with difficulty initiating and maintaining sleep. Previous trials of Ambien, Belsomra, Lunesta, and Restoril with limited success. Recent Lunesta trial at increasing doses provided limited relief. Experiences frequent awakenings and insufficient sleep duration. Discussed Ambien extended release for sleep initiation and maintenance, and Sonata for nocturnal awakenings. Discussed Ambien side effects, including feeling like a 'zombie', and the need to discontinue if adverse effects occur. Open to retrying Ambien due to potential changes in body chemistry. If Ambien and Sonata are ineffective, willing to try Quviviq, paying out of pocket if necessary. - Prescribe Ambien 6.25 mg extended release for sleep initiation and maintenance. - Prescribe Sonata (zaleplon) 5 mg as needed for nocturnal awakenings. - Evaluate effectiveness of Ambien and consider alternatives if ineffective. - Consider Lennox Solders if current regimen is ineffective and willing to pay out of pocket.  Back Pain Acute exacerbation of back pain after bending over. Previous microdiscectomy. Current pain not radiating down the leg, suggesting non-disc-related. Previous Valium treatment without relief. Stomach ulcers limit NSAID use. Discussed potential orthopedic or neurosurgical consultation if symptoms persist. Prefers to wait for symptom improvement before specialist care. - Monitor symptoms and consider referral to orthopedics or neurosurgeon if no improvement in one week. - Avoid NSAIDs due to stomach ulcers.  Migraine Migraine exacerbation. Avoids sleep medications when using Maxalt and Zofran for migraine relief due to nausea and vomiting.  Vision Correction Strategy Considering changes to vision correction due to headaches and difficulty seeing at a distance. Opted to return to separate glasses for distance and reading. - Continue with separate glasses for distance and reading to  reduce eye strain and headaches.       There are no discontinued medications.  Return if symptoms worsen or fail to improve.    Subjective:   Encounter date: 12/28/2023  ANELI ZARA is a 58 y.o. female who has Chronic migraine without aura; Irritable bowel syndrome; Stricture of sigmoid colon (HCC); Diverticulitis; MCI (mild cognitive impairment); Insomnia; Anxiety; Hematochezia; Chronic midline low back pain with left-sided sciatica; Tick bite of right front wall of thorax; Gastroparesis, Idiopathic; Class 1 obesity in adult; Acute hip pain, left; Other fatigue; SOBOE (shortness of breath on exertion); Anxiety and depression; Pre-diabetes; Lumbar radiculopathy; Depression screen; BMI 33.0-33.9,adult; Generalized Obesity with starting BMI 33.6; Mixed hyperlipidemia; Insulin resistance; Vitamin D deficiency; Dizziness; Sensorineural hearing loss, bilateral; Left-sided tinnitus; Benign paroxysmal vertigo of right ear; and Blurry vision on their problem list..   She  has a past medical history of Allergy, Anxiety, Arthritis, Back pain, Constipation, Depression, Dizziness, Family history of malignant neoplasm of digestive organs (12/21/2021), Gastroparesis, GERD (gastroesophageal reflux disease), Heartburn, High cholesterol, History of colonic diverticulitis - recurrent with stricture (12/21/2021), IBS (irritable bowel syndrome), Lactose intolerance, Lumbar radiculopathy (08/12/2022), Migraine, Osteoarthritis, PONV (postoperative nausea and vomiting), Pre-diabetes, Stomach ulcer, and Weight decrease..   She presents with chief complaint of Medical Management of Chronic Issues (Discuss options for Lunesta. Not sleeping at all. Tried 3 mg and it didn't help. ) .    History of Present Illness   Nissa Stannard Mavis is a 58 year old female who presents with sleep issues. She is accompanied by her husband. She was referred by Dr. Catalina Lunger, a neurologist and sleep doctor, for evaluation of her insomnia.  She  experiences persistent insomnia characterized by difficulty staying asleep, waking up between 2 and 4 AM, and again between 5 and 6 AM, despite feeling tired. She has tried various medications  in the past, including Ambien, Belsomra, Lunesta, and Restoril, but did not find them effective in maintaining sleep. Recently, she tried Lunesta at doses of 1 mg, 2 mg, and 3 mg, with limited success, as she only managed to sleep for about four hours at the 2 mg dose. She has not taken any sleep medication for the past three nights.  She reports a recent episode of back pain that occurred after bending over, which she describes as not radiating down her leg. She has a history of back surgery, specifically a mini micro discectomy, and has experienced similar flare-ups in the past. She is cautious about taking medications due to stomach ulcers and has tried Valium without relief. She is hesitant to pick up items due to fear of exacerbating her back pain.  Additionally, she experiences migraines and takes Maxalt and Zofran for relief. She avoids taking sleep medications on nights when she has a migraine due to nausea and vomiting. She recently had an eye exam and is considering changing her glasses prescription as she suspects her current setup may contribute to her headaches.  No chest pain or shortness of breath.        HPI 02/01/2023 BOBBETTE EAKES is a 59 year old female who presents with difficulty sleeping after discontinuing [name].  She is experiencing significant difficulty sleeping after discontinuing [name], which she had been using for eight to nine years. Since stopping [name], she struggles to fall asleep and stay asleep, often waking up around 3 AM and having trouble returning to sleep. She estimates getting only four to five hours of sleep per night, which is impacting her daily functioning and causing headaches.  She has a history of trying multiple medications for sleep, including  amitriptyline, Restoril, Lunesta, Ambien, doxepin, trazodone, Belsomra, and Ramelteon, none of which have been effective or well-tolerated. Ambien caused significant next-day sedation, and gabapentin made her dizzy. She has not tolerated quetiapine (Seroquel) in the past and has a bottle from 15 years ago that she did not finish. Benzodiazepines, such as diazepam, do not help her sleep and are used infrequently for anxiety, about one pill in the last three months.  She experiences worsening migraines, which she attributes to lack of sleep and changes in barometric pressure. She cannot take her usual migraine medications as they now cause nausea and vomiting. She has previously used Botox injections for headache prevention.  Her past medical history includes back surgery, after which she was prescribed trazodone and gabapentin for sleep and pain management, respectively. She reports a 'funny reaction' to medications, noting that common sedatives like Benadryl do not make her sleepy.  She has undergone sleep studies in the past, which were negative for sleep apnea. She has also been evaluated for mild cognitive impairment, with recent tests and blood work indicating no markers for Alzheimer's disease. She is actively managing three businesses and reports no cognitive difficulties in her work.   ROS  Past Surgical History:  Procedure Laterality Date   ABDOMINAL HYSTERECTOMY     abdominalplasty     s/p weight loss   BLOOD PATCH  2019   brachiplasty     s/p weight loss    CHOLECYSTECTOMY  09/04/2012   Procedure: LAPAROSCOPIC CHOLECYSTECTOMY;  Surgeon: Shelly Rubenstein, MD;  Location: [facility] OR;  Service: General;  Laterality: N/A;   COLONOSCOPY  08/21/12   few scattered sigmoid diverticula: one sessile right colon polyp removed by cold biopsies x2:  patchy erythema in the left colon  multiple random biopsies done:  normal therminal ileum   COSMETIC SURGERY     KNEE ARTHROSCOPY W/ ACL RECONSTRUCTION      in early 1990's   LUMBAR PUNCTURE  2019   TONSILLECTOMY      Outpatient Medications Prior to Visit  Medication Sig Dispense Refill   buPROPion (WELLBUTRIN SR) 200 MG 12 hr tablet      dexlansoprazole (DEXILANT) 60 MG capsule Take 60 mg by mouth daily.     diazepam (VALIUM) 10 MG tablet Take 10 mg by mouth every 6 (six) hours as needed for anxiety.     donepezil (ARICEPT) 5 MG tablet Take 5 mg by mouth daily.     eszopiclone (LUNESTA) 1 MG TABS tablet Take 1-2 tablets (1-2 mg total) by mouth at bedtime as needed for sleep. Take immediately before bedtime 60 tablet 0   ondansetron (ZOFRAN) 8 MG tablet      tiZANidine (ZANAFLEX) 4 MG capsule Take 1 capsule (4 mg total) by mouth 3 (three) times daily as needed for muscle spasms.     No facility-administered medications prior to visit.    Family History  Problem Relation Age of Onset   Hypertension Mother    Hyperlipidemia Mother    Diabetes Mother    Thyroid disease Mother    Kidney disease Mother    Depression Mother    Anxiety disorder Mother    Anxiety disorder Father    Stroke Father    Hyperlipidemia Father    Cancer Father    Diabetes Father    Hypertension Father    Thyroid disease Sister    Diabetes Brother    Diabetes Maternal Grandmother    Cancer Maternal Grandfather     Social History   Socioeconomic History   Marital status: Married    Spouse name: Not on file   Number of children: 2   Years of education: 14   Highest education level: Some college, no degree  Occupational History   Not on file  Tobacco Use   Smoking status: Never   Smokeless tobacco: Never  Vaping Use   Vaping status: Never Used  Substance and Sexual Activity   Alcohol use: No   Drug use: No   Sexual activity: Not Currently  Other Topics Concern   Not on file  Social History Narrative   Lives at home with husband and children   Social Drivers of Health   Financial Resource Strain: Low Risk  (07/08/2020)   [facility]  [facility] visits prior to 12/11/2022., [facility] visits prior to 12/11/2022.   Overall Financial Resource Strain (CARDIA)    Difficulty of Paying Living Expenses: Not hard at all  Food Insecurity: No Food Insecurity (07/14/2021)   Received from [facility] visits prior to 12/11/2022., [facility] visits prior to 12/11/2022.   Hunger Vital Sign    Worried About Running Out of Food in the Last Year: Never true    Ran Out of Food in the Last Year: Never true  Transportation Needs: No Transportation Needs (07/14/2021)   Received from [facility] visits prior to 12/11/2022., [facility] visits prior to 12/11/2022.   PRAPARE - Administrator, Civil Service (Medical): No    Lack of Transportation (Non-Medical): No  Physical Activity: Insufficiently Active (07/14/2021)   Received from [facility] visits prior to 12/11/2022., [facility] visits prior to 12/11/2022.   Exercise Vital Sign    Days of Exercise per Week: 4 days    Minutes of Exercise per Session: 20 min  Stress: No Stress Concern Present (07/14/2021)   [facility] prior to 12/11/2022., [facility] visits prior to 12/11/2022.   [employer] of Occupational Health - Occupational Stress Questionnaire    Feeling of Stress : Only a little  Social Connections: Unknown (02/22/2022)   Received from [facility]   Social Network    Social Network: Not on file  Intimate Partner Violence: Unknown (01/14/2022)   Received from [facility]   HITS    Physically Hurt: Not on file    Insult or Talk Down To: Not on file    Threaten Physical Harm: Not on file    Scream or Curse: Not on file                                                                                                   Objective:  Physical Exam: BP 126/82   Wt 199 lb 9.6 oz (90.5 kg)   BMI 34.26 kg/m     Physical Exam Constitutional:      General: She is not in acute distress.    Appearance: Normal appearance. She is not ill-appearing or toxic-appearing.  HENT:     Head: Normocephalic and atraumatic.     Nose: Nose normal. No congestion.  Eyes:     General: No scleral icterus.    Extraocular Movements: Extraocular movements intact.  Cardiovascular:     Rate and Rhythm: Normal rate and regular rhythm.     Pulses: Normal pulses.     Heart sounds: Normal heart sounds.  Pulmonary:     Effort: Pulmonary effort is normal. No respiratory distress.     Breath sounds: Normal breath sounds.  Abdominal:     General: Abdomen is flat. Bowel sounds are normal.     Palpations: Abdomen is soft.  Musculoskeletal:        General: Normal range of motion.  Lymphadenopathy:     Cervical: No cervical adenopathy.  Skin:    General: Skin is warm and dry.     Findings: No rash.  Neurological:     General: No focal deficit present.     Mental Status: She is alert and oriented to person, place, and time. Mental status is at baseline.  Psychiatric:        Mood and Affect: Mood normal.        Behavior: Behavior normal.     No results found.  No results found for this or any previous visit (from the past 2160 hours).      Garner Nash, MD, MS

## 2024-01-10 ENCOUNTER — Telehealth: Payer: Self-pay

## 2024-01-10 DIAGNOSIS — F5101 Primary insomnia: Secondary | ICD-10-CM

## 2024-01-10 MED ORDER — QUVIVIQ 25 MG PO TABS: 1.0000 | ORAL_TABLET | Freq: Every evening | ORAL | 0 refills | Status: AC | PRN

## 2024-01-10 MED ORDER — QUVIVIQ 25 MG PO TABS: 1.0000 | ORAL_TABLET | Freq: Every evening | ORAL | 0 refills | Status: DC | PRN

## 2024-01-10 NOTE — Telephone Encounter (Signed)
 Copied from CRM [id number]. Topic: Clinical - Medication Question >> Jan 10, 2024 10:33 AM [place] M wrote: Reason for CRM: patient is having reverse effects of zolpidem (AMBIEN CR) 6.25 MG CR tablet, wanting to know if she can try another medication that is new called Quviviq (daridorexant). Please reach out to patient as soon as possible.

## 2024-01-10 NOTE — Addendum Note (Signed)
 Addended by: Fanny Bien B on: 01/10/2024 04:56 PM   Modules accepted: Orders

## 2024-01-10 NOTE — Telephone Encounter (Signed)
 Patient states that [place] and sonata isn't helping her get sleep. She states that she gets 2-3 hours of sleep per night. Would like to try Quviviq as discussed in the OV. She states she is willing to pay out of pocket and would like it sent to the [facility] at [facility].

## 2024-01-10 NOTE — Addendum Note (Signed)
 Addended by: Fanny Bien B on: 01/10/2024 04:19 PM   Modules accepted: Orders

## 2024-01-16 ENCOUNTER — Telehealth: Payer: Self-pay

## 2024-01-16 ENCOUNTER — Other Ambulatory Visit (HOSPITAL_COMMUNITY): Payer: Self-pay

## 2024-01-16 NOTE — Telephone Encounter (Signed)
 Received fax request from pharmacy requesting PA for quviviq 25mg  tablet. Please advise.

## 2024-01-16 NOTE — Telephone Encounter (Signed)
 Pharmacy Patient Advocate Encounter   Received notification from Pt Calls Messages that prior authorization for Quviviq 25mg  is required/requested.   Insurance verification completed.   The patient is insured through [employer] .   Per test claim: PA required; PA submitted to above mentioned insurance via CoverMyMeds Key/confirmation #/EOC [id number] Status is pending

## 2024-01-17 ENCOUNTER — Other Ambulatory Visit (HOSPITAL_COMMUNITY): Payer: Self-pay

## 2024-01-17 NOTE — Telephone Encounter (Signed)
 Forwarding message below

## 2024-01-17 NOTE — Telephone Encounter (Signed)
 Copied from CRM [id number]. Topic: Clinical - Prescription Issue >> Jan 17, 2024 11:18 AM [place] E wrote: Reason for CRM: Latara from [facility] stated that prior authorization for patient's Daridorexant HCl (QUVIVIQ) 25 MG TABS has been denied. Denial reason being that patient would need to try Ramelteon first, or if there was any specific reason as to why she has not tried that medication. Callback number for [employer] is [phone], press option 3, and then option 1.

## 2024-01-17 NOTE — Telephone Encounter (Signed)
 Patient walked into clinic today and stated that she spoke with insurance regarding Prior auth. They stated that She can try rozerem medication, but she can't take rozerem with aricept. Patient stated that insurance stated that if they receive a note that patient has tried list of previous medications and cannot take rozerem due to taking aricept they can approve the quviviq. Please advise

## 2024-01-18 ENCOUNTER — Telehealth: Payer: Self-pay | Admitting: Pharmacist

## 2024-01-18 NOTE — Telephone Encounter (Signed)
 An E-Appeal has been submitted for Quviviq. Will advise when response is received, please be advised that most companies may take 30 days to make a decision. Appeal letter and supporting documentation have been uploaded and submitted via the [facility] Website on 01/18/2024 @7 :53 am.    Thank you, Dellie Burns, PharmD Clinical Pharmacist  [place]  Direct Dial: [phone]

## 2024-01-20 NOTE — Telephone Encounter (Signed)
 Checked CMM and the case had cancelled.   Called the insurance and per the rep, there was a provider courtesy review in progress started on April 9th with a turnaround time of 7 days.   Case ID: [id number] Phone# [phone]

## 2024-01-24 NOTE — Telephone Encounter (Signed)
 Received a fax from [facility] stating provider courtesy review has been approved for quviviq 25 mg tablet 01/16/2024-01/15/2025. Notifying patient via mychart.

## 2024-02-02 DIAGNOSIS — I10 Essential (primary) hypertension: Secondary | ICD-10-CM | POA: Diagnosis not present

## 2024-02-02 DIAGNOSIS — Z79899 Other long term (current) drug therapy: Secondary | ICD-10-CM | POA: Diagnosis not present

## 2024-02-02 DIAGNOSIS — R253 Fasciculation: Secondary | ICD-10-CM | POA: Diagnosis not present

## 2024-02-02 DIAGNOSIS — Z8669 Personal history of other diseases of the nervous system and sense organs: Secondary | ICD-10-CM | POA: Diagnosis not present

## 2024-02-02 DIAGNOSIS — F439 Reaction to severe stress, unspecified: Secondary | ICD-10-CM | POA: Diagnosis not present

## 2024-02-20 ENCOUNTER — Telehealth: Payer: Self-pay

## 2024-02-20 NOTE — Telephone Encounter (Signed)
 Copied from CRM [id number]. Topic: Clinical - Medication Question >> Feb 20, 2024 10:43 AM Albertha Alosa wrote: Reason for CRM: Patient called in regarding Daridorexant  HCl (QUVIVIQ ) 25 MG TABS , stated she does feel like it is working but wanted to know if she would able to get alil higher dosage . If so would like it sent to  [facility] [address] - [place], [place] - [address] [place] [place]. [place] [address] Phone: [phone] Fax: [phone]

## 2024-02-20 NOTE — Telephone Encounter (Signed)
 Forwarding message below. LOV 12/28/2023 FOV none scheduled LRF- unknown

## 2024-02-21 ENCOUNTER — Encounter: Payer: Self-pay | Admitting: Family Medicine

## 2024-02-21 DIAGNOSIS — F5101 Primary insomnia: Secondary | ICD-10-CM

## 2024-02-22 DIAGNOSIS — F410 Panic disorder [episodic paroxysmal anxiety] without agoraphobia: Secondary | ICD-10-CM | POA: Diagnosis not present

## 2024-02-22 DIAGNOSIS — F3341 Major depressive disorder, recurrent, in partial remission: Secondary | ICD-10-CM | POA: Diagnosis not present

## 2024-02-22 DIAGNOSIS — F900 Attention-deficit hyperactivity disorder, predominantly inattentive type: Secondary | ICD-10-CM | POA: Diagnosis not present

## 2024-02-22 MED ORDER — QUVIVIQ 50 MG PO TABS: 50.0000 mg | ORAL_TABLET | Freq: Every evening | ORAL | 0 refills | Status: DC | PRN

## 2024-02-22 NOTE — Telephone Encounter (Signed)
    Chief Complaint:  [name] is a 58 y.o. female who presents for a MyChart e-visit.  Assessment/Plan:  Insomnia Uncontrolled with daridorexant  25 mg nightly Increase to daridorexant  50 mg nightly as needed for insomnia Follow up in 1 month to assess response    Subjective:  HPI:  Requesting increase to higher dose of daridorexant  because 25 mg nightly as needed was not helping her insomnia.       Objective:   No results found for this or any previous visit (from the past 72 hours).   MyChart e-Visit   Contact was initiated by the patient on 02/20/2022 via MyChart. The limitations of evaluation and management by telemedicine and the availability of in person appointments were discussed. The patient expressed understanding and agreed to proceed.     A total of 12 minutes were spent on medical discussion for the 7 day period starting on 02/22/2024. There is no separately reported E/M service related to this service in the 7 days preceding date of initial contact nor does the patient have an upcoming soonest available appointment for this issue.      Harle Libra, MD, MS  02/22/2024 1:36 PM

## 2024-03-19 ENCOUNTER — Other Ambulatory Visit: Payer: Self-pay | Admitting: Family Medicine

## 2024-03-19 ENCOUNTER — Encounter: Payer: Self-pay | Admitting: Family Medicine

## 2024-03-19 DIAGNOSIS — F5101 Primary insomnia: Secondary | ICD-10-CM

## 2024-03-22 DIAGNOSIS — D1771 Benign lipomatous neoplasm of kidney: Secondary | ICD-10-CM | POA: Diagnosis not present

## 2024-03-22 NOTE — Telephone Encounter (Signed)
 Pt called, informed pt that PCP did send in refill for 30 tabs for Quviviq  50 MG. Pt states she spoke with a young girl yesterday morning and was very rude to her and stated PCP would not be filling so pt had reached out to psych to see if they could prescribe until appt with PCP. I advised her to check with [facility] and make sure rx was received but showed confirmation on my end that was received. No further assistance noted.   Copied from CRM [id number]. Topic: Clinical - Prescription Issue >> Mar 22, 2024 10:30 AM [name] wrote: Reason for CRM: Patient stated that she is running out of her sleep medication on Saturday and she is unable to get a prescription without seeing Dr. Hildy Lowers. His next available will be in July and she will be out of the medication before then. Patient stated her psychiatrist [name] (Fax: [phone]) is willing to prescribe the medication if she can get a fax from Dr. Hildy Lowers stating that he gives permission and he will not be prescribing until patient returns to him.   Medication: QUVIVIQ  50 MG TABS [[id number]]

## 2024-04-09 ENCOUNTER — Encounter: Payer: Self-pay | Admitting: Family Medicine

## 2024-04-10 ENCOUNTER — Ambulatory Visit: Admitting: Family Medicine

## 2024-04-10 DIAGNOSIS — Z0279 Encounter for issue of other medical certificate: Secondary | ICD-10-CM

## 2024-04-16 DIAGNOSIS — D485 Neoplasm of uncertain behavior of skin: Secondary | ICD-10-CM | POA: Diagnosis not present

## 2024-04-16 DIAGNOSIS — D1801 Hemangioma of skin and subcutaneous tissue: Secondary | ICD-10-CM | POA: Diagnosis not present

## 2024-04-16 DIAGNOSIS — B078 Other viral warts: Secondary | ICD-10-CM | POA: Diagnosis not present

## 2024-04-16 DIAGNOSIS — L821 Other seborrheic keratosis: Secondary | ICD-10-CM | POA: Diagnosis not present

## 2024-04-16 DIAGNOSIS — L859 Epidermal thickening, unspecified: Secondary | ICD-10-CM | POA: Diagnosis not present

## 2024-04-16 DIAGNOSIS — D2362 Other benign neoplasm of skin of left upper limb, including shoulder: Secondary | ICD-10-CM | POA: Diagnosis not present

## 2024-04-16 DIAGNOSIS — Z1283 Encounter for screening for malignant neoplasm of skin: Secondary | ICD-10-CM | POA: Diagnosis not present

## 2024-04-25 DIAGNOSIS — M545 Low back pain, unspecified: Secondary | ICD-10-CM | POA: Diagnosis not present

## 2024-04-25 DIAGNOSIS — M25512 Pain in left shoulder: Secondary | ICD-10-CM | POA: Diagnosis not present

## 2024-04-25 DIAGNOSIS — R1084 Generalized abdominal pain: Secondary | ICD-10-CM | POA: Diagnosis not present

## 2024-05-12 ENCOUNTER — Emergency Department (HOSPITAL_BASED_OUTPATIENT_CLINIC_OR_DEPARTMENT_OTHER)

## 2024-05-12 ENCOUNTER — Encounter (HOSPITAL_BASED_OUTPATIENT_CLINIC_OR_DEPARTMENT_OTHER): Payer: Self-pay | Admitting: Emergency Medicine

## 2024-05-12 ENCOUNTER — Emergency Department (HOSPITAL_BASED_OUTPATIENT_CLINIC_OR_DEPARTMENT_OTHER): Admission: EM | Admit: 2024-05-12 | Discharge: 2024-05-13 | Disposition: A | Discharge status: Home / Self Care

## 2024-05-12 ENCOUNTER — Other Ambulatory Visit: Payer: Self-pay

## 2024-05-12 DIAGNOSIS — S0083XA Contusion of other part of head, initial encounter: Secondary | ICD-10-CM | POA: Insufficient documentation

## 2024-05-12 DIAGNOSIS — S40011A Contusion of right shoulder, initial encounter: Secondary | ICD-10-CM | POA: Insufficient documentation

## 2024-05-12 DIAGNOSIS — W19XXXA Unspecified fall, initial encounter: Secondary | ICD-10-CM

## 2024-05-12 DIAGNOSIS — S0993XA Unspecified injury of face, initial encounter: Secondary | ICD-10-CM | POA: Diagnosis not present

## 2024-05-12 DIAGNOSIS — W01198A Fall on same level from slipping, tripping and stumbling with subsequent striking against other object, initial encounter: Secondary | ICD-10-CM | POA: Diagnosis not present

## 2024-05-12 DIAGNOSIS — Y9301 Activity, walking, marching and hiking: Secondary | ICD-10-CM | POA: Diagnosis not present

## 2024-05-12 DIAGNOSIS — S80211A Abrasion, right knee, initial encounter: Secondary | ICD-10-CM | POA: Diagnosis not present

## 2024-05-12 DIAGNOSIS — M25532 Pain in left wrist: Secondary | ICD-10-CM | POA: Diagnosis not present

## 2024-05-12 DIAGNOSIS — M25511 Pain in right shoulder: Secondary | ICD-10-CM | POA: Diagnosis not present

## 2024-05-12 DIAGNOSIS — Y92009 Unspecified place in unspecified non-institutional (private) residence as the place of occurrence of the external cause: Secondary | ICD-10-CM | POA: Diagnosis not present

## 2024-05-12 MED ORDER — KETOROLAC TROMETHAMINE 15 MG/ML IJ SOLN
15.0000 mg | Freq: Once | INTRAMUSCULAR | Status: AC
  Administered 2024-05-12: 15 mg via INTRAMUSCULAR
  Filled 2024-05-12: qty 1

## 2024-05-12 MED ORDER — HYDROMORPHONE HCL 1 MG/ML IJ SOLN
2.0000 mg | Freq: Once | INTRAMUSCULAR | Status: AC
  Administered 2024-05-13: 2 mg via INTRAMUSCULAR
  Filled 2024-05-12: qty 2

## 2024-05-12 MED ORDER — TRAMADOL HCL 50 MG PO TABS: 50.0000 mg | ORAL_TABLET | Freq: Four times a day (QID) | ORAL | 0 refills | Status: AC | PRN

## 2024-05-12 NOTE — ED Provider Notes (Signed)
 [place] [facility] Provider Note   CSN: [id number] Arrival date & time: 05/12/24  2212     Patient presents with: [name] is a 58 y.o. female patient who presents to the emergency department today for mechanical trip and fall which occurred prior to arrival.  Patient states that she was walking when her sandals caught causing her to fall straight forward hitting her chin on the ground.  She also hurt her left wrist and right shoulder where she is complaining of pain.  She did not hit any part of her head nor did she lose consciousness.  Patient is not anticoagulated.   Fall       Prior to Admission medications   Medication Sig Start Date End Date Taking? Authorizing Provider  buPROPion (WELLBUTRIN SR) 200 MG 12 hr tablet     [provider]  dexlansoprazole (DEXILANT) 60 MG capsule Take 60 mg by mouth daily.    [provider]  diazepam (VALIUM) 10 MG tablet Take 10 mg by mouth every 6 (six) hours as needed for anxiety.    [provider]  donepezil (ARICEPT) 5 MG tablet Take 5 mg by mouth daily. 02/01/20   [provider]  ondansetron  (ZOFRAN ) 8 MG tablet  10/26/15   [provider]  QUVIVIQ  50 MG TABS TAKE ONE TABLET BY MOUTH AT BEDTIME AS NEEDED (INSOMNIA).TAKE WITHIN 30 MINUTES OF GOING TO BED WITH AT LEAST SEVEN HOURS REMAINING PRIOR TO AWAKENING. TIME TO SLEEP ONSET MAY BE DELAYED IF TAKEN TO SOON AFTER A MEAL 03/21/24   Sebastian Beverley NOVAK, MD    Allergies: Codeine, Lasmiditan, Erythromycin, Hydrocodone -acetaminophen , Penicillins, Ambien  [zolpidem ], Gabapentin, Methocarbamol, Gadavist  [gadobutrol ], and Ondansetron     Review of Systems  All other systems reviewed and are negative.   Updated Vital Signs Ht 5' 5 (1.651 m)   Wt 81.6 kg   BMI 29.95 kg/m   Physical Exam Vitals and nursing note reviewed.  Constitutional:      Appearance: Normal appearance.  HENT:     Head:  Normocephalic.   Eyes:     General:        Right eye: No discharge.        Left eye: No discharge.     Conjunctiva/sclera: Conjunctivae normal.  Pulmonary:     Effort: Pulmonary effort is normal.  Musculoskeletal:     Comments: Diffuse shoulder tenderness to palpation.  Limited range of motion secondary to pain in the right shoulder.  2+ radial pulse felt in the right and equal in comparison to the left. Superficial abrasion to the right knee.  Skin:    General: Skin is warm and dry.     Findings: No rash.  Neurological:     General: No focal deficit present.     Mental Status: She is alert.  Psychiatric:        Mood and Affect: Mood normal.        Behavior: Behavior normal.     (all labs ordered are listed, but only abnormal results are displayed) Labs Reviewed - No data to display  EKG: None  Radiology: No results found.   Procedures   Medications Ordered in the ED  ketorolac  (TORADOL ) 15 MG/ML injection 15 mg (has no administration in time range)     Medical Decision Making [name] is a 58 y.o. female patient who presents to the emergency department today for further evaluation of a mechanical fall.  Will get imaging over the right shoulder and left wrist.  Will also get a CT maxillofacial to look for any mandibular fracture.  Patient took 2 Valium prior to coming here.  Will hold off on narcotic pain medication right now.  Will give her Toradol  to start.  CT maxillofacial was normal.  Imaging of the right shoulder and left wrist are still pending.  Due to shift change, the rest of his care was transferred to oncoming provider with ultimate disposition be made.  I do feel the patient would likely be able to be discharged.  Again scans are pending and ultimate disposition still pending.  Amount and/or Complexity of Data Reviewed Radiology: ordered.  Risk Prescription drug management.     Final diagnoses:  None    ED Discharge Orders     None           Theotis Cameron HERO, [place] 05/12/24 2337    Geroldine Berg, MD 05/12/24 2351

## 2024-05-12 NOTE — Discharge Instructions (Addendum)
 Take ibuprofen  600 mg every 6 hours as needed for pain.  Take tramadol  as prescribed as needed for pain not relieved with ibuprofen .  Ice affected areas for 20 minutes every 2 hours while awake for the next 2 days.  Wear sling for comfort and support.  Follow-up with your primary doctor if not improving in the next week.

## 2024-05-12 NOTE — ED Triage Notes (Signed)
 Pt reports mechanical fall at home ~ 20 min ago. Pt reports pain to R knee (abrasion), chin, R shoulder and L wrist. Pt talking without opening jaw. Pt states she hit her L wrist on a chair on the way down, but has full ROM. She reports hitting her chin on the floor, and not sure what happened to R shoulder but limited ROM and pain in the area. Pt maintains this was a mechanical fall but also mentions feeling dizzy twice today.

## 2024-05-14 DIAGNOSIS — M25512 Pain in left shoulder: Secondary | ICD-10-CM | POA: Diagnosis not present

## 2024-05-22 DIAGNOSIS — M25511 Pain in right shoulder: Secondary | ICD-10-CM | POA: Diagnosis not present

## 2024-05-30 DIAGNOSIS — S42254A Nondisplaced fracture of greater tuberosity of right humerus, initial encounter for closed fracture: Secondary | ICD-10-CM | POA: Diagnosis not present

## 2024-06-20 DIAGNOSIS — S42254A Nondisplaced fracture of greater tuberosity of right humerus, initial encounter for closed fracture: Secondary | ICD-10-CM | POA: Diagnosis not present

## 2024-06-27 ENCOUNTER — Ambulatory Visit: Attending: Orthopedic Surgery | Admitting: Physical Therapy

## 2024-06-27 ENCOUNTER — Other Ambulatory Visit: Payer: Self-pay

## 2024-06-27 ENCOUNTER — Encounter: Payer: Self-pay | Admitting: Physical Therapy

## 2024-06-27 DIAGNOSIS — M25611 Stiffness of right shoulder, not elsewhere classified: Secondary | ICD-10-CM | POA: Insufficient documentation

## 2024-06-27 DIAGNOSIS — M25511 Pain in right shoulder: Secondary | ICD-10-CM | POA: Insufficient documentation

## 2024-06-27 NOTE — Therapy (Addendum)
 OUTPATIENT PHYSICAL THERAPY SHOULDER EVALUATION   Patient Name: [name] MRN: [id number] DOB:09/07/1966, 58 y.o., female Today's Date: 06/27/2024  END OF SESSION:  PT End of Session - 06/27/24 1615     Visit Number 1    Date for PT Re-Evaluation 09/26/24    Authorization Type [employer]    PT Start Time 1612    PT Stop Time 1655    PT Time Calculation (min) 43 min    Activity Tolerance Patient tolerated treatment well    Behavior During Therapy [facility] for tasks assessed/performed          Past Medical History:  Diagnosis Date   Allergy    Anxiety    Arthritis    Back pain    Constipation    Depression    Dizziness    Family history of malignant neoplasm of digestive organs 12/21/2021   Gastroparesis    [facility]   GERD (gastroesophageal reflux disease)    Heartburn    High cholesterol    History of colonic diverticulitis - recurrent with stricture 12/21/2021   IBS (irritable bowel syndrome)    Lactose intolerance    Lumbar radiculopathy 08/12/2022   Alm Molt, MD  [facility] [place])   Migraine    [facility]   Osteoarthritis    PONV (postoperative nausea and vomiting)    Pre-diabetes    Stomach ulcer    Weight decrease    Past Surgical History:  Procedure Laterality Date   ABDOMINAL HYSTERECTOMY     abdominalplasty     s/p weight loss   BACK SURGERY     BLOOD PATCH  2019   brachiplasty     s/p weight loss    CHOLECYSTECTOMY  09/04/2012   Procedure: LAPAROSCOPIC CHOLECYSTECTOMY;  Surgeon: Vicenta DELENA Poli, MD;  Location: [facility] OR;  Service: General;  Laterality: N/A;   COLONOSCOPY  08/21/2012   few scattered sigmoid diverticula: one sessile right colon polyp removed by cold biopsies x2:  patchy erythema in the left colon multiple random biopsies done:  normal therminal ileum   COSMETIC SURGERY     KNEE ARTHROSCOPY W/ ACL RECONSTRUCTION     in early 1990's   LUMBAR PUNCTURE  2019   TONSILLECTOMY     Patient Active Problem  List   Diagnosis Date Noted   Blurry vision 12/28/2023   Dizziness 09/19/2023   Sensorineural hearing loss, bilateral 09/19/2023   Left-sided tinnitus 09/19/2023   Benign paroxysmal vertigo of right ear 09/19/2023   Mixed hyperlipidemia 05/04/2023   Insulin  resistance 05/04/2023   Vitamin D  deficiency 05/04/2023   Other fatigue 04/19/2023   SOBOE (shortness of breath on exertion) 04/19/2023   Anxiety and depression 04/19/2023   Pre-diabetes 04/19/2023   Lumbar radiculopathy 04/19/2023   Depression screen 04/19/2023   BMI 33.0-33.9,adult 04/19/2023   Generalized Obesity with starting BMI 33.6 04/19/2023   Tick bite of right front wall of thorax 02/01/2023   Gastroparesis, Idiopathic 02/01/2023   Class 1 obesity in adult 02/01/2023   Acute hip pain, left 02/01/2023   Chronic midline low back pain with left-sided sciatica 04/22/2022   Diverticulitis 01/21/2022   MCI (mild cognitive impairment) 01/21/2022   Insomnia 01/21/2022   Anxiety 01/21/2022   Hematochezia 01/21/2022   Stricture of sigmoid colon (HCC) 12/21/2021   Irritable bowel syndrome 12/31/2020   Chronic migraine without aura 03/29/2014    PCP: A. Sebastian, MD  REFERRING PROVIDER: Sherida, MD  REFERRING DIAG: right greater tuberosity  fracture humerus  THERAPY DIAG:  Acute pain of right shoulder  Stiffness of right shoulder, not elsewhere classified  Rationale for Evaluation and Treatment: Rehabilitation  ONSET DATE: May 12, 2024  SUBJECTIVE:                                                                                                                                                                                      SUBJECTIVE STATEMENT: Patient had a fall on August 2, she hit the knee and her chin, she reports never had a bruise or anything.  Reports that has had significant pain and loss of ROM of the right shoulder.  Immediate x-rays were negative.  Continued to have significant pain, she had further  testing showed a greater tuberosity fracture, also some fraying of the RC mms.  MD order is for ROM and gentle progression Hand dominance: Right  PERTINENT HISTORY: As above  PAIN:  Are you having pain? Yes: NPRS scale: 0/10 Pain location: right anterior shoulder and superior shoulder Pain description: burns, ache Aggravating factors: any motions, sitting up, doing hair, getting dressed all will increase pain to 10/10 Relieving factors: not moving, flexeril pain can be 0/10  PRECAUTIONS: None  RED FLAGS: None   WEIGHT BEARING RESTRICTIONS: No  FALLS:  Has patient fallen in last 6 months? Yes. 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  DIAGNOSTIC FINDINGS:  Found greater tuberosity fracture right humerus  COGNITION: Overall cognitive status: Within functional limits for tasks assessed     SENSATION: WFL  POSTURE: Fwd head, and rounded shoulders  UPPER EXTREMITY ROM: all motions caused   Active ROM Right AROM eval RIGHT PROM eval  Shoulder flexion 65 82  Shoulder extension    Shoulder abduction 30 45  Shoulder adduction    Shoulder internal rotation 15 20  Shoulder external rotation 20 45  Elbow flexion    Elbow extension    Wrist flexion    Wrist extension    Wrist ulnar deviation    Wrist radial deviation    Wrist pronation    Wrist supination    (Blank rows = not tested)  UPPER EXTREMITY MMT:  not tested due to pain  MMT Right eval Left eval  Shoulder  flexion    Shoulder extension    Shoulder abduction     Shoulder adduction    Shoulder internal rotation    Shoulder external rotation    Middle trapezius    Lower trapezius    Elbow flexion    Elbow extension    Wrist flexion    Wrist extension    Wrist ulnar deviation    Wrist radial deviation    Wrist pronation    Wrist supination    Grip strength (lbs)    (Blank rows = not tested)  SHOULDER SPECIAL TESTS: None tested due to pain  JOINT MOBILITY TESTING:  N/T  PALPATION:  Very tight and tender in the upper trap, neck, rhomboid and right upper arm mms QUICK DASH = 86.4%                                                                                                                            TREATMENT DATE:  06/27/24 Evaluation HEP   PATIENT EDUCATION: Education details: POC/HEP Person educated: Patient Education method: Programmer, multimedia, Facilities manager, Actor cues, Verbal cues, and Handouts Education comprehension: verbalized understanding  HOME EXERCISE PROGRAM: Access Code: [email or web address] URL: [email or web address] Date: 06/27/2024 Prepared by: Ozell Mainland  Exercises - Seated Shoulder Shrugs  - 2 x daily - 7 x weekly - 1 sets - 10 reps - 3 hold - Seated Scapular Retraction  - 2 x daily - 7 x weekly - 1 sets - 10 reps - 3 hold - Seated Shoulder Flexion Towel Slide at Table Top  - 2 x daily - 7 x weekly - 1 sets - 10 reps - 5 hold - Seated Shoulder External Rotation PROM on Table  - 2 x daily - 7 x weekly - 1 sets - 10 reps - 5 hold - Shoulder Flexion Wall Slide with Towel  - 2 x daily - 7 x weekly - 1 sets - 10 reps - 3 hold - Shoulder Circles on Wall with Towel  - 2 x daily - 7 x weekly - 1 sets - 10 reps - 3 hold  ASSESSMENT:  CLINICAL IMPRESSION: Patient is a 58 y.o. female who was seen today for physical therapy evaluation and treatment for right shoulder greater tubercle fracture, she is and has been in a lot of pain, reports that she is right handed and has been unable to put on mascara, do hair  and has trouble dressing due to pain and very limited ROM.   OBJECTIVE IMPAIRMENTS: decreased activity tolerance, decreased endurance, decreased ROM, decreased strength, increased fascial restrictions, increased muscle spasms, impaired flexibility, impaired UE functional use, improper body mechanics, postural dysfunction, and pain.   REHAB POTENTIAL: Good  CLINICAL DECISION MAKING: Evolving/moderate complexity  EVALUATION COMPLEXITY: Moderate   GOALS: Goals reviewed with patient? Yes  SHORT TERM GOALS: Target date: 07/16/24  Independent with initial HEP Baseline: Goal status: INITIAL  LONG TERM GOALS: Target date: 09/26/24  Independent with advanced HEP Baseline:  Goal  status: INITIAL  2.  Decrease pain 50% Baseline:  Goal status: INITIAL  3.  Increase AROM of the right shoulder to 150 degrees Baseline:  Goal status: INITIAL  4.  Increase AROM of the right shoulder abduction to 130 degrees Baseline:  Goal status: INITIAL  5.  Be able to do hair without difficulty Baseline:  Goal status: INITIAL  6.  Be able to dress wihtout difficulty Baseline:  Goal status: INITIAL  PLAN:  PT FREQUENCY: 2x/week  PT DURATION: 12 weeks  PLANNED INTERVENTIONS: 97164- PT Re-evaluation, 97110-Therapeutic exercises, 97530- Therapeutic activity, [id number]- Neuromuscular re-education, 97535- Self Care, [id number]- Manual therapy, G0283- Electrical stimulation (unattended), 97035- Ultrasound, Patient/Family education, Taping, Cryotherapy, and Moist heat  PLAN FOR NEXT SESSION: slowly get her moving, any AROM increases pain, has very limited ROM   Deslyn Cavenaugh W, PT 06/27/2024, 4:16 PM

## 2024-07-02 ENCOUNTER — Ambulatory Visit: Admitting: Physical Therapy

## 2024-07-02 ENCOUNTER — Encounter: Payer: Self-pay | Admitting: Physical Therapy

## 2024-07-02 DIAGNOSIS — M25511 Pain in right shoulder: Secondary | ICD-10-CM | POA: Diagnosis not present

## 2024-07-02 DIAGNOSIS — M25611 Stiffness of right shoulder, not elsewhere classified: Secondary | ICD-10-CM | POA: Diagnosis not present

## 2024-07-02 NOTE — Therapy (Signed)
 OUTPATIENT PHYSICAL THERAPY SHOULDER EVALUATION   Patient Name: [name] MRN: [id number] DOB:[birth year], 58 y.o., female Today's Date: 07/02/2024  END OF SESSION:  PT End of Session - 07/02/24 1343     Visit Number 2    Date for Recertification  09/26/24    PT Start Time 1345    PT Stop Time 1430    PT Time Calculation (min) 45 min    Activity Tolerance Patient tolerated treatment well    Behavior During Therapy [facility] for tasks assessed/performed          Past Medical History:  Diagnosis Date   Allergy    Anxiety    Arthritis    Back pain    Constipation    Depression    Dizziness    Family history of malignant neoplasm of digestive organs 12/21/2021   Gastroparesis    [facility]   GERD (gastroesophageal reflux disease)    Heartburn    High cholesterol    History of colonic diverticulitis - recurrent with stricture 12/21/2021   IBS (irritable bowel syndrome)    Lactose intolerance    Lumbar radiculopathy 08/12/2022   Alm Molt, MD  [facility] [place])   Migraine    [facility]   Osteoarthritis    PONV (postoperative nausea and vomiting)    Pre-diabetes    Stomach ulcer    Weight decrease    Past Surgical History:  Procedure Laterality Date   ABDOMINAL HYSTERECTOMY     abdominalplasty     s/p weight loss   BACK SURGERY     BLOOD PATCH  2019   brachiplasty     s/p weight loss    CHOLECYSTECTOMY  09/04/2012   Procedure: LAPAROSCOPIC CHOLECYSTECTOMY;  Surgeon: Vicenta DELENA Poli, MD;  Location: [facility] OR;  Service: General;  Laterality: N/A;   COLONOSCOPY  08/21/2012   few scattered sigmoid diverticula: one sessile right colon polyp removed by cold biopsies x2:  patchy erythema in the left colon multiple random biopsies done:  normal therminal ileum   COSMETIC SURGERY     KNEE ARTHROSCOPY W/ ACL RECONSTRUCTION     in early 1990's   LUMBAR PUNCTURE  2019   TONSILLECTOMY     Patient Active Problem List   Diagnosis Date Noted    Blurry vision 12/28/2023   Dizziness 09/19/2023   Sensorineural hearing loss, bilateral 09/19/2023   Left-sided tinnitus 09/19/2023   Benign paroxysmal vertigo of right ear 09/19/2023   Mixed hyperlipidemia 05/04/2023   Insulin  resistance 05/04/2023   Vitamin D  deficiency 05/04/2023   Other fatigue 04/19/2023   SOBOE (shortness of breath on exertion) 04/19/2023   Anxiety and depression 04/19/2023   Pre-diabetes 04/19/2023   Lumbar radiculopathy 04/19/2023   Depression screen 04/19/2023   BMI 33.0-33.9,adult 04/19/2023   Generalized Obesity with starting BMI 33.6 04/19/2023   Tick bite of right front wall of thorax 02/01/2023   Gastroparesis, Idiopathic 02/01/2023   Class 1 obesity in adult 02/01/2023   Acute hip pain, left 02/01/2023   Chronic midline low back pain with left-sided sciatica 04/22/2022   Diverticulitis 01/21/2022   MCI (mild cognitive impairment) 01/21/2022   Insomnia 01/21/2022   Anxiety 01/21/2022   Hematochezia 01/21/2022   Stricture of sigmoid colon (HCC) 12/21/2021   Irritable bowel syndrome 12/31/2020   Chronic migraine without aura 03/29/2014    PCP: A. Sebastian, MD  REFERRING PROVIDER: Sherida, MD  REFERRING DIAG: right greater tuberosity fracture humerus  THERAPY DIAG:  Acute pain of right shoulder  Stiffness of right shoulder, not elsewhere classified  Rationale for Evaluation and Treatment: Rehabilitation  ONSET DATE: May 12, 2024  SUBJECTIVE:                                                                                                                                                                                      SUBJECTIVE STATEMENT:  It hurts, feels like something is twisting in the form of her R arm. Pain also in the biceps area   Patient had a fall on August 2, she hit the knee and her chin, she reports never had a bruise or anything.  Reports that has had significant pain and loss of ROM of the right shoulder.  Immediate  x-rays were negative.  Continued to have significant pain, she had further testing showed a greater tuberosity fracture, also some fraying of the RC mms.  MD order is for ROM and gentle progression Hand dominance: Right  PERTINENT HISTORY: As above  PAIN:  Are you having pain? Yes: NPRS scale: 5/10 Pain location: right anterior shoulder and superior shoulder Pain description: burns, ache Aggravating factors: any motions, sitting up, doing hair, getting dressed all will increase pain to 10/10 Relieving factors: not moving, flexeril pain can be 0/10  PRECAUTIONS: None  RED FLAGS: None   WEIGHT BEARING RESTRICTIONS: No  FALLS:  Has patient fallen in last 6 months? Yes. 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  DIAGNOSTIC FINDINGS:  Found greater tuberosity fracture right humerus  COGNITION: Overall cognitive status: Within functional limits for tasks assessed     SENSATION: WFL  POSTURE: Fwd head, and rounded shoulders  UPPER EXTREMITY ROM: all motions caused   Active ROM Right AROM eval RIGHT PROM eval  Shoulder flexion 65 82  Shoulder extension    Shoulder abduction 30 45  Shoulder adduction    Shoulder internal rotation 15 20  Shoulder external rotation 20 45  Elbow flexion    Elbow extension    Wrist flexion    Wrist extension    Wrist ulnar deviation    Wrist radial deviation    Wrist pronation    Wrist supination    (Blank rows = not tested)  UPPER EXTREMITY MMT:  not tested due to pain  MMT Right eval  Left eval  Shoulder flexion    Shoulder extension    Shoulder abduction    Shoulder adduction    Shoulder internal rotation    Shoulder external rotation    Middle trapezius    Lower trapezius    Elbow flexion    Elbow extension    Wrist flexion    Wrist extension    Wrist ulnar deviation    Wrist radial deviation    Wrist pronation    Wrist supination    Grip strength (lbs)    (Blank rows = not tested)  SHOULDER SPECIAL TESTS: None tested due to pain  JOINT MOBILITY TESTING:  N/T  PALPATION:  Very tight and tender in the upper trap, neck, rhomboid and right upper arm mms QUICK DASH = 86.4%                                                                                                                            TREATMENT DATE:  07/02/24 NuStep L 3 x 6 min  AAROM Flex, Ext, IR x10 Rows yellow 2x10 RUE IR yellow 2x10 RUE ER yellow 2x10 Genteel PROM to RUE  06/27/24 Evaluation HEP   PATIENT EDUCATION: Education details: POC/HEP Person educated: Patient Education method: Programmer, multimedia, Facilities manager, Actor cues, Verbal cues, and Handouts Education comprehension: verbalized understanding  HOME EXERCISE PROGRAM: Access Code: [email or web address] URL: [email or web address] Date: 06/27/2024 Prepared by: Ozell Mainland  Exercises - Seated Shoulder Shrugs  - 2 x daily - 7 x weekly - 1 sets - 10 reps - 3 hold - Seated Scapular Retraction  - 2 x daily - 7 x weekly - 1 sets - 10 reps - 3 hold - Seated Shoulder Flexion Towel Slide at Table Top  - 2 x daily - 7 x weekly - 1 sets - 10 reps - 5 hold - Seated Shoulder External Rotation PROM on Table  - 2 x daily - 7 x weekly - 1 sets - 10 reps - 5 hold - Shoulder Flexion Wall Slide with Towel  - 2 x daily - 7 x weekly - 1 sets - 10 reps - 3 hold - Shoulder Circles on Wall with Towel  - 2 x daily - 7 x weekly - 1 sets - 10 reps - 3 hold  ASSESSMENT:  CLINICAL IMPRESSION: Patient is a 58 y.o. female who was seen  today for physical therapy treatment for right shoulder greater tubercle fracture, she is and has been in a lot of pain and very limited ROM. Introduced pt to some light passive and active activity of the RUE within pain limitation. Motion was very limited with AAROM using cane. Pt does question if she needs another MRI.  OBJECTIVE IMPAIRMENTS: decreased activity tolerance, decreased endurance, decreased ROM, decreased strength, increased fascial restrictions, increased muscle spasms, impaired flexibility, impaired UE functional use, improper body mechanics, postural dysfunction, and pain.   REHAB  POTENTIAL: Good  CLINICAL DECISION MAKING: Evolving/moderate complexity  EVALUATION COMPLEXITY: Moderate   GOALS: Goals reviewed with patient? Yes  SHORT TERM GOALS: Target date: 07/16/24  Independent with initial HEP Baseline: Goal status: INITIAL  LONG TERM GOALS: Target date: 09/26/24  Independent with advanced HEP Baseline:  Goal status: INITIAL  2.  Decrease pain 50% Baseline:  Goal status: INITIAL  3.  Increase AROM of the right shoulder to 150 degrees Baseline:  Goal status: INITIAL  4.  Increase AROM of the right shoulder abduction to 130 degrees Baseline:  Goal status: INITIAL  5.  Be able to do hair without difficulty Baseline:  Goal status: INITIAL  6.  Be able to dress wihtout difficulty Baseline:  Goal status: INITIAL  PLAN:  PT FREQUENCY: 2x/week  PT DURATION: 12 weeks  PLANNED INTERVENTIONS: 97164- PT Re-evaluation, 97110-Therapeutic exercises, 97530- Therapeutic activity, 97112- Neuromuscular re-education, 97535- Self Care, 02859- Manual therapy, G0283- Electrical stimulation (unattended), 97035- Ultrasound, Patient/Family education, Taping, Cryotherapy, and Moist heat  PLAN FOR NEXT SESSION: slowly get her moving, any AROM increases pain, has very limited ROM   Tanda KANDICE Sorrow, PTA 07/02/2024, 1:44 PM

## 2024-07-05 ENCOUNTER — Ambulatory Visit: Admitting: Physical Therapy

## 2024-07-05 ENCOUNTER — Encounter: Payer: Self-pay | Admitting: Physical Therapy

## 2024-07-05 DIAGNOSIS — M25511 Pain in right shoulder: Secondary | ICD-10-CM | POA: Diagnosis not present

## 2024-07-05 DIAGNOSIS — M25611 Stiffness of right shoulder, not elsewhere classified: Secondary | ICD-10-CM

## 2024-07-05 NOTE — Therapy (Addendum)
 OUTPATIENT PHYSICAL THERAPY SHOULDER EVALUATION   Patient Name: [name] MRN: [id number] DOB:07-14-1966, 58 y.o., female Today's Date: 07/05/2024  END OF SESSION:  PT End of Session - 07/05/24 1424     Visit Number 3    Date for Recertification  09/26/24    PT Start Time 1425    PT Stop Time 1510    PT Time Calculation (min) 45 min    Activity Tolerance Patient tolerated treatment well    Behavior During Therapy [facility] for tasks assessed/performed          Past Medical History:  Diagnosis Date   Allergy    Anxiety    Arthritis    Back pain    Constipation    Depression    Dizziness    Family history of malignant neoplasm of digestive organs 12/21/2021   Gastroparesis    [facility]   GERD (gastroesophageal reflux disease)    Heartburn    High cholesterol    History of colonic diverticulitis - recurrent with stricture 12/21/2021   IBS (irritable bowel syndrome)    Lactose intolerance    Lumbar radiculopathy 08/12/2022   Alm Molt, MD  [facility] [place])   Migraine    [facility]   Osteoarthritis    PONV (postoperative nausea and vomiting)    Pre-diabetes    Stomach ulcer    Weight decrease    Past Surgical History:  Procedure Laterality Date   ABDOMINAL HYSTERECTOMY     abdominalplasty     s/p weight loss   BACK SURGERY     BLOOD PATCH  2019   brachiplasty     s/p weight loss    CHOLECYSTECTOMY  09/04/2012   Procedure: LAPAROSCOPIC CHOLECYSTECTOMY;  Surgeon: Vicenta DELENA Poli, MD;  Location: [facility] OR;  Service: General;  Laterality: N/A;   COLONOSCOPY  08/21/2012   few scattered sigmoid diverticula: one sessile right colon polyp removed by cold biopsies x2:  patchy erythema in the left colon multiple random biopsies done:  normal therminal ileum   COSMETIC SURGERY     KNEE ARTHROSCOPY W/ ACL RECONSTRUCTION     in early 1990's   LUMBAR PUNCTURE  2019   TONSILLECTOMY     Patient Active Problem List   Diagnosis Date Noted    Blurry vision 12/28/2023   Dizziness 09/19/2023   Sensorineural hearing loss, bilateral 09/19/2023   Left-sided tinnitus 09/19/2023   Benign paroxysmal vertigo of right ear 09/19/2023   Mixed hyperlipidemia 05/04/2023   Insulin  resistance 05/04/2023   Vitamin D  deficiency 05/04/2023   Other fatigue 04/19/2023   SOBOE (shortness of breath on exertion) 04/19/2023   Anxiety and depression 04/19/2023   Pre-diabetes 04/19/2023   Lumbar radiculopathy 04/19/2023   Depression screen 04/19/2023   BMI 33.0-33.9,adult 04/19/2023   Generalized Obesity with starting BMI 33.6 04/19/2023   Tick bite of right front wall of thorax 02/01/2023   Gastroparesis, Idiopathic 02/01/2023   Class 1 obesity in adult 02/01/2023   Acute hip pain, left 02/01/2023   Chronic midline low back pain with left-sided sciatica 04/22/2022   Diverticulitis 01/21/2022   MCI (mild cognitive impairment) 01/21/2022   Insomnia 01/21/2022   Anxiety 01/21/2022   Hematochezia 01/21/2022   Stricture of sigmoid colon (HCC) 12/21/2021   Irritable bowel syndrome 12/31/2020   Chronic migraine without aura 03/29/2014    PCP: A. Sebastian, MD  REFERRING PROVIDER: Sherida, MD  REFERRING DIAG: right greater tuberosity fracture humerus  THERAPY DIAG:  Stiffness of right shoulder, not elsewhere classified  Acute pain of right shoulder  Rationale for Evaluation and Treatment: Rehabilitation  ONSET DATE: May 12, 2024  SUBJECTIVE:                                                                                                                                                                                      SUBJECTIVE STATEMENT: Got in the Jacuzzi yesterday and arm was feeling good, and was able to reach up, now she is having pain.    Patient had a fall on August 2, she hit the knee and her chin, she reports never had a bruise or anything.  Reports that has had significant pain and loss of ROM of the right shoulder.   Immediate x-rays were negative.  Continued to have significant pain, she had further testing showed a greater tuberosity fracture, also some fraying of the RC mms.  MD order is for ROM and gentle progression Hand dominance: Right  PERTINENT HISTORY: As above  PAIN:  Are you having pain? Yes: NPRS scale: 7/10 Pain location: right anterior shoulder and superior shoulder Pain description: burns, ache Aggravating factors: any motions, sitting up, doing hair, getting dressed all will increase pain to 10/10 Relieving factors: not moving, flexeril pain can be 0/10  PRECAUTIONS: None  RED FLAGS: None   WEIGHT BEARING RESTRICTIONS: No  FALLS:  Has patient fallen in last 6 months? Yes. 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  DIAGNOSTIC FINDINGS:  Found greater tuberosity fracture right humerus  COGNITION: Overall cognitive status: Within functional limits for tasks assessed     SENSATION: WFL  POSTURE: Fwd head, and rounded shoulders  UPPER EXTREMITY ROM: all motions caused   Active ROM Right AROM eval RIGHT PROM eval Right 07/05/24  Shoulder flexion 65 82   Shoulder extension     Shoulder abduction 30 45   Shoulder adduction     Shoulder internal rotation 15 20   Shoulder external rotation 20 45   Elbow flexion     Elbow extension     Wrist flexion     Wrist extension     Wrist ulnar deviation     Wrist radial deviation     Wrist  pronation     Wrist supination     (Blank rows = not tested)  UPPER EXTREMITY MMT:  not tested  due to pain  MMT Right eval Left eval  Shoulder flexion    Shoulder extension    Shoulder abduction    Shoulder adduction    Shoulder internal rotation    Shoulder external rotation    Middle trapezius    Lower trapezius    Elbow flexion    Elbow extension    Wrist flexion    Wrist extension    Wrist ulnar deviation    Wrist radial deviation    Wrist pronation    Wrist supination    Grip strength (lbs)    (Blank rows = not tested)  SHOULDER SPECIAL TESTS: None tested due to pain  JOINT MOBILITY TESTING:  N/T  PALPATION:  Very tight and tender in the upper trap, neck, rhomboid and right upper arm mms QUICK DASH = 86.4%                                                                                                                            TREATMENT DATE:  07/05/24 NuStep L 3 x 6 min  AAROM Flex, Ext, IR, Abd w/ cane  x10 Flex, Abd, circles pillow case on wall RUE x10 Rows 20lb & lats 15lb 2x10  RUE IR/ER yellow 2x10 RUE PROM with end range holds E stim IFC R shoulder x10  07/02/24 NuStep L 3 x 6 min  AAROM Flex, Ext, IR x10 Rows yellow 2x10 RUE IR yellow 2x10 RUE ER yellow 2x10 Genteel PROM to RUE  06/27/24 Evaluation HEP   PATIENT EDUCATION: Education details: POC/HEP Person educated: Patient Education method: Programmer, multimedia, Facilities manager, Actor cues, Verbal cues, and Handouts Education comprehension: verbalized understanding  HOME EXERCISE PROGRAM: Access Code: [email or web address] URL: [email or web address] Date: 06/27/2024 Prepared by: Ozell Mainland  Exercises - Seated Shoulder Shrugs  - 2 x daily - 7 x weekly - 1 sets - 10 reps - 3 hold - Seated Scapular Retraction  - 2 x daily - 7 x weekly - 1 sets - 10 reps - 3 hold - Seated Shoulder Flexion Towel Slide at Table Top  - 2 x daily - 7 x weekly - 1 sets - 10 reps - 5 hold - Seated Shoulder External Rotation PROM on Table  - 2 x daily - 7 x weekly - 1 sets - 10 reps - 5 hold - Shoulder  Flexion Wall Slide with Towel  - 2 x daily - 7 x weekly - 1 sets - 10 reps - 3 hold - Shoulder Circles on Wall with Towel  - 2 x daily - 7 x weekly - 1 sets - 10 reps - 3 hold  ASSESSMENT:  CLINICAL IMPRESSION: Patient is a 58 y.o. female who was seen today for physical therapy treatment for right shoulder greater tubercle fracture, She continues to be in a lot of pain and very limited  ROM. Continued with some light passive and active activity of the RUE within pain limitation. She was able to tolerated more interventions today.  Motion was very limited with AAROM using cane. Pt does question if she needs another MRI.  OBJECTIVE IMPAIRMENTS: decreased activity tolerance, decreased endurance, decreased ROM, decreased strength, increased fascial restrictions, increased muscle spasms, impaired flexibility, impaired UE functional use, improper body mechanics, postural dysfunction, and pain.   REHAB POTENTIAL: Good  CLINICAL DECISION MAKING: Evolving/moderate complexity  EVALUATION COMPLEXITY: Moderate   GOALS: Goals reviewed with patient? Yes  SHORT TERM GOALS: Target date: 07/16/24  Independent with initial HEP Baseline: Goal status: Met 07/05/24  LONG TERM GOALS: Target date: 09/26/24  Independent with advanced HEP Baseline:  Goal status: INITIAL  2.  Decrease pain 50% Baseline:  Goal status: ongoing 07/05/24   3.  Increase AROM of the right shoulder to 150 degrees Baseline:  Goal status: INITIAL  4.  Increase AROM of the right shoulder abduction to 130 degrees Baseline:  Goal status: INITIAL  5.  Be able to do hair without difficulty Baseline:  Goal status: ongoing 07/05/24  6.  Be able to dress wihtout difficulty Baseline:  Goal status: ongoing 07/05/24  PLAN:  PT FREQUENCY: 2x/week  PT DURATION: 12 weeks  PLANNED INTERVENTIONS: 97164- PT Re-evaluation, 97110-Therapeutic exercises, 97530- Therapeutic activity, 97112- Neuromuscular re-education, 97535- Self Care,  97140- Manual therapy, G0283- Electrical stimulation (unattended), 97035- Ultrasound, Patient/Family education, Taping, Cryotherapy, and Moist heat  PLAN FOR NEXT SESSION: slowly get her moving, any AROM increases pain, has very limited ROM   Tanda KANDICE Sorrow, PTA 07/05/2024, 2:25 PM

## 2024-07-11 ENCOUNTER — Encounter: Payer: Self-pay | Admitting: Physical Therapy

## 2024-07-11 ENCOUNTER — Ambulatory Visit: Attending: Orthopedic Surgery | Admitting: Physical Therapy

## 2024-07-11 DIAGNOSIS — M25511 Pain in right shoulder: Secondary | ICD-10-CM | POA: Insufficient documentation

## 2024-07-11 DIAGNOSIS — M25611 Stiffness of right shoulder, not elsewhere classified: Secondary | ICD-10-CM | POA: Diagnosis not present

## 2024-07-11 NOTE — Therapy (Signed)
 OUTPATIENT PHYSICAL THERAPY SHOULDER EVALUATION   Patient Name: [name] MRN: [id number] DOB:June 08, 1966, 58 y.o., female Today's Date: 07/11/2024  END OF SESSION:  PT End of Session - 07/11/24 1551     Visit Number 4    Date for Recertification  09/26/24    PT Start Time 1555    PT Stop Time 1640    PT Time Calculation (min) 45 min    Activity Tolerance Patient tolerated treatment well    Behavior During Therapy [facility] for tasks assessed/performed          Past Medical History:  Diagnosis Date   Allergy    Anxiety    Arthritis    Back pain    Constipation    Depression    Dizziness    Family history of malignant neoplasm of digestive organs 12/21/2021   Gastroparesis    [facility]   GERD (gastroesophageal reflux disease)    Heartburn    High cholesterol    History of colonic diverticulitis - recurrent with stricture 12/21/2021   IBS (irritable bowel syndrome)    Lactose intolerance    Lumbar radiculopathy 08/12/2022   Alm Molt, MD  [facility] [place])   Migraine    [facility]   Osteoarthritis    PONV (postoperative nausea and vomiting)    Pre-diabetes    Stomach ulcer    Weight decrease    Past Surgical History:  Procedure Laterality Date   ABDOMINAL HYSTERECTOMY     abdominalplasty     s/p weight loss   BACK SURGERY     BLOOD PATCH  2019   brachiplasty     s/p weight loss    CHOLECYSTECTOMY  09/04/2012   Procedure: LAPAROSCOPIC CHOLECYSTECTOMY;  Surgeon: Vicenta DELENA Poli, MD;  Location: [facility] OR;  Service: General;  Laterality: N/A;   COLONOSCOPY  08/21/2012   few scattered sigmoid diverticula: one sessile right colon polyp removed by cold biopsies x2:  patchy erythema in the left colon multiple random biopsies done:  normal therminal ileum   COSMETIC SURGERY     KNEE ARTHROSCOPY W/ ACL RECONSTRUCTION     in early 1990's   LUMBAR PUNCTURE  2019   TONSILLECTOMY     Patient Active Problem List   Diagnosis Date Noted    Blurry vision 12/28/2023   Dizziness 09/19/2023   Sensorineural hearing loss, bilateral 09/19/2023   Left-sided tinnitus 09/19/2023   Benign paroxysmal vertigo of right ear 09/19/2023   Mixed hyperlipidemia 05/04/2023   Insulin  resistance 05/04/2023   Vitamin D  deficiency 05/04/2023   Other fatigue 04/19/2023   SOBOE (shortness of breath on exertion) 04/19/2023   Anxiety and depression 04/19/2023   Pre-diabetes 04/19/2023   Lumbar radiculopathy 04/19/2023   Depression screen 04/19/2023   BMI 33.0-33.9,adult 04/19/2023   Generalized Obesity with starting BMI 33.6 04/19/2023   Tick bite of right front wall of thorax 02/01/2023   Gastroparesis, Idiopathic 02/01/2023   Class 1 obesity in adult 02/01/2023   Acute hip pain, left 02/01/2023   Chronic midline low back pain with left-sided sciatica 04/22/2022   Diverticulitis 01/21/2022   MCI (mild cognitive impairment) 01/21/2022   Insomnia 01/21/2022   Anxiety 01/21/2022   Hematochezia 01/21/2022   Stricture of sigmoid colon (HCC) 12/21/2021   Irritable bowel syndrome 12/31/2020   Chronic migraine without aura 03/29/2014    PCP: A. Sebastian, MD  REFERRING PROVIDER: Sherida, MD  REFERRING DIAG: right greater tuberosity fracture humerus  THERAPY DIAG:  Stiffness of right shoulder, not elsewhere classified  Acute pain of right shoulder  Rationale for Evaluation and Treatment: Rehabilitation  ONSET DATE: May 12, 2024  SUBJECTIVE:                                                                                                                                                                                      SUBJECTIVE STATEMENT: It hurts, had to take Oxycodone  twice this week. Haven't take it for several weeks   Patient had a fall on August 2, she hit the knee and her chin, she reports never had a bruise or anything.  Reports that has had significant pain and loss of ROM of the right shoulder.  Immediate x-rays were  negative.  Continued to have significant pain, she had further testing showed a greater tuberosity fracture, also some fraying of the RC mms.  MD order is for ROM and gentle progression Hand dominance: Right  PERTINENT HISTORY: As above  PAIN:  Are you having pain? Yes: NPRS scale: 8/10 Pain location: right anterior shoulder and superior shoulder Pain description: burns, ache Aggravating factors: any motions, sitting up, doing hair, getting dressed all will increase pain to 10/10 Relieving factors: not moving, flexeril pain can be 0/10  PRECAUTIONS: None  RED FLAGS: None   WEIGHT BEARING RESTRICTIONS: No  FALLS:  Has patient fallen in last 6 months? Yes. 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  DIAGNOSTIC FINDINGS:  Found greater tuberosity fracture right humerus  COGNITION: Overall cognitive status: Within functional limits for tasks assessed     SENSATION: WFL  POSTURE: Fwd head, and rounded shoulders  UPPER EXTREMITY ROM: all motions caused   Active ROM Right AROM eval RIGHT PROM eval Right 07/11/24  Shoulder flexion 65 82 86  Shoulder extension     Shoulder abduction 30 45 72  Shoulder adduction     Shoulder internal rotation 15 20   Shoulder external rotation 20 45   Elbow flexion     Elbow extension     Wrist flexion     Wrist extension     Wrist ulnar deviation     Wrist radial deviation     Wrist pronation     Wrist supination     (  Blank rows = not tested)  UPPER EXTREMITY MMT:  not tested due to pain  MMT  Right eval Left eval  Shoulder flexion    Shoulder extension    Shoulder abduction    Shoulder adduction    Shoulder internal rotation    Shoulder external rotation    Middle trapezius    Lower trapezius    Elbow flexion    Elbow extension    Wrist flexion    Wrist extension    Wrist ulnar deviation    Wrist radial deviation    Wrist pronation    Wrist supination    Grip strength (lbs)    (Blank rows = not tested)  SHOULDER SPECIAL TESTS: None tested due to pain  JOINT MOBILITY TESTING:  N/T  PALPATION:  Very tight and tender in the upper trap, neck, rhomboid and right upper arm mms QUICK DASH = 86.4%                                                                                                                            TREATMENT DATE:  07/11/24 NuStep L 3 x 6 min  Rows yellow  2x10 Shoulder Ext 2x10 E stim IFC R shoulder x10 w/ MHP  07/05/24 NuStep L 3 x 6 min  AAROM Flex, Ext, IR, Abd w/ cane  x10 Flex, Abd, circles pillow case on wall RUE x10 Rows 20lb & lats 15lb 2x10  RUE IR/ER yellow 2x10 RUE PROM with end range holds E stim IFC R shoulder x10  07/02/24 NuStep L 3 x 6 min  AAROM Flex, Ext, IR x10 Rows yellow 2x10 RUE IR yellow 2x10 RUE ER yellow 2x10 Genteel PROM to RUE  06/27/24 Evaluation HEP   PATIENT EDUCATION: Education details: POC/HEP Person educated: Patient Education method: Programmer, multimedia, Facilities manager, Actor cues, Verbal cues, and Handouts Education comprehension: verbalized understanding  HOME EXERCISE PROGRAM: Access Code: [email or web address] URL: [email or web address] Date: 06/27/2024 Prepared by: Ozell Mainland  Exercises - Seated Shoulder Shrugs  - 2 x daily - 7 x weekly - 1 sets - 10 reps - 3 hold - Seated Scapular Retraction  - 2 x daily - 7 x weekly - 1 sets - 10 reps - 3 hold - Seated Shoulder Flexion Towel Slide at Table Top  - 2 x daily - 7 x weekly - 1 sets - 10 reps - 5 hold - Seated Shoulder External  Rotation PROM on Table  - 2 x daily - 7 x weekly - 1 sets - 10 reps - 5 hold - Shoulder Flexion Wall Slide with Towel  - 2 x daily - 7 x weekly - 1 sets - 10 reps - 3 hold - Shoulder Circles on Wall with Towel  - 2 x daily - 7 x weekly - 1 sets - 10 reps - 3 hold  ASSESSMENT:  CLINICAL IMPRESSION: Patient is a 58 y.o. female who was seen today for physical therapy treatment for right shoulder greater tubercle  fracture and rotator cuff fraying. She enters with increase pain without any relief. She has improved some increasing her AROM. Postural cues needed with rows and ext. IFC and MHP for pain. She returns to the MD next week.  OBJECTIVE IMPAIRMENTS: decreased activity tolerance, decreased endurance, decreased ROM, decreased strength, increased fascial restrictions, increased muscle spasms, impaired flexibility, impaired UE functional use, improper body mechanics, postural dysfunction, and pain.   REHAB POTENTIAL: Good  CLINICAL DECISION MAKING: Evolving/moderate complexity  EVALUATION COMPLEXITY: Moderate   GOALS: Goals reviewed with patient? Yes  SHORT TERM GOALS: Target date: 07/16/24  Independent with initial HEP Baseline: Goal status: Met 07/05/24  LONG TERM GOALS: Target date: 09/26/24  Independent with advanced HEP Baseline:  Goal status: INITIAL  2.  Decrease pain 50% Baseline:  Goal status: ongoing 07/05/24   3.  Increase AROM of the right shoulder to 150 degrees Baseline:  Goal status: INITIAL  4.  Increase AROM of the right shoulder abduction to 130 degrees Baseline:  Goal status: INITIAL  5.  Be able to do hair without difficulty Baseline:  Goal status: ongoing 07/05/24  6.  Be able to dress wihtout difficulty Baseline:  Goal status: ongoing 07/05/24  PLAN:  PT FREQUENCY: 2x/week  PT DURATION: 12 weeks  PLANNED INTERVENTIONS: 97164- PT Re-evaluation, 97110-Therapeutic exercises, 97530- Therapeutic activity, 97112- Neuromuscular re-education, 97535-  Self Care, [id number]- Manual therapy, G0283- Electrical stimulation (unattended), 97035- Ultrasound, Patient/Family education, Taping, Cryotherapy, and Moist heat  PLAN FOR NEXT SESSION: slowly get her moving, any AROM increases pain, has very limited ROM   Tanda KANDICE Sorrow, PTA 07/11/2024, 3:52 PM

## 2024-07-16 ENCOUNTER — Encounter

## 2024-07-18 DIAGNOSIS — S42254D Nondisplaced fracture of greater tuberosity of right humerus, subsequent encounter for fracture with routine healing: Secondary | ICD-10-CM | POA: Diagnosis not present

## 2024-07-19 ENCOUNTER — Encounter: Admitting: Physical Therapy

## 2024-07-23 ENCOUNTER — Ambulatory Visit

## 2024-07-23 ENCOUNTER — Other Ambulatory Visit: Payer: Self-pay

## 2024-07-23 DIAGNOSIS — M25511 Pain in right shoulder: Secondary | ICD-10-CM | POA: Diagnosis not present

## 2024-07-23 DIAGNOSIS — M25611 Stiffness of right shoulder, not elsewhere classified: Secondary | ICD-10-CM

## 2024-07-23 NOTE — Therapy (Signed)
 OUTPATIENT PHYSICAL THERAPY SHOULDER EVALUATION   Patient Name: [name] MRN: [id number] DOB:1966-08-05, 58 y.o., female Today's Date: 07/23/2024  END OF SESSION:  PT End of Session - 07/23/24 1358     Visit Number 5    Date for Recertification  09/26/24    Authorization Type BCBS    PT Start Time 1303    PT Stop Time 1341    PT Time Calculation (min) 38 min    Activity Tolerance Patient tolerated treatment well    Behavior During Therapy [facility] for tasks assessed/performed           Past Medical History:  Diagnosis Date   Allergy    Anxiety    Arthritis    Back pain    Constipation    Depression    Dizziness    Family history of malignant neoplasm of digestive organs 12/21/2021   Gastroparesis    [facility]   GERD (gastroesophageal reflux disease)    Heartburn    High cholesterol    History of colonic diverticulitis - recurrent with stricture 12/21/2021   IBS (irritable bowel syndrome)    Lactose intolerance    Lumbar radiculopathy 08/12/2022   Alm Molt, MD  [facility] [place])   Migraine    [facility]   Osteoarthritis    PONV (postoperative nausea and vomiting)    Pre-diabetes    Stomach ulcer    Weight decrease    Past Surgical History:  Procedure Laterality Date   ABDOMINAL HYSTERECTOMY     abdominalplasty     s/p weight loss   BACK SURGERY     BLOOD PATCH  2019   brachiplasty     s/p weight loss    CHOLECYSTECTOMY  09/04/2012   Procedure: LAPAROSCOPIC CHOLECYSTECTOMY;  Surgeon: Vicenta DELENA Poli, MD;  Location: [facility] OR;  Service: General;  Laterality: N/A;   COLONOSCOPY  08/21/2012   few scattered sigmoid diverticula: one sessile right colon polyp removed by cold biopsies x2:  patchy erythema in the left colon multiple random biopsies done:  normal therminal ileum   COSMETIC SURGERY     KNEE ARTHROSCOPY W/ ACL RECONSTRUCTION     in early 1990's   LUMBAR PUNCTURE  2019   TONSILLECTOMY     Patient Active  Problem List   Diagnosis Date Noted   Blurry vision 12/28/2023   Dizziness 09/19/2023   Sensorineural hearing loss, bilateral 09/19/2023   Left-sided tinnitus 09/19/2023   Benign paroxysmal vertigo of right ear 09/19/2023   Mixed hyperlipidemia 05/04/2023   Insulin  resistance 05/04/2023   Vitamin D  deficiency 05/04/2023   Other fatigue 04/19/2023   SOBOE (shortness of breath on exertion) 04/19/2023   Anxiety and depression 04/19/2023   Pre-diabetes 04/19/2023   Lumbar radiculopathy 04/19/2023   Depression screen 04/19/2023   BMI 33.0-33.9,adult 04/19/2023   Generalized Obesity with starting BMI 33.6 04/19/2023   Tick bite of right front wall of thorax 02/01/2023   Gastroparesis, Idiopathic 02/01/2023   Class 1 obesity in adult 02/01/2023   Acute hip pain, left 02/01/2023   Chronic midline low back pain with left-sided sciatica 04/22/2022   Diverticulitis 01/21/2022   MCI (mild cognitive impairment) 01/21/2022   Insomnia 01/21/2022   Anxiety 01/21/2022   Hematochezia 01/21/2022   Stricture of sigmoid colon (HCC) 12/21/2021   Irritable bowel syndrome 12/31/2020   Chronic migraine without aura 03/29/2014    PCP: A. Sebastian, MD  REFERRING PROVIDER: Sherida, MD  REFERRING DIAG: right greater  tuberosity fracture humerus  THERAPY DIAG:  Stiffness of right shoulder, not elsewhere classified  Acute pain of right shoulder  Rationale for Evaluation and Treatment: Rehabilitation  ONSET DATE: May 12, 2024  SUBJECTIVE:                                                                                                                                                                                      SUBJECTIVE STATEMENT: When I saw the surgeon he put me on a prednisone  dose pack, but I fell going up the steps last week on Thursday the 16th on the steps going up, and landed on my R shoulder. And R side.  I called the surgeon to get back in and they want me to finish the prednisone   and see how I do. I don't feel like it did when I broke it but it really hurts when I try to lift it, in the front of my shoulder  I'll finsh the steroid tomorrow   Patient had a fall on August 2, she hit the knee and her chin, she reports never had a bruise or anything.  Reports that has had significant pain and loss of ROM of the right shoulder.  Immediate x-rays were negative.  Continued to have significant pain, she had further testing showed a greater tuberosity fracture, also some fraying of the RC mms.  MD order is for ROM and gentle progression Hand dominance: Right  PERTINENT HISTORY: As above  PAIN:  Are you having pain? Yes: NPRS scale: 8/10 Pain location: right anterior shoulder and superior shoulder Pain description: burns, ache Aggravating factors: any motions, sitting up, doing hair, getting dressed all will increase pain to 10/10 Relieving factors: not moving, flexeril pain can be 0/10  PRECAUTIONS: None  RED FLAGS: None   WEIGHT BEARING RESTRICTIONS: No  FALLS:  Has patient fallen in last 6 months? Yes. 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  DIAGNOSTIC FINDINGS:  Found greater tuberosity fracture right humerus  COGNITION: Overall cognitive status: Within functional limits for tasks assessed     SENSATION: WFL  POSTURE: Fwd head, and rounded shoulders  UPPER EXTREMITY ROM: all motions caused   Active ROM Right AROM eval RIGHT PROM eval Right  07/11/24 07/23/24 PROM/AROM R   Shoulder flexion 65 82 86 125/42   Shoulder extension      Shoulder abduction 30 45 72   Shoulder adduction      Shoulder internal rotation 15 20  55  Shoulder external rotation 20 45  65  Elbow flexion      Elbow extension      Wrist flexion      Wrist extension      Wrist ulnar deviation      Wrist radial deviation      Wrist pronation      Wrist supination      (Blank rows = not tested)  UPPER EXTREMITY MMT:  not tested due to pain  MMT Right eval Left eval  Shoulder flexion    Shoulder extension    Shoulder abduction    Shoulder adduction    Shoulder internal rotation    Shoulder external rotation    Middle trapezius    Lower trapezius    Elbow flexion    Elbow extension    Wrist flexion    Wrist extension    Wrist ulnar deviation    Wrist radial deviation    Wrist pronation    Wrist supination    Grip strength (lbs)    (Blank rows = not tested)  SHOULDER SPECIAL TESTS: None tested due to pain  JOINT MOBILITY TESTING:  N/T  PALPATION:  Very tight and tender in the upper trap, neck, rhomboid and right upper arm mms QUICK DASH = 86.4%                                                                                                                            TREATMENT DATE:  07/23/24:  Reassessed PROM and AROM as compared to previous visits Palpation with exquisite tenderness R biceps muscle belly and long head biceps. Rx: supine for AAROM R shoulder, retrograde massage R biceps , light cross friction massage R biceps long head. Pt with poor tolerance to the biceps massage Kinesiotaping R shoulder, 2-I pieces, ant and post delts extending to middle and upper traps  Instructed in isometrics, in sitting, using L hand and the chair arm rest, chair back for resistance.  Also isometrics for biceps and triceps in sitting.  Pt tolerated all movements well except marked pain with isometrics R shoulder abd so discontinued this motion   07/11/24 NuStep L 3 x 6 min  Rows yellow  2x10 Shoulder Ext  2x10 E stim IFC R shoulder x10 w/ MHP  07/05/24 NuStep L 3 x 6 min  AAROM Flex, Ext, IR, Abd w/ cane  x10 Flex, Abd, circles pillow case on wall RUE x10 Rows 20lb & lats 15lb 2x10  RUE IR/ER yellow 2x10 RUE PROM with end range holds E stim IFC R shoulder x10  07/02/24 NuStep L 3 x 6 min  AAROM Flex, Ext, IR x10 Rows yellow 2x10 RUE IR yellow 2x10 RUE ER yellow 2x10  [place]enteel PROM to RUE  06/27/24 Evaluation HEP   PATIENT EDUCATION: Education details: POC/HEP Person educated: Patient Education method: Explanation, Demonstration, Actor cues, Verbal cues, and Handouts Education comprehension: verbalized understanding  HOME EXERCISE PROGRAM: Access Code: [email or web address] URL: [email or web address] Date: 07/23/2024 Prepared by: Dayanne Yiu  Exercises - Seated Shoulder Shrugs  - 2 x daily - 7 x weekly - 1 sets - 10 reps - 3 hold - Seated Scapular Retraction  - 2 x daily - 7 x weekly - 1 sets - 10 reps - 3 hold - Seated Shoulder Flexion Towel Slide at Table Top  - 2 x daily - 7 x weekly - 1 sets - 10 reps - 5 hold - Seated Shoulder External Rotation PROM on Table  - 2 x daily - 7 x weekly - 1 sets - 10 reps - 5 hold - Shoulder Flexion Wall Slide with Towel  - 2 x daily - 7 x weekly - 1 sets - 10 reps - 3 hold - Shoulder Circles on Wall with Towel  - 2 x daily - 7 x weekly - 1 sets - 10 reps - 3 hold - Isometric Shoulder External Rotation  - 1 x daily - 7 x weekly - 3 sets - 10 reps - Isometric Shoulder Extension at Wall  - 1 x daily - 7 x weekly - 3 sets - 10 reps - Isometric Shoulder Adduction  - 1 x daily - 7 x weekly - 3 sets - 10 reps - Isometric Shoulder Flexion with Ball at Wall  - 1 x daily - 7 x weekly - 3 sets - 10 reps - Isometric Shoulder Internal Rotation  - 1 x daily - 7 x weekly - 3 sets - 10 reps Access Code: [email or web address] URL: [email or web address] Date: 06/27/2024 Prepared by: Ozell Mainland  Exercises - Seated Shoulder Shrugs  - 2 x daily  - 7 x weekly - 1 sets - 10 reps - 3 hold - Seated Scapular Retraction  - 2 x daily - 7 x weekly - 1 sets - 10 reps - 3 hold - Seated Shoulder Flexion Towel Slide at Table Top  - 2 x daily - 7 x weekly - 1 sets - 10 reps - 5 hold - Seated Shoulder External Rotation PROM on Table  - 2 x daily - 7 x weekly - 1 sets - 10 reps - 5 hold - Shoulder Flexion Wall Slide with Towel  - 2 x daily - 7 x weekly - 1 sets - 10 reps - 3 hold - Shoulder Circles on Wall with Towel  - 2 x daily - 7 x weekly - 1 sets - 10 reps - 3 hold  ASSESSMENT:  CLINICAL IMPRESSION: Patient is a 59 y.o. female who participated today with physical therapy treatment for right shoulder greater tubercle fracture and rotator cuff fraying, due to a fall.  Unfortunately she had another fall last week and has increased R shoulder pain.  Her pain is primarily located R anterior upper arm and over biceps, with decreased AROM for R shoulder abd and flexion as compared to her most recent measurements.  She is going to return again this week in 2 days. Will see how she responded to today's Rx. Postural cues needed with rows and ext. IFC and MHP for pain. She returns to the MD next week.  OBJECTIVE IMPAIRMENTS: decreased activity tolerance, decreased endurance, decreased ROM, decreased strength, increased fascial restrictions, increased muscle spasms, impaired flexibility, impaired UE functional use, improper body  mechanics, postural dysfunction, and pain.   REHAB POTENTIAL: Good  CLINICAL DECISION MAKING: Evolving/moderate complexity  EVALUATION COMPLEXITY: Moderate   GOALS: Goals reviewed with patient? Yes  SHORT TERM GOALS: Target date: 07/16/24  Independent with initial HEP Baseline: Goal status: Met 07/05/24  LONG TERM GOALS: Target date: 09/26/24  Independent with advanced HEP Baseline:  Goal status: INITIAL  2.  Decrease pain 50% Baseline:  Goal status: ongoing 07/05/24   3.  Increase AROM of the right shoulder to 150  degrees Baseline:  Goal status: INITIAL  4.  Increase AROM of the right shoulder abduction to 130 degrees Baseline:  Goal status: INITIAL  5.  Be able to do hair without difficulty Baseline:  Goal status: ongoing 07/05/24  6.  Be able to dress wihtout difficulty Baseline:  Goal status: ongoing 07/05/24  PLAN:  PT FREQUENCY: 2x/week  PT DURATION: 12 weeks  PLANNED INTERVENTIONS: 97164- PT Re-evaluation, 97110-Therapeutic exercises, 97530- Therapeutic activity, 97112- Neuromuscular re-education, 97535- Self Care, [id number]- Manual therapy, [id number]- Electrical stimulation (unattended), 97035- Ultrasound, Patient/Family education, Taping, Cryotherapy, and Moist heat  PLAN FOR NEXT SESSION: slowly get her moving, has very limited ROM, she may be returning to MD with her most recent fall for additional assessment   Irma Roulhac L Chalese Peach, PT, DPT, OCS 07/23/2024, 2:00 PM

## 2024-07-25 ENCOUNTER — Ambulatory Visit: Admitting: Physical Therapy

## 2024-07-26 DIAGNOSIS — S42254D Nondisplaced fracture of greater tuberosity of right humerus, subsequent encounter for fracture with routine healing: Secondary | ICD-10-CM | POA: Diagnosis not present

## 2024-07-30 ENCOUNTER — Ambulatory Visit: Admitting: Physical Therapy

## 2024-07-30 ENCOUNTER — Encounter: Payer: Self-pay | Admitting: Physical Therapy

## 2024-07-30 DIAGNOSIS — M25611 Stiffness of right shoulder, not elsewhere classified: Secondary | ICD-10-CM

## 2024-07-30 DIAGNOSIS — M25511 Pain in right shoulder: Secondary | ICD-10-CM | POA: Diagnosis not present

## 2024-07-30 NOTE — Therapy (Signed)
 OUTPATIENT PHYSICAL THERAPY SHOULDER EVALUATION   Patient Name: [name] MRN: [id number] DOB:[birth year], 58 y.o., female Today's Date: 07/30/2024  END OF SESSION:  PT End of Session - 07/30/24 1303     Visit Number 6    Date for Recertification  09/26/24    PT Start Time 1302    PT Stop Time 1345    PT Time Calculation (min) 43 min    Activity Tolerance Patient tolerated treatment well    Behavior During Therapy [facility] for tasks assessed/performed           Past Medical History:  Diagnosis Date   Allergy    Anxiety    Arthritis    Back pain    Constipation    Depression    Dizziness    Family history of malignant neoplasm of digestive organs 12/21/2021   Gastroparesis    [facility]   GERD (gastroesophageal reflux disease)    Heartburn    High cholesterol    History of colonic diverticulitis - recurrent with stricture 12/21/2021   IBS (irritable bowel syndrome)    Lactose intolerance    Lumbar radiculopathy 08/12/2022   Alm Molt, MD  [facility] [place])   Migraine    [facility]   Osteoarthritis    PONV (postoperative nausea and vomiting)    Pre-diabetes    Stomach ulcer    Weight decrease    Past Surgical History:  Procedure Laterality Date   ABDOMINAL HYSTERECTOMY     abdominalplasty     s/p weight loss   BACK SURGERY     BLOOD PATCH  2019   brachiplasty     s/p weight loss    CHOLECYSTECTOMY  09/04/2012   Procedure: LAPAROSCOPIC CHOLECYSTECTOMY;  Surgeon: Vicenta DELENA Poli, MD;  Location: [facility] OR;  Service: General;  Laterality: N/A;   COLONOSCOPY  08/21/2012   few scattered sigmoid diverticula: one sessile right colon polyp removed by cold biopsies x2:  patchy erythema in the left colon multiple random biopsies done:  normal therminal ileum   COSMETIC SURGERY     KNEE ARTHROSCOPY W/ ACL RECONSTRUCTION     in early 1990's   LUMBAR PUNCTURE  2019   TONSILLECTOMY     Patient Active Problem List   Diagnosis Date  Noted   Blurry vision 12/28/2023   Dizziness 09/19/2023   Sensorineural hearing loss, bilateral 09/19/2023   Left-sided tinnitus 09/19/2023   Benign paroxysmal vertigo of right ear 09/19/2023   Mixed hyperlipidemia 05/04/2023   Insulin  resistance 05/04/2023   Vitamin D  deficiency 05/04/2023   Other fatigue 04/19/2023   SOBOE (shortness of breath on exertion) 04/19/2023   Anxiety and depression 04/19/2023   Pre-diabetes 04/19/2023   Lumbar radiculopathy 04/19/2023   Depression screen 04/19/2023   BMI 33.0-33.9,adult 04/19/2023   Generalized Obesity with starting BMI 33.6 04/19/2023   Tick bite of right front wall of thorax 02/01/2023   Gastroparesis, Idiopathic 02/01/2023   Class 1 obesity in adult 02/01/2023   Acute hip pain, left 02/01/2023   Chronic midline low back pain with left-sided sciatica 04/22/2022   Diverticulitis 01/21/2022   MCI (mild cognitive impairment) 01/21/2022   Insomnia 01/21/2022   Anxiety 01/21/2022   Hematochezia 01/21/2022   Stricture of sigmoid colon (HCC) 12/21/2021   Irritable bowel syndrome 12/31/2020   Chronic migraine without aura 03/29/2014    PCP: A. Sebastian, MD  REFERRING PROVIDER: Sherida, MD  REFERRING DIAG: right greater tuberosity fracture humerus  THERAPY DIAG:  Stiffness of right shoulder, not elsewhere classified  Acute pain of right shoulder  Rationale for Evaluation and Treatment: Rehabilitation  ONSET DATE: May 12, 2024  SUBJECTIVE:                                                                                                                                                                                      SUBJECTIVE STATEMENT: MD has put in for MRI, waiting for it to be scheduled. Continue PT until she gets MRI    Patient had a fall on August 2, she hit the knee and her chin, she reports never had a bruise or anything.  Reports that has had significant pain and loss of ROM of the right shoulder.  Immediate x-rays  were negative.  Continued to have significant pain, she had further testing showed a greater tuberosity fracture, also some fraying of the RC mms.  MD order is for ROM and gentle progression Hand dominance: Right  PERTINENT HISTORY: As above  PAIN:  Are you having pain? Yes: NPRS scale: 4/10 Pain location: right anterior shoulder and superior shoulder Pain description: burns, ache Aggravating factors: any motions, sitting up, doing hair, getting dressed all will increase pain to 10/10 Relieving factors: not moving, flexeril pain can be 0/10  PRECAUTIONS: None  RED FLAGS: None   WEIGHT BEARING RESTRICTIONS: No  FALLS:  Has patient fallen in last 6 months? Yes. 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  DIAGNOSTIC FINDINGS:  Found greater tuberosity fracture right humerus  COGNITION: Overall cognitive status: Within functional limits for tasks assessed     SENSATION: WFL  POSTURE: Fwd head, and rounded shoulders  UPPER EXTREMITY ROM: all motions caused   Active ROM Right AROM eval RIGHT PROM eval Right 07/11/24 07/23/24 PROM/AROM R  07/30/24 R AROM  Shoulder flexion 65 82 86 125/42 70  Shoulder extension       Shoulder abduction 30 45 72  75  Shoulder adduction       Shoulder internal rotation 15 20  55   Shoulder external rotation 20 45  65   Elbow flexion       Elbow extension       Wrist flexion  Wrist extension       Wrist ulnar deviation       Wrist radial deviation       Wrist pronation       Wrist supination        (Blank rows = not tested)  UPPER EXTREMITY MMT:  not tested due to pain  MMT Right eval Left eval  Shoulder flexion    Shoulder extension    Shoulder abduction    Shoulder adduction    Shoulder internal rotation    Shoulder external rotation    Middle trapezius    Lower trapezius    Elbow flexion    Elbow extension    Wrist flexion    Wrist extension    Wrist ulnar deviation    Wrist radial deviation    Wrist pronation    Wrist supination    Grip strength (lbs)    (Blank rows = not tested)  SHOULDER SPECIAL TESTS: None tested due to pain  JOINT MOBILITY TESTING:  N/T  PALPATION:  Very tight and tender in the upper trap, neck, rhomboid and right upper arm mms QUICK DASH = 86.4%                                                                                                                            TREATMENT DATE:  07/30/24 NuStep L 5 x 6 min Quick dash 84.1 / 100 = 84.1 % AAROM 2lb WaTE Flex, Ext, IR x10   ABD cane  RUE PROM with end rang holds   JT mods  Ionto anterior R shoulder 1mL dex 4 hour patch   07/23/24:  Reassessed PROM and AROM as compared to previous visits Palpation with exquisite tenderness R biceps muscle belly and long head biceps. Rx: supine for AAROM R shoulder, retrograde massage R biceps , light cross friction massage R biceps long head. Pt with poor tolerance to the biceps massage Kinesiotaping R shoulder, 2-I pieces, ant and post delts extending to middle and upper traps  Instructed in isometrics, in sitting, using L hand and the chair arm rest, chair back for resistance.  Also isometrics for biceps and triceps in sitting.  Pt tolerated all movements well except marked pain with isometrics R shoulder abd so discontinued this motion   07/11/24 NuStep L 3 x 6 min  Rows yellow  2x10 Shoulder Ext 2x10 E stim IFC R shoulder x10 w/ MHP  07/05/24 NuStep L 3 x 6 min  AAROM Flex, Ext, IR, Abd w/ cane  x10 Flex, Abd, circles pillow case  on wall RUE x10 Rows 20lb & lats 15lb 2x10  RUE IR/ER yellow 2x10 RUE PROM with end range holds E stim IFC R shoulder x10  07/02/24 NuStep L 3 x 6 min  AAROM Flex, Ext, IR x10 Rows yellow 2x10 RUE IR yellow 2x10 RUE ER yellow 2x10 Genteel PROM to RUE  06/27/24 Evaluation HEP   PATIENT EDUCATION: Education details: POC/HEP Person educated:  Patient Education method: Explanation, Demonstration, Tactile cues, Verbal cues, and Handouts Education comprehension: verbalized understanding  HOME EXERCISE PROGRAM: Access Code: [email or web address] URL: [email or web address] Date: 07/23/2024 Prepared by: Amy Speaks  Exercises - Seated Shoulder Shrugs  - 2 x daily - 7 x weekly - 1 sets - 10 reps - 3 hold - Seated Scapular Retraction  - 2 x daily - 7 x weekly - 1 sets - 10 reps - 3 hold - Seated Shoulder Flexion Towel Slide at Table Top  - 2 x daily - 7 x weekly - 1 sets - 10 reps - 5 hold - Seated Shoulder External Rotation PROM on Table  - 2 x daily - 7 x weekly - 1 sets - 10 reps - 5 hold - Shoulder Flexion Wall Slide with Towel  - 2 x daily - 7 x weekly - 1 sets - 10 reps - 3 hold - Shoulder Circles on Wall with Towel  - 2 x daily - 7 x weekly - 1 sets - 10 reps - 3 hold - Isometric Shoulder External Rotation  - 1 x daily - 7 x weekly - 3 sets - 10 reps - Isometric Shoulder Extension at Wall  - 1 x daily - 7 x weekly - 3 sets - 10 reps - Isometric Shoulder Adduction  - 1 x daily - 7 x weekly - 3 sets - 10 reps - Isometric Shoulder Flexion with Ball at Wall  - 1 x daily - 7 x weekly - 3 sets - 10 reps - Isometric Shoulder Internal Rotation  - 1 x daily - 7 x weekly - 3 sets - 10 reps Access Code: [email or web address] URL: [email or web address] Date: 06/27/2024 Prepared by: Ozell Mainland  Exercises - Seated Shoulder Shrugs  - 2 x daily - 7 x weekly - 1 sets - 10 reps - 3 hold - Seated Scapular Retraction  - 2 x daily - 7 x weekly - 1 sets - 10 reps - 3 hold - Seated  Shoulder Flexion Towel Slide at Table Top  - 2 x daily - 7 x weekly - 1 sets - 10 reps - 5 hold - Seated Shoulder External Rotation PROM on Table  - 2 x daily - 7 x weekly - 1 sets - 10 reps - 5 hold - Shoulder Flexion Wall Slide with Towel  - 2 x daily - 7 x weekly - 1 sets - 10 reps - 3 hold - Shoulder Circles on Wall with Towel  - 2 x daily - 7 x weekly - 1 sets - 10 reps - 3 hold  ASSESSMENT:  CLINICAL IMPRESSION: Patient is a 58 y.o. female who participated today with physical therapy treatment for right shoulder greater tubercle fracture and rotator cuff fraying, due to a fall.  Unfortunately she had another fall two week ago and has increased R shoulder pain. Pain present during session, with active and passive interventions.  Pt guarded throughout session. Slight improvement with AROM. Pt is currently waiting to schedule MRI.  Ionto no charde spoke to lead PT about adding it to POC.  OBJECTIVE IMPAIRMENTS: decreased activity tolerance, decreased endurance, decreased ROM, decreased strength, increased fascial restrictions, increased muscle spasms, impaired flexibility, impaired UE functional use, improper body mechanics, postural dysfunction, and pain.   REHAB POTENTIAL: Good  CLINICAL DECISION MAKING: Evolving/moderate complexity  EVALUATION COMPLEXITY: Moderate   GOALS: Goals reviewed with patient? Yes  SHORT TERM GOALS: Target date: 07/16/24  Independent with initial HEP Baseline: Goal status: Met 07/05/24  LONG TERM GOALS: Target date: 09/26/24  Independent with advanced HEP Baseline:  Goal status: INITIAL  2.  Decrease pain 50% Baseline:  Goal status: ongoing 07/05/24, Ongoing 07/30/24  3.  Increase AROM of the right shoulder to 150 degrees Baseline:  Goal status: INITIAL, ongoing 07/30/24  4.  Increase AROM of the right shoulder abduction to 130 degrees Baseline:  Goal status: INITIAL  5.  Be able to do hair without difficulty Baseline:  Goal status: ongoing  07/05/24, Ongoing 07/30/24  6.  Be able to dress wihtout difficulty Baseline:  Goal status: ongoing 07/05/24, Ongoing 07/30/24  PLAN:  PT FREQUENCY: 2x/week  PT DURATION: 12 weeks  PLANNED INTERVENTIONS: [id number]- PT Re-evaluation, 97110-Therapeutic exercises, 97530- Therapeutic activity, 97112- Neuromuscular re-education, 97535- Self Care, [id number]- Manual therapy, [id number]- Electrical stimulation (unattended), 97035- Ultrasound, Patient/Family education, Taping, Cryotherapy, and Moist heat  PLAN FOR NEXT SESSION: slowly get her moving, has very limited ROM, she may be returning to MD with her most recent fall for additional assessment   Tanda KANDICE Sorrow, PTA,  07/30/2024, 1:04 PM

## 2024-08-02 DIAGNOSIS — G3184 Mild cognitive impairment, so stated: Secondary | ICD-10-CM | POA: Diagnosis not present

## 2024-08-02 DIAGNOSIS — Z8669 Personal history of other diseases of the nervous system and sense organs: Secondary | ICD-10-CM | POA: Diagnosis not present

## 2024-08-02 DIAGNOSIS — G4700 Insomnia, unspecified: Secondary | ICD-10-CM | POA: Diagnosis not present

## 2024-08-06 ENCOUNTER — Ambulatory Visit: Admitting: Physical Therapy

## 2024-08-08 ENCOUNTER — Encounter: Payer: Self-pay | Admitting: Physical Therapy

## 2024-08-08 ENCOUNTER — Ambulatory Visit: Admitting: Physical Therapy

## 2024-08-08 DIAGNOSIS — M25511 Pain in right shoulder: Secondary | ICD-10-CM

## 2024-08-08 DIAGNOSIS — M25611 Stiffness of right shoulder, not elsewhere classified: Secondary | ICD-10-CM | POA: Diagnosis not present

## 2024-08-08 NOTE — Therapy (Signed)
 OUTPATIENT PHYSICAL THERAPY SHOULDER EVALUATION   Patient Name: [name] MRN: [id number] DOB:1965-10-20, 58 y.o., female Today's Date: 08/08/2024  END OF SESSION:  PT End of Session - 08/08/24 1101     Visit Number 7    Date for Recertification  09/26/24    PT Start Time 1101    PT Stop Time 1145    PT Time Calculation (min) 44 min    Activity Tolerance Patient tolerated treatment well    Behavior During Therapy [facility] for tasks assessed/performed           Past Medical History:  Diagnosis Date   Allergy    Anxiety    Arthritis    Back pain    Constipation    Depression    Dizziness    Family history of malignant neoplasm of digestive organs 12/21/2021   Gastroparesis    [facility]   GERD (gastroesophageal reflux disease)    Heartburn    High cholesterol    History of colonic diverticulitis - recurrent with stricture 12/21/2021   IBS (irritable bowel syndrome)    Lactose intolerance    Lumbar radiculopathy 08/12/2022   Alm Molt, MD  [facility] [place])   Migraine    [facility]   Osteoarthritis    PONV (postoperative nausea and vomiting)    Pre-diabetes    Stomach ulcer    Weight decrease    Past Surgical History:  Procedure Laterality Date   ABDOMINAL HYSTERECTOMY     abdominalplasty     s/p weight loss   BACK SURGERY     BLOOD PATCH  2019   brachiplasty     s/p weight loss    CHOLECYSTECTOMY  09/04/2012   Procedure: LAPAROSCOPIC CHOLECYSTECTOMY;  Surgeon: Vicenta DELENA Poli, MD;  Location: [facility] OR;  Service: General;  Laterality: N/A;   COLONOSCOPY  08/21/2012   few scattered sigmoid diverticula: one sessile right colon polyp removed by cold biopsies x2:  patchy erythema in the left colon multiple random biopsies done:  normal therminal ileum   COSMETIC SURGERY     KNEE ARTHROSCOPY W/ ACL RECONSTRUCTION     in early 1990's   LUMBAR PUNCTURE  2019   TONSILLECTOMY     Patient Active Problem List   Diagnosis Date  Noted   Blurry vision 12/28/2023   Dizziness 09/19/2023   Sensorineural hearing loss, bilateral 09/19/2023   Left-sided tinnitus 09/19/2023   Benign paroxysmal vertigo of right ear 09/19/2023   Mixed hyperlipidemia 05/04/2023   Insulin  resistance 05/04/2023   Vitamin D  deficiency 05/04/2023   Other fatigue 04/19/2023   SOBOE (shortness of breath on exertion) 04/19/2023   Anxiety and depression 04/19/2023   Pre-diabetes 04/19/2023   Lumbar radiculopathy 04/19/2023   Depression screen 04/19/2023   BMI 33.0-33.9,adult 04/19/2023   Generalized Obesity with starting BMI 33.6 04/19/2023   Tick bite of right front wall of thorax 02/01/2023   Gastroparesis, Idiopathic 02/01/2023   Class 1 obesity in adult 02/01/2023   Acute hip pain, left 02/01/2023   Chronic midline low back pain with left-sided sciatica 04/22/2022   Diverticulitis 01/21/2022   MCI (mild cognitive impairment) 01/21/2022   Insomnia 01/21/2022   Anxiety 01/21/2022   Hematochezia 01/21/2022   Stricture of sigmoid colon (HCC) 12/21/2021   Irritable bowel syndrome 12/31/2020   Chronic migraine without aura 03/29/2014    PCP: A. Sebastian, MD  REFERRING PROVIDER: Sherida, MD  REFERRING DIAG: right greater tuberosity fracture humerus  THERAPY DIAG:  Stiffness of right shoulder, not elsewhere classified  Acute pain of right shoulder  Rationale for Evaluation and Treatment: Rehabilitation  ONSET DATE: May 12, 2024  SUBJECTIVE:                                                                                                                                                                                      SUBJECTIVE STATEMENT: MRI is Monday, Shoulder is just stiff, doing stretches and HEP   Patient had a fall on August 2, she hit the knee and her chin, she reports never had a bruise or anything.  Reports that has had significant pain and loss of ROM of the right shoulder.  Immediate x-rays were negative.  Continued  to have significant pain, she had further testing showed a greater tuberosity fracture, also some fraying of the RC mms.  MD order is for ROM and gentle progression Hand dominance: Right  PERTINENT HISTORY: As above  PAIN:  Are you having pain? Yes: NPRS scale: 0/10 Pain location: right anterior shoulder and superior shoulder Pain description: burns, ache Aggravating factors: any motions, sitting up, doing hair, getting dressed all will increase pain to 10/10 Relieving factors: not moving, flexeril pain can be 0/10  PRECAUTIONS: None  RED FLAGS: None   WEIGHT BEARING RESTRICTIONS: No  FALLS:  Has patient fallen in last 6 months? Yes. 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  DIAGNOSTIC FINDINGS:  Found greater tuberosity fracture right humerus  COGNITION: Overall cognitive status: Within functional limits for tasks assessed     SENSATION: WFL  POSTURE: Fwd head, and rounded shoulders  UPPER EXTREMITY ROM: all motions caused   Active ROM Right AROM eval RIGHT PROM eval Right 07/11/24 07/23/24 PROM/AROM R  07/30/24 R AROM  Shoulder flexion 65 82 86 125/42 70  Shoulder extension       Shoulder abduction 30 45 72  75  Shoulder adduction       Shoulder internal rotation 15 20  55   Shoulder external rotation 20 45  65   Elbow flexion       Elbow extension       Wrist flexion       Wrist extension  Wrist ulnar deviation       Wrist radial deviation       Wrist pronation       Wrist supination       (Blank rows = not  tested)  UPPER EXTREMITY MMT:  not tested due to pain  MMT Right eval Left eval  Shoulder flexion    Shoulder extension    Shoulder abduction    Shoulder adduction    Shoulder internal rotation    Shoulder external rotation    Middle trapezius    Lower trapezius    Elbow flexion    Elbow extension    Wrist flexion    Wrist extension    Wrist ulnar deviation    Wrist radial deviation    Wrist pronation    Wrist supination    Grip strength (lbs)    (Blank rows = not tested)  SHOULDER SPECIAL TESTS: None tested due to pain  JOINT MOBILITY TESTING:  N/T  PALPATION:  Very tight and tender in the upper trap, neck, rhomboid and right upper arm mms QUICK DASH = 86.4%                                                                                                                            TREATMENT DATE:  08/08/24 UBE L 2 x 3 min each RUE flex  Circles & abd with pillow case at wall x10 Seated Rows 20lb & Lats 15lb 2x10 Shoulder ER yellow 2x10 Shoulder Flex 1lb 2x10  Shoulder abd 1lb 2x10 Ionto anterior R shoulder 1mL dex 4 hour patch    07/30/24 NuStep L 5 x 6 min Quick dash 84.1 / 100 = 84.1 % AAROM 2lb WaTE Flex, Ext, IR x10   ABD cane  RUE PROM with end rang holds   JT mods  Ionto anterior R shoulder 1mL dex 4 hour patch   07/23/24:  Reassessed PROM and AROM as compared to previous visits Palpation with exquisite tenderness R biceps muscle belly and long head biceps. Rx: supine for AAROM R shoulder, retrograde massage R biceps , light cross friction massage R biceps long head. Pt with poor tolerance to the biceps massage Kinesiotaping R shoulder, 2-I pieces, ant and post delts extending to middle and upper traps  Instructed in isometrics, in sitting, using L hand and the chair arm rest, chair back for resistance.  Also isometrics for biceps and triceps in sitting.  Pt tolerated all movements well except marked pain with isometrics R shoulder abd so  discontinued this motion   07/11/24 NuStep L 3 x 6 min  Rows yellow  2x10 Shoulder Ext 2x10 E stim IFC R shoulder x10 w/ MHP  07/05/24 NuStep L 3 x 6 min  AAROM Flex, Ext, IR, Abd w/ cane  x10 Flex, Abd, circles pillow case on wall RUE x10 Rows 20lb & lats 15lb 2x10  RUE IR/ER yellow 2x10 RUE PROM with end range holds E stim IFC R  shoulder x10  07/02/24 NuStep L 3 x 6 min  AAROM Flex, Ext, IR x10 Rows yellow 2x10 RUE IR yellow 2x10 RUE ER yellow 2x10 Genteel PROM to RUE  06/27/24 Evaluation HEP   PATIENT EDUCATION: Education details: POC/HEP Person educated: Patient Education method: Programmer, Multimedia, Demonstration, Actor cues, Verbal cues, and Handouts Education comprehension: verbalized understanding  HOME EXERCISE PROGRAM: Access Code: [email or web address] URL: [email or web address] Date: 07/23/2024 Prepared by: Amy Speaks  Exercises - Seated Shoulder Shrugs  - 2 x daily - 7 x weekly - 1 sets - 10 reps - 3 hold - Seated Scapular Retraction  - 2 x daily - 7 x weekly - 1 sets - 10 reps - 3 hold - Seated Shoulder Flexion Towel Slide at Table Top  - 2 x daily - 7 x weekly - 1 sets - 10 reps - 5 hold - Seated Shoulder External Rotation PROM on Table  - 2 x daily - 7 x weekly - 1 sets - 10 reps - 5 hold - Shoulder Flexion Wall Slide with Towel  - 2 x daily - 7 x weekly - 1 sets - 10 reps - 3 hold - Shoulder Circles on Wall with Towel  - 2 x daily - 7 x weekly - 1 sets - 10 reps - 3 hold - Isometric Shoulder External Rotation  - 1 x daily - 7 x weekly - 3 sets - 10 reps - Isometric Shoulder Extension at Wall  - 1 x daily - 7 x weekly - 3 sets - 10 reps - Isometric Shoulder Adduction  - 1 x daily - 7 x weekly - 3 sets - 10 reps - Isometric Shoulder Flexion with Ball at Wall  - 1 x daily - 7 x weekly - 3 sets - 10 reps - Isometric Shoulder Internal Rotation  - 1 x daily - 7 x weekly - 3 sets - 10 reps Access Code: [email or web address] URL: [email or web address] Date:  06/27/2024 Prepared by: Ozell Mainland  Exercises - Seated Shoulder Shrugs  - 2 x daily - 7 x weekly - 1 sets - 10 reps - 3 hold - Seated Scapular Retraction  - 2 x daily - 7 x weekly - 1 sets - 10 reps - 3 hold - Seated Shoulder Flexion Towel Slide at Table Top  - 2 x daily - 7 x weekly - 1 sets - 10 reps - 5 hold - Seated Shoulder External Rotation PROM on Table  - 2 x daily - 7 x weekly - 1 sets - 10 reps - 5 hold - Shoulder Flexion Wall Slide with Towel  - 2 x daily - 7 x weekly - 1 sets - 10 reps - 3 hold - Shoulder Circles on Wall with Towel  - 2 x daily - 7 x weekly - 1 sets - 10 reps - 3 hold  ASSESSMENT:  CLINICAL IMPRESSION: Patient is a 58 y.o. female who participated today with physical therapy treatment for right shoulder greater tubercle fracture and rotator cuff fraying, due to a fall.  Unfortunately she has fallen again causing more shoulder pain. Pain present during session, she has an MRI scheduled for Monday. She was able to tolerate a more active session.  Postural cues needed with shoulder ER and seated rows. Some weakness with shoulder flexion and abduction. waiting to schedule MRI.  Ionto no charge spoke to lead PT.  OBJECTIVE IMPAIRMENTS: decreased activity tolerance, decreased endurance, decreased ROM, decreased strength, increased fascial restrictions, increased muscle spasms, impaired flexibility, impaired UE  functional use, improper body mechanics, postural dysfunction, and pain.   REHAB POTENTIAL: Good  CLINICAL DECISION MAKING: Evolving/moderate complexity  EVALUATION COMPLEXITY: Moderate   GOALS: Goals reviewed with patient? Yes  SHORT TERM GOALS: Target date: 07/16/24  Independent with initial HEP Baseline: Goal status: Met 07/05/24  LONG TERM GOALS: Target date: 09/26/24  Independent with advanced HEP Baseline:  Goal status: INITIAL  2.  Decrease pain 50% Baseline:  Goal status: ongoing 07/05/24, Ongoing 07/30/24  3.  Increase AROM of the  right shoulder to 150 degrees Baseline:  Goal status: INITIAL, ongoing 07/30/24  4.  Increase AROM of the right shoulder abduction to 130 degrees Baseline:  Goal status: INITIAL  5.  Be able to do hair without difficulty Baseline:  Goal status: ongoing 07/05/24, Ongoing 07/30/24  6.  Be able to dress wihtout difficulty Baseline:  Goal status: ongoing 07/05/24, Ongoing 07/30/24  PLAN:  PT FREQUENCY: 2x/week  PT DURATION: 12 weeks  PLANNED INTERVENTIONS: 97164- PT Re-evaluation, 97110-Therapeutic exercises, 97530- Therapeutic activity, 97112- Neuromuscular re-education, 97535- Self Care, [id number]- Manual therapy, [id number]- Electrical stimulation (unattended), 97035- Ultrasound, Patient/Family education, Taping, Cryotherapy, and Moist heat  PLAN FOR NEXT SESSION: slowly get her moving, has very limited ROM, she may be returning to MD with her most recent fall for additional assessment   Tanda KANDICE Sorrow, PTA,  08/08/2024, 11:02 AM

## 2024-08-13 ENCOUNTER — Ambulatory Visit

## 2024-08-13 DIAGNOSIS — M25511 Pain in right shoulder: Secondary | ICD-10-CM | POA: Diagnosis not present

## 2024-08-15 ENCOUNTER — Ambulatory Visit

## 2024-08-16 DIAGNOSIS — Z01419 Encounter for gynecological examination (general) (routine) without abnormal findings: Secondary | ICD-10-CM | POA: Diagnosis not present

## 2024-08-16 DIAGNOSIS — Z6833 Body mass index (BMI) 33.0-33.9, adult: Secondary | ICD-10-CM | POA: Diagnosis not present

## 2024-08-17 ENCOUNTER — Other Ambulatory Visit: Payer: Self-pay | Admitting: Obstetrics and Gynecology

## 2024-08-17 DIAGNOSIS — Z800 Family history of malignant neoplasm of digestive organs: Secondary | ICD-10-CM

## 2024-08-20 ENCOUNTER — Ambulatory Visit: Attending: Orthopedic Surgery | Admitting: Physical Therapy

## 2024-08-20 ENCOUNTER — Encounter: Payer: Self-pay | Admitting: Physical Therapy

## 2024-08-20 DIAGNOSIS — M25611 Stiffness of right shoulder, not elsewhere classified: Secondary | ICD-10-CM | POA: Diagnosis not present

## 2024-08-20 DIAGNOSIS — M25511 Pain in right shoulder: Secondary | ICD-10-CM | POA: Insufficient documentation

## 2024-08-20 NOTE — Therapy (Signed)
 OUTPATIENT PHYSICAL THERAPY SHOULDER EVALUATION   Patient Name: [name] MRN: [id number] DOB:Mar 23, 1966, 58 y.o., female Today's Date: 08/20/2024  END OF SESSION:  PT End of Session - 08/20/24 1149     Visit Number 8    Date for Recertification  09/26/24    PT Start Time 1149    PT Stop Time 1230    PT Time Calculation (min) 41 min    Activity Tolerance Patient tolerated treatment well    Behavior During Therapy [facility] for tasks assessed/performed           Past Medical History:  Diagnosis Date   Allergy    Anxiety    Arthritis    Back pain    Constipation    Depression    Dizziness    Family history of malignant neoplasm of digestive organs 12/21/2021   Gastroparesis    [facility]   GERD (gastroesophageal reflux disease)    Heartburn    High cholesterol    History of colonic diverticulitis - recurrent with stricture 12/21/2021   IBS (irritable bowel syndrome)    Lactose intolerance    Lumbar radiculopathy 08/12/2022   Alm Molt, MD  [facility] [place])   Migraine    [facility]   Osteoarthritis    PONV (postoperative nausea and vomiting)    Pre-diabetes    Stomach ulcer    Weight decrease    Past Surgical History:  Procedure Laterality Date   ABDOMINAL HYSTERECTOMY     abdominalplasty     s/p weight loss   BACK SURGERY     BLOOD PATCH  2019   brachiplasty     s/p weight loss    CHOLECYSTECTOMY  09/04/2012   Procedure: LAPAROSCOPIC CHOLECYSTECTOMY;  Surgeon: Vicenta DELENA Poli, MD;  Location: [facility] OR;  Service: General;  Laterality: N/A;   COLONOSCOPY  08/21/2012   few scattered sigmoid diverticula: one sessile right colon polyp removed by cold biopsies x2:  patchy erythema in the left colon multiple random biopsies done:  normal therminal ileum   COSMETIC SURGERY     KNEE ARTHROSCOPY W/ ACL RECONSTRUCTION     in early 1990's   LUMBAR PUNCTURE  2019   TONSILLECTOMY     Patient Active Problem List   Diagnosis Date  Noted   Blurry vision 12/28/2023   Dizziness 09/19/2023   Sensorineural hearing loss, bilateral 09/19/2023   Left-sided tinnitus 09/19/2023   Benign paroxysmal vertigo of right ear 09/19/2023   Mixed hyperlipidemia 05/04/2023   Insulin  resistance 05/04/2023   Vitamin D  deficiency 05/04/2023   Other fatigue 04/19/2023   SOBOE (shortness of breath on exertion) 04/19/2023   Anxiety and depression 04/19/2023   Pre-diabetes 04/19/2023   Lumbar radiculopathy 04/19/2023   Depression screen 04/19/2023   BMI 33.0-33.9,adult 04/19/2023   Generalized Obesity with starting BMI 33.6 04/19/2023   Tick bite of right front wall of thorax 02/01/2023   Gastroparesis, Idiopathic 02/01/2023   Class 1 obesity in adult 02/01/2023   Acute hip pain, left 02/01/2023   Chronic midline low back pain with left-sided sciatica 04/22/2022   Diverticulitis 01/21/2022   MCI (mild cognitive impairment) 01/21/2022   Insomnia 01/21/2022   Anxiety 01/21/2022   Hematochezia 01/21/2022   Stricture of sigmoid colon (HCC) 12/21/2021   Irritable bowel syndrome 12/31/2020   Chronic migraine without aura 03/29/2014    PCP: A. Sebastian, MD  REFERRING PROVIDER: Sherida, MD  REFERRING DIAG: right greater tuberosity fracture humerus  THERAPY DIAG:  Stiffness of right shoulder, not elsewhere classified  Acute pain of right shoulder  Rationale for Evaluation and Treatment: Rehabilitation  ONSET DATE: May 12, 2024  SUBJECTIVE:                                                                                                                                                                                      SUBJECTIVE STATEMENT: Go back Wednesday to get MRI results. Been doing stuff, morning are bad. Feel like the range is better than what it was. Can't do the side reach.   Patient had a fall on August 2, she hit the knee and her chin, she reports never had a bruise or anything.  Reports that has had significant pain  and loss of ROM of the right shoulder.  Immediate x-rays were negative.  Continued to have significant pain, she had further testing showed a greater tuberosity fracture, also some fraying of the RC mms.  MD order is for ROM and gentle progression Hand dominance: Right  PERTINENT HISTORY: As above  PAIN:  Are you having pain? Yes: NPRS scale: 0/10 Pain location: right anterior shoulder and superior shoulder Pain description: burns, ache Aggravating factors: any motions, sitting up, doing hair, getting dressed all will increase pain to 10/10 Relieving factors: not moving, flexeril pain can be 0/10  PRECAUTIONS: None  RED FLAGS: None   WEIGHT BEARING RESTRICTIONS: No  FALLS:  Has patient fallen in last 6 months? Yes. 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  DIAGNOSTIC FINDINGS:  Found greater tuberosity fracture right humerus  COGNITION: Overall cognitive status: Within functional limits for tasks assessed     SENSATION: WFL  POSTURE: Fwd head, and rounded shoulders  UPPER EXTREMITY ROM: all motions caused   Active ROM Right AROM eval RIGHT PROM eval Right 07/11/24 07/23/24 PROM/AROM R  07/30/24 R AROM Right 08/20/24  Shoulder flexion 65 82 86 125/42 70 115  Shoulder extension        Shoulder abduction 30 45 72  75 92  Shoulder adduction        Shoulder internal rotation 15 20  55    Shoulder external rotation 20 45  65    Elbow flexion  Elbow extension        Wrist flexion        Wrist extension        Wrist ulnar  deviation        Wrist radial deviation        Wrist pronation        Wrist supination        (Blank rows = not tested)  UPPER EXTREMITY MMT:  not tested due to pain  MMT Right eval Left eval  Shoulder flexion    Shoulder extension    Shoulder abduction    Shoulder adduction    Shoulder internal rotation    Shoulder external rotation    Middle trapezius    Lower trapezius    Elbow flexion    Elbow extension    Wrist flexion    Wrist extension    Wrist ulnar deviation    Wrist radial deviation    Wrist pronation    Wrist supination    Grip strength (lbs)    (Blank rows = not tested)  SHOULDER SPECIAL TESTS: None tested due to pain  JOINT MOBILITY TESTING:  N/T  PALPATION:  Very tight and tender in the upper trap, neck, rhomboid and right upper arm mms QUICK DASH = 86.4%                                                                                                                            TREATMENT DATE:  08/20/24 NuStep  5 x 6 min  AAROM Flex, Ext, IR 1lb WaTE x10 Seated Rows 25lb & Lats 15lb 2x10 Shoulder Flex 1lb 2x10  Shoulder Ext green 2x10 Towel stretch  08/08/24 UBE L 2 x 3 min each RUE flex  Circles & abd with pillow case at wall x10 Seated Rows 20lb & Lats 15lb 2x10 Shoulder ER yellow 2x10 Shoulder Flex 1lb 2x10  Shoulder abd 1lb 2x10 Ionto anterior R shoulder 1mL dex 4 hour patch    07/30/24 NuStep L 5 x 6 min Quick dash 84.1 / 100 = 84.1 % AAROM 2lb WaTE Flex, Ext, IR x10   ABD cane  RUE PROM with end rang holds   JT mods  Ionto anterior R shoulder 1mL dex 4 hour patch   07/23/24:  Reassessed PROM and AROM as compared to previous visits Palpation with exquisite tenderness R biceps muscle belly and long head biceps. Rx: supine for AAROM R shoulder, retrograde massage R biceps , light cross friction massage R biceps long head. Pt with poor tolerance to the biceps massage Kinesiotaping R shoulder, 2-I pieces, ant and post delts  extending to middle and upper traps  Instructed in isometrics, in sitting, using L hand and the chair arm rest, chair back for resistance.  Also isometrics for biceps and triceps in sitting.  Pt tolerated all movements well except marked pain with isometrics R shoulder abd so discontinued this motion   07/11/24 NuStep L 3 x 6  min  Rows yellow  2x10 Shoulder Ext 2x10 E stim IFC R shoulder x10 w/ MHP  07/05/24 NuStep L 3 x 6 min  AAROM Flex, Ext, IR, Abd w/ cane  x10 Flex, Abd, circles pillow case on wall RUE x10 Rows 20lb & lats 15lb 2x10  RUE IR/ER yellow 2x10 RUE PROM with end range holds E stim IFC R shoulder x10  07/02/24 NuStep L 3 x 6 min  AAROM Flex, Ext, IR x10 Rows yellow 2x10 RUE IR yellow 2x10 RUE ER yellow 2x10 Genteel PROM to RUE  06/27/24 Evaluation HEP   PATIENT EDUCATION: Education details: POC/HEP Person educated: Patient Education method: Programmer, Multimedia, Facilities Manager, Actor cues, Verbal cues, and Handouts Education comprehension: verbalized understanding  HOME EXERCISE PROGRAM: Access Code: [email or web address] URL: [email or web address] Date: 07/23/2024 Prepared by: Amy Speaks  Exercises - Seated Shoulder Shrugs  - 2 x daily - 7 x weekly - 1 sets - 10 reps - 3 hold - Seated Scapular Retraction  - 2 x daily - 7 x weekly - 1 sets - 10 reps - 3 hold - Seated Shoulder Flexion Towel Slide at Table Top  - 2 x daily - 7 x weekly - 1 sets - 10 reps - 5 hold - Seated Shoulder External Rotation PROM on Table  - 2 x daily - 7 x weekly - 1 sets - 10 reps - 5 hold - Shoulder Flexion Wall Slide with Towel  - 2 x daily - 7 x weekly - 1 sets - 10 reps - 3 hold - Shoulder Circles on Wall with Towel  - 2 x daily - 7 x weekly - 1 sets - 10 reps - 3 hold - Isometric Shoulder External Rotation  - 1 x daily - 7 x weekly - 3 sets - 10 reps - Isometric Shoulder Extension at Wall  - 1 x daily - 7 x weekly - 3 sets - 10 reps - Isometric Shoulder Adduction  - 1 x daily - 7  x weekly - 3 sets - 10 reps - Isometric Shoulder Flexion with Ball at Wall  - 1 x daily - 7 x weekly - 3 sets - 10 reps - Isometric Shoulder Internal Rotation  - 1 x daily - 7 x weekly - 3 sets - 10 reps Access Code: [email or web address] URL: [email or web address] Date: 06/27/2024 Prepared by: Ozell Mainland  Exercises - Seated Shoulder Shrugs  - 2 x daily - 7 x weekly - 1 sets - 10 reps - 3 hold - Seated Scapular Retraction  - 2 x daily - 7 x weekly - 1 sets - 10 reps - 3 hold - Seated Shoulder Flexion Towel Slide at Table Top  - 2 x daily - 7 x weekly - 1 sets - 10 reps - 5 hold - Seated Shoulder External Rotation PROM on Table  - 2 x daily - 7 x weekly - 1 sets - 10 reps - 5 hold - Shoulder Flexion Wall Slide with Towel  - 2 x daily - 7 x weekly - 1 sets - 10 reps - 3 hold - Shoulder Circles on Wall with Towel  - 2 x daily - 7 x weekly - 1 sets - 10 reps - 3 hold  ASSESSMENT:  CLINICAL IMPRESSION: Patient is a 58 y.o. female who participated today with physical therapy treatment for right shoulder greater tubercle fracture and rotator cuff fraying, due to a fall.  Unfortunately she has fallen since. Returns to MD Wednesday for MRI results. She has progressed  some increasing her R shoulder AROM. Increase pain with lat pull downs. Cue to really push ROM with AAROM interventions. Added IT towel stretch to HEP.  OBJECTIVE IMPAIRMENTS: decreased activity tolerance, decreased endurance, decreased ROM, decreased strength, increased fascial restrictions, increased muscle spasms, impaired flexibility, impaired UE functional use, improper body mechanics, postural dysfunction, and pain.   REHAB POTENTIAL: Good  CLINICAL DECISION MAKING: Evolving/moderate complexity  EVALUATION COMPLEXITY: Moderate   GOALS: Goals reviewed with patient? Yes  SHORT TERM GOALS: Target date: 07/16/24  Independent with initial HEP Baseline: Goal status: Met 07/05/24  LONG TERM GOALS: Target date:  09/26/24  Independent with advanced HEP Baseline:  Goal status: INITIAL  2.  Decrease pain 50% Baseline:  Goal status: ongoing 07/05/24, Ongoing 07/30/24  3.  Increase AROM of the right shoulder to 150 degrees Baseline:  Goal status: INITIAL, ongoing 07/30/24  4.  Increase AROM of the right shoulder abduction to 130 degrees Baseline:  Goal status: INITIAL  5.  Be able to do hair without difficulty Baseline:  Goal status: ongoing 07/05/24, Ongoing 07/30/24  6.  Be able to dress wihtout difficulty Baseline:  Goal status: ongoing 07/05/24, Ongoing 07/30/24  PLAN:  PT FREQUENCY: 2x/week  PT DURATION: 12 weeks  PLANNED INTERVENTIONS: 97164- PT Re-evaluation, 97110-Therapeutic exercises, 97530- Therapeutic activity, 97112- Neuromuscular re-education, 97535- Self Care, [id number]- Manual therapy, [id number]- Electrical stimulation (unattended), 97035- Ultrasound, Patient/Family education, Taping, Cryotherapy, and Moist heat  PLAN FOR NEXT SESSION: slowly get her moving, has very limited ROM, she may be returning to MD with her most recent fall for additional assessment   Tanda KANDICE Sorrow, PTA,  08/20/2024, 11:49 AM

## 2024-08-24 DIAGNOSIS — M25511 Pain in right shoulder: Secondary | ICD-10-CM | POA: Diagnosis not present

## 2024-08-27 ENCOUNTER — Ambulatory Visit: Admitting: Physical Therapy

## 2024-08-30 DIAGNOSIS — F4321 Adjustment disorder with depressed mood: Secondary | ICD-10-CM | POA: Diagnosis not present

## 2024-08-30 DIAGNOSIS — F3342 Major depressive disorder, recurrent, in full remission: Secondary | ICD-10-CM | POA: Diagnosis not present

## 2024-08-30 DIAGNOSIS — F900 Attention-deficit hyperactivity disorder, predominantly inattentive type: Secondary | ICD-10-CM | POA: Diagnosis not present

## 2024-09-03 ENCOUNTER — Ambulatory Visit
Admission: RE | Admit: 2024-09-03 | Discharge: 2024-09-03 | Disposition: A | Discharge status: Home / Self Care | Source: Ambulatory Visit | Attending: Obstetrics and Gynecology | Admitting: Obstetrics and Gynecology

## 2024-09-03 DIAGNOSIS — Z800 Family history of malignant neoplasm of digestive organs: Secondary | ICD-10-CM

## 2024-09-03 DIAGNOSIS — D1803 Hemangioma of intra-abdominal structures: Secondary | ICD-10-CM | POA: Diagnosis not present

## 2024-09-03 DIAGNOSIS — N281 Cyst of kidney, acquired: Secondary | ICD-10-CM | POA: Diagnosis not present

## 2024-09-03 MED ORDER — GADOPICLENOL 0.5 MMOL/ML IV SOLN
8.0000 mL | Freq: Once | INTRAVENOUS | Status: AC | PRN
  Administered 2024-09-03: 8 mL via INTRAVENOUS

## 2024-09-13 ENCOUNTER — Ambulatory Visit: Admitting: Physical Therapy

## 2024-09-18 ENCOUNTER — Ambulatory Visit: Admitting: Physical Therapy

## 2024-09-25 DIAGNOSIS — M25511 Pain in right shoulder: Secondary | ICD-10-CM | POA: Diagnosis not present

## 2024-09-25 DIAGNOSIS — S42254G Nondisplaced fracture of greater tuberosity of right humerus, subsequent encounter for fracture with delayed healing: Secondary | ICD-10-CM | POA: Diagnosis not present
